# Patient Record
Sex: Female | Born: 1955 | Race: White | Hispanic: No | Marital: Married | Smoking: Former smoker
Health system: Southern US, Community
[De-identification: ages and names within clinical notes are randomized; demographics above are authoritative.]

## PROBLEM LIST (undated history)

## (undated) DIAGNOSIS — K219 Gastro-esophageal reflux disease without esophagitis: Secondary | ICD-10-CM

## (undated) DIAGNOSIS — G589 Mononeuropathy, unspecified: Secondary | ICD-10-CM

## (undated) DIAGNOSIS — H409 Unspecified glaucoma: Secondary | ICD-10-CM

## (undated) DIAGNOSIS — C539 Malignant neoplasm of cervix uteri, unspecified: Secondary | ICD-10-CM

## (undated) DIAGNOSIS — F419 Anxiety disorder, unspecified: Secondary | ICD-10-CM

## (undated) DIAGNOSIS — K5792 Diverticulitis of intestine, part unspecified, without perforation or abscess without bleeding: Secondary | ICD-10-CM

## (undated) DIAGNOSIS — M1990 Unspecified osteoarthritis, unspecified site: Secondary | ICD-10-CM

## (undated) HISTORY — DX: Unspecified osteoarthritis, unspecified site: M19.90

## (undated) HISTORY — PX: HAND SURGERY: SHX662

## (undated) HISTORY — DX: Gastro-esophageal reflux disease without esophagitis: K21.9

## (undated) HISTORY — DX: Unspecified glaucoma: H40.9

## (undated) HISTORY — PX: EYE SURGERY: SHX253

## (undated) HISTORY — PX: ABDOMINAL HYSTERECTOMY: SHX81

## (undated) HISTORY — PX: OTHER SURGICAL HISTORY: SHX169

---

## 2001-02-12 ENCOUNTER — Emergency Department (HOSPITAL_COMMUNITY): Admission: EM | Admit: 2001-02-12 | Discharge: 2001-02-12 | Payer: Self-pay | Admitting: Emergency Medicine

## 2001-02-12 ENCOUNTER — Encounter: Payer: Self-pay | Admitting: Emergency Medicine

## 2002-11-07 ENCOUNTER — Emergency Department (HOSPITAL_COMMUNITY): Admission: EM | Admit: 2002-11-07 | Discharge: 2002-11-07 | Payer: Self-pay | Admitting: Emergency Medicine

## 2002-11-07 ENCOUNTER — Encounter: Payer: Self-pay | Admitting: Emergency Medicine

## 2004-09-12 ENCOUNTER — Ambulatory Visit (HOSPITAL_COMMUNITY): Admission: RE | Admit: 2004-09-12 | Discharge: 2004-09-12 | Payer: Self-pay | Admitting: Obstetrics and Gynecology

## 2005-03-20 ENCOUNTER — Emergency Department (HOSPITAL_COMMUNITY): Admission: EM | Admit: 2005-03-20 | Discharge: 2005-03-20 | Payer: Self-pay | Admitting: Emergency Medicine

## 2005-06-03 ENCOUNTER — Emergency Department (HOSPITAL_COMMUNITY): Admission: EM | Admit: 2005-06-03 | Discharge: 2005-06-03 | Payer: Self-pay | Admitting: Emergency Medicine

## 2006-02-21 ENCOUNTER — Ambulatory Visit (HOSPITAL_COMMUNITY): Admission: RE | Admit: 2006-02-21 | Discharge: 2006-02-21 | Payer: Self-pay | Admitting: General Surgery

## 2007-03-11 ENCOUNTER — Encounter: Payer: Self-pay | Admitting: Obstetrics & Gynecology

## 2007-03-11 ENCOUNTER — Inpatient Hospital Stay (HOSPITAL_COMMUNITY): Admission: RE | Admit: 2007-03-11 | Discharge: 2007-03-13 | Payer: Self-pay | Admitting: Obstetrics & Gynecology

## 2007-11-08 ENCOUNTER — Emergency Department (HOSPITAL_COMMUNITY): Admission: EM | Admit: 2007-11-08 | Discharge: 2007-11-08 | Payer: Self-pay | Admitting: Emergency Medicine

## 2007-12-02 ENCOUNTER — Ambulatory Visit: Payer: Self-pay | Admitting: Internal Medicine

## 2008-01-01 ENCOUNTER — Ambulatory Visit (HOSPITAL_COMMUNITY): Admission: RE | Admit: 2008-01-01 | Discharge: 2008-01-01 | Payer: Self-pay | Admitting: Internal Medicine

## 2008-01-01 ENCOUNTER — Ambulatory Visit: Payer: Self-pay | Admitting: Internal Medicine

## 2010-10-30 NOTE — Op Note (Signed)
Allison Dickerson, Allison Dickerson              ACCOUNT NO.:  [id number]   MEDICAL RECORD NO.:  [id number]          PATIENT TYPE:  INP   LOCATION:  A310                          FACILITY:  [facility]   PHYSICIAN:  Lazaro Arms, M.D.   DATE OF BIRTH:  1955-07-08   DATE OF PROCEDURE:  03/11/2007  DATE OF DISCHARGE:                               OPERATIVE REPORT   PREOPERATIVE DIAGNOSES:  1. Pelvic pain.  2. Fibroid uterus.   POSTOPERATIVE DIAGNOSES:  1. Pelvic pain.  2. Fibroid uterus.   PROCEDURE:  TAH-BSO.   SURGEON:  Lazaro Arms, MD.   ANESTHESIA:  General endotracheal.   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  The uterus otherwise appeared to be normal.  The ovaries were  normal postmenopausal in appearance.   DESCRIPTION OF OPERATION:  The patient was taken to the operating room  and placed in the supine position where she underwent general  endotracheal anesthesia.  The vagina was prepped, a Foley catheter was  placed, the abdomen was prepped and draped in the usual sterile fashion.  A mini-lap Pfannenstiel incision was made, carried down sharply to the  rectus fascia, scored in the midline, and extended laterally.  The  fascia was taken off of the muscle superiorly and inferiorly, the  muscles were divided.  Peritoneal cavity was entered.  A medium-sized  Alexis self-retaining wound retractor was placed, the upper abdomen was  packed away, the uterine cornua were grasped, the left round ligament  was suture-ligated and cut.  The infundibulopelvic ligament on the left  was isolated, clamped, cut, and suture ligated.  The right round  ligament was suture-ligated and cut.  The infundibulopelvic ligament on  the right was isolated, clamped, cut, and double suture-ligated.  The  vesicouterine serosal flap was created and pushed down off the lower  uterine segment without difficulty.  The uterine vessels were clamped,  cut, and suture-ligated,  and the right broad ligament myoma was removed  so as not to get too lateral, and there was good hemostasis there.  Serial pedicles taken down the cervix through the cardinal ligament,  each pedicle being clamped, cut, and suture-ligated, the vagina was  cross-clamped, the specimen was removed, vaginal angle sutures were  placed, the vagina was closed with interrupted figure-of-eight sutures.  The pelvis was irrigated vigorously, all pedicles found to be  hemostatic, Interceed placed over the vaginal cuff.  The wound retractor  and the clamps were removed, the muscles and peritoneum reapproximated  loosely, the fascia closed using #0 Vicryl running, the subcutaneous  tissue was made hemostatic and irrigated.  The skin was closed using #3-  0 Vicryl in a subcuticular  fashion on a Keith needle.  A Dermabond was placed over for bandage and  additional skin reapproximation.  The patient was awakened from  anesthesia and taken to the recovery room in good, stable condition.  All counts were correct.  She received Ancef prophylactically.      Lazaro Arms, M.D.  Electronically Signed     LHE/MEDQ  D:  03/11/2007  T:  03/11/2007  Job:  [id number]

## 2010-10-30 NOTE — Op Note (Signed)
Allison Dickerson, Allison Dickerson              ACCOUNT NO.:  [id number]   MEDICAL RECORD NO.:  [id number]          PATIENT TYPE:  AMB   LOCATION:  DAY                           FACILITY:  [facility]   PHYSICIAN:  R. Roetta Sessions, M.D. DATE OF BIRTH:  [birth year]   DATE OF PROCEDURE:  01/01/2008  DATE OF DISCHARGE:                               OPERATIVE REPORT   INDICATIONS FOR PROCEDURE:  A 55 year old lady with history of  refractory reflux symptoms.  She has failed multiple acid suppressant  agents; however, she was switched to Aciphex 20 mg orally daily when  seen her in our office back on December 02, 2007, this has been associated  with marked dramatic improvement in her symptoms.  She has never had her  lower GI tract evaluated.  There was no family history of colorectal  neoplasia.  She has never had a colonoscopy.  EGD colonoscopy is now  being done.  This approach was discussed with the patient at length.  Risks, benefits, alternatives, and limitations have been reviewed,  questions answered.  She is agreeable.  Please see documentation in  medical record.   PROCEDURE NOTE:  O2 saturation, blood pressure, pulse, and respirations  were monitored throughout the entirety of the procedures.   CONSCIOUS SEDATION:  Versed 8 mg IV and Demerol 150 mg IV in divided  doses.  Cetacaine spray for topical oropharyngeal anesthesia.   INSTRUMENT:  Pentax video chip system.   FINDINGS:  EGD examination of tubular esophagus revealed no mucosal  abnormalities.  EG junction easily traversed.  The stomach:  Gastric  cavity was emptied and insufflated well with air.  Thorough examination  of the gastric mucosa including retroflex view of the proximal stomach  and esophagogastric junction demonstrated newly small hiatal hernia.  Pylorus patent and easily traversed.  Examination of the bulb and second  portion revealed no abnormalities.   THERAPEUTIC/DIAGNOSTIC MANEUVERS PERFORMED:  None.   The patient  tolerated the procedure well and was prepared for  colonoscopy.  Digital rectal exam revealed no abnormalities.  Endoscopic  findings:  The prep was adequate.  Colon:  Colonic mucosa was surveyed  from the rectosigmoid junction through the left transverse, right colon  to the appendiceal orifice, ileocecal valve, and cecum.  These  structures well seen and photographed for the record.  From this level,  scope was slowly cautiously withdrawn.  All previously mentioned mucosal  surfaces were again seen.  The colonic mucosa appeared entirely normal.  The scope was pulled down to the rectum with examination of rectal  mucosa, including retroflexed view of the anal verge, demonstrated no  abnormalities.  The patient tolerated the above procedures well and was  reacted in Endoscopy.   IMPRESSION:  Normal esophagus, small hiatal hernia, otherwise normal  stomach D1 and D2.  The colonoscopy findings, normal rectum.   RECOMMENDATIONS:  1. Literature on reflux and a hiatal hernia provided to Ms. Raisch.      Continue Aciphex 20 mg orally daily.  2. Repeat screening colonoscopy 10 years.  3. Followup appointment with me in 6 months.  Jonathon Bellows, M.D.  Electronically Signed     RMR/MEDQ  D:  01/01/2008  T:  01/01/2008  Job:  [id number]   cc:   Jeoffrey Massed, MD  Fax: [phone]

## 2010-10-30 NOTE — H&P (Signed)
Dickerson, Allison              ACCOUNT NO.:  [id number]   MEDICAL RECORD NO.:  [id number]          PATIENT TYPE:  AMB   LOCATION:  DAY                           FACILITY:  [facility]   PHYSICIAN:  Lazaro Arms, M.D.   DATE OF BIRTH:  [birth year]   DATE OF ADMISSION:  03/11/2007  DATE OF DISCHARGE:  09/26/2008LH                              HISTORY & PHYSICAL   Allison Dickerson is a 55 year old white female, gravida 1 para 1 aborta 0, who  had been seen in the office as recently as September of 2007, at which  time she was on examination known to have fibroids which were about 10-  weeks size.  She also has had in the past peri-menstrual bleeding but is  now post menopausal.  She was having a large amount of abdominal pain  and saw Dr. Lovell Sheehan because she thought it was GI in the left lower  quadrant and had a CT scan which showed the fibroids.  The patient and I  discussed options and really because of her pain and the fibroids to be  sure there were not any other options, is set to go for definitive  therapy with hysterectomy.  She understands and agrees with that  assessment and we will proceed.   PAST MEDICAL HISTORY:  Significant for a cold knife cone back in 1990.  She had carcinoma in situ.  In 1997 she had a broken ankle, and of course she has also had the cold  knife cone.   REVIEW OF SYSTEMS:  Negative.  She has had a vaginal delivery.   ALLERGIES:  None.   MEDICATIONS:  Lorazepam p.r.n.   PHYSICAL EXAMINATION:  HEENT:  Unremarkable.  CHEST:  Clear.  HEART:  Regular rate and rhythm, without murmurs, rubs or gallops.  BREAST:  Without mass, discharge, or skin changes.  ABDOMEN:  Benign, without hepatosplenomegaly or masses.  GU:  She has normal external genitalia.  Vagina pink and moist without  discharge.  Cervix is parous without lesions.  Her last Pap smear was  normal.  Uterus is 10-week size.  Fibroids palpated, adnexa is negative.  EXTREMITIES:  No edema.  NEUROLOGIC:   Grossly intact.   IMPRESSION:  1. Pelvic pain.  2. Fibroids.   PLAN:  The patient is admitted for TAHBSO.  She understands the risks  and benefits, indications and alternatives.  We will proceed.      Lazaro Arms, M.D.  Electronically Signed     LHE/MEDQ  D:  03/10/2007  T:  03/10/2007  Job:  [id number]

## 2010-10-30 NOTE — Consult Note (Signed)
NAMEYESICA, KEMLER              ACCOUNT NO.:  [id number]   MEDICAL RECORD NO.:  [id number]          PATIENT TYPE:  AMB   LOCATION:  DAY                           FACILITY:  [facility]   PHYSICIAN:  R. Roetta Sessions, M.D. DATE OF BIRTH:  03/17/1956   DATE OF CONSULTATION:  DATE OF DISCHARGE:  11/08/2007                                 CONSULTATION   REASON FOR CONSULTATION:  Refractory heartburn/atypical chest pain and  throat pain.   HISTORY OF PRESENT ILLNESS:  Ms. Bonfiglio is a 55 year old Caucasian  female.  She complains of a constant fire points to her upper chest  and neck.  She tells me this pain never goes away.  It feels as though  it is a pressure as well.  She has tried omeprazole 20 mg b.i.d., as  well as ranitidine 150 mg b.i.d. concomitantly, which has not helped.  She admits to taking some of her husband's Aciphex 20 mg daily and says  that she gets the most relief from this.  She has been on the Zantac and  omeprazole for about 3 months now.  Her symptoms are usually worse first  thing in the morning.  She occasionally awakens while sleeping with  symptoms nocturnally.  She does have severe water brash.  She tells me  she has severe halitosis and her water brash tastes like this.  She  complains of nausea.  She denies any vomiting.  Her symptoms have been  worse since she has been on estrogen replacement, status post  hysterectomy.  She does occasionally have some epigastric discomfort as  well.  Her throat pain never goes away.  Occasionally, she does have  symptoms, which radiate through to her back.  She has had constipation  the last couple of days.  Denies any history of diarrhea, rectal  bleeding, or melena.  She does have chills, but denies any fever.  She  denies any history of hot flash.  Her weight has steadily increased.  She was on antibiotics about 4 months ago.   PAST MEDICAL HISTORY AND PAST SURGICAL HISTORY:  Glaucoma, anxiety,  and  gastroesophageal  reflux disease.  Complete hysterectomy in September  2008 for uterine fibroids.   CURRENT MEDICATIONS:  1. Ranitidine 150 mg b.i.d.  2. Omeprazole 20 mg daily to b.i.d.  3. Estradiol 0.5 mg daily.  4. Lorazepam 2 mg t.i.d. p.r.n.   ALLERGIES:  No known drug allergies.   FAMILY HISTORY:  No known family history of water brash or liver  problems.  Her father was diagnosed and later passed away of pancreatic  carcinoma at age 50.  Mother is 109 and healthy.  She has 2 healthy  siblings.   SOCIAL HISTORY:  Ms. Payson is married.  She has one 29 year old son.  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  She  has a remote history of tobacco use, quit 1 month ago, generally  consumes anywhere between one or couple of cigarettes per day  previously.  Denies any alcohol or drug use.   REVIEW OF SYSTEMS:  See  HPI.  HEENT:  She does complain of frequent sore throat, frequent throat  clearing.  No history of laryngitis.  PULMONARY:  She does have cough with at times productive green sputum.  She does have shortness of breath while resting, usually improves with  exertion.   PHYSICAL EXAMINATION:  VITAL SIGNS:  Weight 125 pounds, height 66  inches, temperature 98.7, blood pressure 100/64, and pulse 84.  GENERAL:  Ms. Osuna is a well-developed, well-nourished Caucasian  female in no acute distress.  HEENT:  Sclerae clear and anicteric.  Conjunctivae pink.  OROPHARYNX:  Pink and moist without lesions.  NECK:  Supple without any masses or thyromegaly.  HEART:  Regular rhythm.  Normal S1 S2.  No murmurs, rubs, clicks, or  gallops.  LUNGS:  Clear to auscultation bilaterally.  ABDOMEN:  Positive bowel sounds x4.  No bruits auscultated.  Soft,  nontender, non-distended without palpable mass or hepatosplenomegaly.  No tenderness or guarding.  EXTREMITIES:  Without clubbing or edema bilaterally.  SKIN:  Pink, warm, and dry without any rash or jaundice.   IMPRESSION:  Ms.  Sefcik is a 55 year old Caucasian female with an  atypical chest pain, refractory heartburn, water brash, pharyngeal pain,  and cough.  I suspect some of her symptoms could be due to poorly  controlled gastroesophageal reflux disease or erosive esophagitis.  Recent antibiotic use make symptoms suspicious for Candida esophagitis  all of which needs to be ruled out via EGD.  If etiology of her symptoms  are unclear, would pursue further workup via ENT as well as further  workup of her pulmonary symptoms.   She has never had a screening colonoscopy, and this can be done at the  same time of EGD.   PLAN:  1. EGD and screening colonoscopy with Dr. Jena Gauss in the near future.      Discussed the procedure including risks and benefits, which include      but not limited to bleeding, infection, perforation, and drug      reaction.  She agrees and informed consent was obtained.  2. Discontinue ranitidine and omeprazole.  3. Begin Aciphex 20 mg daily.  I have given her a box of samples and a      prescription for #30 with 2 refills.      Lorenza Burton, N.P.      Jonathon Bellows, M.D.  Electronically Signed    KJ/MEDQ  D:  12/02/2007  T:  12/03/2007  Job:  [id number]

## 2010-10-30 NOTE — Discharge Summary (Signed)
NAMESHACOYA, BURKHAMMER              ACCOUNT NO.:  [id number]   MEDICAL RECORD NO.:  [id number]          PATIENT TYPE:  INP   LOCATION:  A310                          FACILITY:  [facility]   PHYSICIAN:  Lazaro Arms, M.D.   DATE OF BIRTH:  October 02, 1955   DATE OF ADMISSION:  03/11/2007  DATE OF DISCHARGE:  09/26/2008LH                               DISCHARGE SUMMARY   DISCHARGE DIAGNOSES:  1. Status post total abdominal hysterectomy/bilateral salpingo-      oophorectomy.  2. Unremarkable postoperative course.   PROCEDURE:  TAH/BSO.   HISTORY AND HOSPITAL COURSE:  Please refer to the history and physical,  the charts from the hospital as well as the operative report.   The patient was admitted after surgery.  She tolerated clear liquids and  a regular diet.  She voided without symptoms.  She is kind of focused on  her GI function.  She was passing gas, having bowel movement but she is  evidently very focused on it at home at well.  In any event, she  responded to suppositories and enemas.  She is ambulatory.  Her incision  is clean, dry and intact.  Her abdominal exam is completely benign.  She  is discharged to home the morning of postoperative day #2, doing well on  Percocet, Motrin and Cipro.  She did have a temperature of 100.2.  She  does smoke, is having some cough.  No atelectasis on exam.  We will see  her back in the office on Wednesday.      Lazaro Arms, M.D.  Electronically Signed     LHE/MEDQ  D:  03/13/2007  T:  03/13/2007  Job:  [id number]

## 2011-03-28 LAB — CROSSMATCH

## 2011-03-28 LAB — CBC
HCT: 28.6 — ABNORMAL LOW
HCT: 32.1 — ABNORMAL LOW
Hemoglobin: 9.9 — ABNORMAL LOW
MCHC: 34.2
MCV: 91.6
Platelets: 200
Platelets: 246
RBC: 3.12 — ABNORMAL LOW
RDW: 12.9
WBC: 15.4 — ABNORMAL HIGH
WBC: 8.4

## 2011-03-28 LAB — DIFFERENTIAL
Eosinophils Absolute: 0
Eosinophils Relative: 1
Lymphocytes Relative: 18
Lymphocytes Relative: 4 — ABNORMAL LOW
Lymphs Abs: 0.6 — ABNORMAL LOW
Lymphs Abs: 1.5
Monocytes Relative: 8
Neutro Abs: 13.8 — ABNORMAL HIGH
Neutrophils Relative %: 90 — ABNORMAL HIGH

## 2011-03-28 LAB — URINALYSIS, ROUTINE W REFLEX MICROSCOPIC
Glucose, UA: NEGATIVE
Nitrite: NEGATIVE
Protein, ur: NEGATIVE
Urobilinogen, UA: 0.2

## 2011-03-28 LAB — COMPREHENSIVE METABOLIC PANEL
ALT: 26
AST: 26
Calcium: 9.7
GFR calc Af Amer: >60
Sodium: 139
Total Protein: 6.6

## 2011-03-28 LAB — HCG, QUANTITATIVE, PREGNANCY: hCG, Beta Chain, Quant, S: 5 — ABNORMAL HIGH

## 2011-10-10 ENCOUNTER — Emergency Department (HOSPITAL_COMMUNITY)
Admission: EM | Admit: 2011-10-10 | Discharge: 2011-10-10 | Disposition: A | Discharge status: Home / Self Care | Payer: Self-pay | Attending: Emergency Medicine | Admitting: Emergency Medicine

## 2011-10-10 ENCOUNTER — Emergency Department (HOSPITAL_COMMUNITY): Payer: Self-pay

## 2011-10-10 ENCOUNTER — Encounter (HOSPITAL_COMMUNITY): Payer: Self-pay | Admitting: Emergency Medicine

## 2011-10-10 DIAGNOSIS — R51 Headache: Secondary | ICD-10-CM | POA: Insufficient documentation

## 2011-10-10 DIAGNOSIS — R42 Dizziness and giddiness: Secondary | ICD-10-CM | POA: Insufficient documentation

## 2011-10-10 DIAGNOSIS — F17200 Nicotine dependence, unspecified, uncomplicated: Secondary | ICD-10-CM | POA: Insufficient documentation

## 2011-10-10 HISTORY — DX: Malignant neoplasm of cervix uteri, unspecified: C53.9

## 2011-10-10 LAB — COMPREHENSIVE METABOLIC PANEL
AST: 21 U/L (ref 0–37)
Albumin: 3.8 g/dL (ref 3.5–5.2)
BUN: 16 mg/dL (ref 6–23)
CO2: 27 mEq/L (ref 19–32)
Calcium: 10.2 mg/dL (ref 8.4–10.5)
Creatinine, Ser: 0.94 mg/dL (ref 0.50–1.10)
GFR calc non Af Amer: 66 mL/min — ABNORMAL LOW (ref >90)
Total Bilirubin: 0.4 mg/dL (ref 0.3–1.2)

## 2011-10-10 LAB — CBC
HCT: 36.6 % (ref 36.0–46.0)
MCH: 31.5 pg (ref 26.0–34.0)
MCV: 93.6 fL (ref 78.0–100.0)
Platelets: 211 10*3/uL (ref 150–400)
RDW: 12.2 % (ref 11.5–15.5)

## 2011-10-10 LAB — URINALYSIS, ROUTINE W REFLEX MICROSCOPIC
Hgb urine dipstick: NEGATIVE
Leukocytes, UA: NEGATIVE
Nitrite: NEGATIVE
Protein, ur: NEGATIVE mg/dL
Specific Gravity, Urine: 1.015 (ref 1.005–1.030)
Urobilinogen, UA: 0.2 mg/dL (ref 0.0–1.0)

## 2011-10-10 LAB — TROPONIN I: Troponin I: <0.3 ng/mL (ref <0.30)

## 2011-10-10 MED ORDER — DOXYCYCLINE HYCLATE 100 MG PO CAPS: 100.0000 mg | ORAL_CAPSULE | Freq: Two times a day (BID) | ORAL | Status: AC

## 2011-10-10 MED ORDER — SODIUM CHLORIDE 0.9 % IV SOLN
INTRAVENOUS | Status: DC
  Administered 2011-10-10: 100 mL via INTRAVENOUS

## 2011-10-10 MED ORDER — SODIUM CHLORIDE 0.9 % IV BOLUS (SEPSIS)
250.0000 mL | Freq: Once | INTRAVENOUS | Status: AC
  Administered 2011-10-10: 250 mL via INTRAVENOUS

## 2011-10-10 NOTE — Discharge Instructions (Signed)
Workup negative in the emergency department. Will treat for possibly early lines disease with antibiotic take as tract in until finished. Followup with your record Dr. If not improved in 2 days return for any new or worse symptoms.

## 2011-10-10 NOTE — ED Notes (Signed)
Patient with c/o dizziness and headache that started about 2 days ago with neck pain. Patient reports she feels "off". Patient alert/oriented x 4. States she feels like she is going to pass out. Reports insect bite to left neck at base of skull after doing yard work.

## 2011-10-10 NOTE — ED Provider Notes (Signed)
History     CSN: [id number]  Arrival date & time 10/10/11  1336   First MD Initiated Contact with Patient 10/10/11 1355      Chief Complaint  Patient presents with  . Dizziness  . Headache    (Consider location/radiation/quality/duration/timing/severity/associated sxs/prior treatment) The history is provided by the patient and the spouse.  patient is a 56 year old female with 2 day history of headache dizziness and feeling like she may pass out. Patient did have a insect bite or tick bite to the left neck the base of the skull after doing yard work a few days ago. She has some hardness there and some tenderness there. Patient denies fever denies sore throat cough congestion chest pain shortness of breath or any neurological findings nausea vomiting diarrhea or abdominal pain.  Past Medical History  Diagnosis Date  . Cervical cancer     When younger.    Past Surgical History  Procedure Date  . Abdominal hysterectomy   . Cervical cancer removal     No family history on file.  History  Substance Use Topics  . Smoking status: Current Everyday Smoker -- 0.5 packs/day  . Smokeless tobacco: Not on file  . Alcohol Use: Yes     occasionally    OB History    Grav Para Term Preterm Abortions TAB SAB Ect Mult Living                  Review of Systems  Constitutional: Negative for fever, chills and diaphoresis.  HENT: Positive for neck pain and neck stiffness. Negative for congestion, sore throat and sinus pressure.   Eyes: Negative for redness and visual disturbance.  Respiratory: Negative for cough and shortness of breath.   Cardiovascular: Negative for chest pain, palpitations and leg swelling.  Gastrointestinal: Negative for nausea, vomiting, abdominal pain and diarrhea.  Genitourinary: Negative for dysuria.  Musculoskeletal: Negative for myalgias and back pain.  Skin: Negative for rash.  Neurological: Positive for dizziness, light-headedness and headaches. Negative  for tremors, seizures, syncope, speech difficulty, weakness and numbness.  Hematological: Does not bruise/bleed easily.    Allergies  Review of patient's allergies indicates no known allergies.  Home Medications   Current Outpatient Rx  Name Route Sig Dispense Refill  . LORAZEPAM 0.5 MG PO TABS Oral Take 0.5 mg by mouth every 8 (eight) hours.    Marland Kitchen DOXYCYCLINE HYCLATE 100 MG PO CAPS Oral Take 1 capsule (100 mg total) by mouth 2 (two) times daily. 20 capsule 0    BP 111/69  Pulse 67  Temp(Src) 98 F (36.7 C) (Oral)  Resp 18  Ht 5\' 7"  (1.702 m)  Wt 120 lb (54.432 kg)  BMI 18.79 kg/m2  SpO2 100%  Physical Exam  Nursing note and vitals reviewed. Constitutional: She is oriented to person, place, and time. She appears well-developed and well-nourished.  HENT:  Head: Normocephalic and atraumatic.  Mouth/Throat: Oropharynx is clear and moist.  Eyes: Conjunctivae and EOM are normal. Pupils are equal, round, and reactive to light.  Neck: Normal range of motion. Neck supple.       Left base of neck with area of insect bite with about 1 cm of induration no erythema no purulent discharge no cervical adenopathy. Could be consistent with an insect bite. No evidence of remnant of the body.  Cardiovascular: Normal rate, regular rhythm, normal heart sounds and intact distal pulses.   No murmur heard. Pulmonary/Chest: Effort normal and breath sounds normal. No respiratory distress.  Abdominal:  Soft. Bowel sounds are normal. There is no tenderness.  Musculoskeletal: Normal range of motion. She exhibits no edema and no tenderness.  Lymphadenopathy:    She has no cervical adenopathy.  Neurological: She is oriented to person, place, and time. No cranial nerve deficit. She exhibits normal muscle tone. Coordination normal.  Skin: Skin is warm. No rash noted. No erythema.    ED Course  Procedures (including critical care time)  Labs Reviewed  COMPREHENSIVE METABOLIC PANEL - Abnormal; Notable  for the following:    Glucose, Bld 112 (*)    GFR calc non Af Amer 66 (*)    GFR calc Af Amer 77 (*)    All other components within normal limits  CBC  URINALYSIS, ROUTINE W REFLEX MICROSCOPIC  TROPONIN I   Results for orders placed during the hospital encounter of 10/10/11  CBC      Component Value Range   WBC 7.1  4.0 - 10.5 (K/uL)   RBC 3.91  3.87 - 5.11 (MIL/uL)   Hemoglobin 12.3  12.0 - 15.0 (g/dL)   HCT 69.6  29.5 - 28.4 (%)   MCV 93.6  78.0 - 100.0 (fL)   MCH 31.5  26.0 - 34.0 (pg)   MCHC 33.6  30.0 - 36.0 (g/dL)   RDW 13.2  44.0 - 10.2 (%)   Platelets 211  150 - 400 (K/uL)  COMPREHENSIVE METABOLIC PANEL      Component Value Range   Sodium 138  135 - 145 (mEq/L)   Potassium 3.9  3.5 - 5.1 (mEq/L)   Chloride 103  96 - 112 (mEq/L)   CO2 27  19 - 32 (mEq/L)   Glucose, Bld 112 (*) 70 - 99 (mg/dL)   BUN 16  6 - 23 (mg/dL)   Creatinine, Ser 7.25  0.50 - 1.10 (mg/dL)   Calcium 36.6  8.4 - 10.5 (mg/dL)   Total Protein 6.8  6.0 - 8.3 (g/dL)   Albumin 3.8  3.5 - 5.2 (g/dL)   AST 21  0 - 37 (U/L)   ALT 24  0 - 35 (U/L)   Alkaline Phosphatase 46  39 - 117 (U/L)   Total Bilirubin 0.4  0.3 - 1.2 (mg/dL)   GFR calc non Af Amer 66 (*) >90 (mL/min)   GFR calc Af Amer 77 (*) >90 (mL/min)  URINALYSIS, ROUTINE W REFLEX MICROSCOPIC      Component Value Range   Color, Urine YELLOW  YELLOW    APPearance CLEAR  CLEAR    Specific Gravity, Urine 1.015  1.005 - 1.030    pH 5.5  5.0 - 8.0    Glucose, UA NEGATIVE  NEGATIVE (mg/dL)   Hgb urine dipstick NEGATIVE  NEGATIVE    Bilirubin Urine NEGATIVE  NEGATIVE    Ketones, ur NEGATIVE  NEGATIVE (mg/dL)   Protein, ur NEGATIVE  NEGATIVE (mg/dL)   Urobilinogen, UA 0.2  0.0 - 1.0 (mg/dL)   Nitrite NEGATIVE  NEGATIVE    Leukocytes, UA NEGATIVE  NEGATIVE   TROPONIN I      Component Value Range   Troponin I <0.30  <0.30 (ng/mL)    Date: 10/10/2011  Rate: 71  Rhythm: normal sinus rhythm  QRS Axis: normal  Intervals: normal  ST/T Wave  abnormalities: normal  Conduction Disutrbances:none  Narrative Interpretation:   Old EKG Reviewed: none available   Dg Chest 2 View  10/10/2011  *RADIOLOGY REPORT*  Clinical Data: Near-syncope.  Dizziness  CHEST - 2 VIEW  Comparison: 03/20/2005  Findings: The heart size and mediastinal contours are within normal limits.  Both lungs are clear.  The visualized skeletal structures are unremarkable.  IMPRESSION: Negative exam.  Original Report Authenticated By: Rosealee Albee, M.D.   Ct Head Wo Contrast  10/10/2011  *RADIOLOGY REPORT*  Clinical Data: Dizziness and headache for 2 days.  CT HEAD WITHOUT CONTRAST  Technique:  Contiguous axial images were obtained from the base of the skull through the vertex without contrast.  Comparison: None.  Findings: The brain appears normal without evidence of infarction, hemorrhage, mass lesion, mass effect, midline shift or abnormal extra-axial fluid collection.  No hydrocephalus or pneumocephalus. Mild mucosal thickening right sphenoid sinus is noted.  There is also a small amount of fluid in the left mastoid air cells.  IMPRESSION: No acute intracranial abnormality.  Original Report Authenticated By: Bernadene Bell. D'ALESSIO, M.D.     1. Dizziness       MDM      Workup for symptoms without specific findings. Patient did have some sort of bug bite questionable tick bite to the left side of her neck a few days ago. Does have a slight bit of induration and tenderness there. Workup for near-syncope without evidence of head CT abnormality troponin was normal EKG without acute changes chest x-ray negative lab workup completely normal to include liver function tests. Symptoms could be early Lyme's will treat with doxycycline. Patient knows to return for any new or worse symptoms and to followup with her primary care Dr. If not better in 2 days.dizziness not consistent with true vertigo.    Shelda Jakes, MD 10/10/11 1640

## 2012-08-15 ENCOUNTER — Encounter (HOSPITAL_COMMUNITY): Payer: Self-pay | Admitting: Emergency Medicine

## 2012-08-15 ENCOUNTER — Emergency Department (HOSPITAL_COMMUNITY)
Admission: EM | Admit: 2012-08-15 | Discharge: 2012-08-15 | Disposition: A | Discharge status: Home / Self Care | Payer: Self-pay | Attending: Emergency Medicine | Admitting: Emergency Medicine

## 2012-08-15 DIAGNOSIS — Z8541 Personal history of malignant neoplasm of cervix uteri: Secondary | ICD-10-CM | POA: Insufficient documentation

## 2012-08-15 DIAGNOSIS — M25531 Pain in right wrist: Secondary | ICD-10-CM

## 2012-08-15 DIAGNOSIS — M79641 Pain in right hand: Secondary | ICD-10-CM

## 2012-08-15 DIAGNOSIS — M25539 Pain in unspecified wrist: Secondary | ICD-10-CM | POA: Insufficient documentation

## 2012-08-15 DIAGNOSIS — M7989 Other specified soft tissue disorders: Secondary | ICD-10-CM | POA: Insufficient documentation

## 2012-08-15 DIAGNOSIS — M79609 Pain in unspecified limb: Secondary | ICD-10-CM | POA: Insufficient documentation

## 2012-08-15 DIAGNOSIS — F17200 Nicotine dependence, unspecified, uncomplicated: Secondary | ICD-10-CM | POA: Insufficient documentation

## 2012-08-15 DIAGNOSIS — R209 Unspecified disturbances of skin sensation: Secondary | ICD-10-CM | POA: Insufficient documentation

## 2012-08-15 MED ORDER — HYDROCODONE-ACETAMINOPHEN 5-325 MG PO TABS: 1.0000 | ORAL_TABLET | ORAL | Status: DC | PRN

## 2012-08-15 MED ORDER — HYDROCODONE-ACETAMINOPHEN 5-325 MG PO TABS
1.0000 | ORAL_TABLET | Freq: Once | ORAL | Status: AC
  Administered 2012-08-15: 1 via ORAL
  Filled 2012-08-15: qty 1

## 2012-08-15 MED ORDER — PREDNISONE 10 MG PO TABS: 20.0000 mg | ORAL_TABLET | Freq: Every day | ORAL | Status: DC

## 2012-08-15 MED ORDER — PREDNISONE 50 MG PO TABS
60.0000 mg | ORAL_TABLET | Freq: Once | ORAL | Status: AC
  Administered 2012-08-15: 60 mg via ORAL
  Filled 2012-08-15: qty 1

## 2012-08-15 NOTE — ED Notes (Signed)
Pt complains of pain, numbness and swelling to her right hand, states the pain has been going on for 2 days and has been getting worse. Moderate swelling noted to the affected hand, pt denies injury. Pt aax4 nad noted.

## 2012-08-15 NOTE — ED Provider Notes (Signed)
History     CSN: [id number]  Arrival date & time 08/15/12  1478   First MD Initiated Contact with Patient 08/15/12 [phone]      Chief Complaint  Patient presents with  . Hand Pain  . Arm Pain    (Consider location/radiation/quality/duration/timing/severity/associated sxs/prior treatment) HPI Allison Dickerson is a 57 y.o. female who presents to the Emergency Department complaining of pain , some numbness and swelling to her right hand with pain to the anterior wrist area x 2 days after vacuuming using an older vacuum. Patient has arthritis and has not had swelling or pain this bad before.  PCP Dr. Milford Cage  Past Medical History  Diagnosis Date  . Cervical cancer     When younger.    Past Surgical History  Procedure Laterality Date  . Abdominal hysterectomy    . Cervical cancer removal      History reviewed. No pertinent family history.  History  Substance Use Topics  . Smoking status: Current Every Day Smoker -- 0.50 packs/day  . Smokeless tobacco: Not on file  . Alcohol Use: Yes     Comment: occasionally    OB History   Grav Para Term Preterm Abortions TAB SAB Ect Mult Living                  Review of Systems  Constitutional: Negative for fever.       10 Systems reviewed and are negative for acute change except as noted in the HPI.  HENT: Negative for congestion.   Eyes: Negative for discharge and redness.  Respiratory: Negative for cough and shortness of breath.   Cardiovascular: Negative for chest pain.  Gastrointestinal: Negative for vomiting and abdominal pain.  Musculoskeletal: Negative for back pain.       Wrist and hand swelling and pain  Skin: Negative for rash.  Neurological: Negative for syncope, numbness and headaches.  Psychiatric/Behavioral:       No behavior change.    Allergies  Review of patient's allergies indicates no known allergies.  Home Medications   Current Outpatient Rx  Name  Route  Sig  Dispense  Refill  . LORazepam (ATIVAN)  0.5 MG tablet   Oral   Take 0.5 mg by mouth every 8 (eight) hours.           BP 121/76  Pulse 71  Temp(Src) 97.9 F (36.6 C) (Oral)  Resp 18  Ht 5\' 7"  (1.702 m)  Wt 125 lb (56.7 kg)  BMI 19.57 kg/m2  SpO2 100%  Physical Exam  Nursing note and vitals reviewed. Constitutional:  Awake, alert, nontoxic appearance.  HENT:  Head: Normocephalic and atraumatic.  Right Ear: External ear normal.  Left Ear: External ear normal.  Eyes: EOM are normal. Pupils are equal, round, and reactive to light.  Neck: Neck supple.  Cardiovascular: Normal rate.   Pulmonary/Chest: Effort normal and breath sounds normal. She exhibits no tenderness.  Abdominal: Soft. Bowel sounds are normal. There is no tenderness. There is no rebound.  Musculoskeletal: She exhibits no tenderness.  Right hand with numbness to the finger tips with light touch testing. Swelling and pain to the carpel tunnel area. Tenderness with palpation of the anterior wrist. Cap refill brisk.  Neurological:  Mental status and motor strength appears baseline for patient and situation.  Skin: No rash noted.  Psychiatric: She has a normal mood and affect.    ED Course  Procedures (including critical care time)     MDM  Patient  with swelling and numbness due to use of the right hand and wrist. Given prednisone, vicodin and placed in a wrist splint. Referral to ortho. Pt stable in ED with no significant deterioration in condition.The patient appears reasonably screened and/or stabilized for discharge and I doubt any other medical condition or other [facility] requiring further screening, evaluation, or treatment in the ED at this time prior to discharge.  MDM Reviewed: nursing note and vitals           Nicoletta Dress. Colon Stann, MD 08/15/12 [phone]

## 2013-05-27 ENCOUNTER — Emergency Department (HOSPITAL_COMMUNITY)
Admission: EM | Admit: 2013-05-27 | Discharge: 2013-05-27 | Disposition: A | Discharge status: Home / Self Care | Payer: Self-pay | Attending: Emergency Medicine | Admitting: Emergency Medicine

## 2013-05-27 ENCOUNTER — Emergency Department (HOSPITAL_COMMUNITY): Payer: Self-pay

## 2013-05-27 ENCOUNTER — Encounter (HOSPITAL_COMMUNITY): Payer: Self-pay | Admitting: Emergency Medicine

## 2013-05-27 DIAGNOSIS — Z8541 Personal history of malignant neoplasm of cervix uteri: Secondary | ICD-10-CM | POA: Insufficient documentation

## 2013-05-27 DIAGNOSIS — R5381 Other malaise: Secondary | ICD-10-CM | POA: Insufficient documentation

## 2013-05-27 DIAGNOSIS — R141 Gas pain: Secondary | ICD-10-CM | POA: Insufficient documentation

## 2013-05-27 DIAGNOSIS — R142 Eructation: Secondary | ICD-10-CM | POA: Insufficient documentation

## 2013-05-27 DIAGNOSIS — K5732 Diverticulitis of large intestine without perforation or abscess without bleeding: Secondary | ICD-10-CM | POA: Insufficient documentation

## 2013-05-27 DIAGNOSIS — K5900 Constipation, unspecified: Secondary | ICD-10-CM | POA: Insufficient documentation

## 2013-05-27 DIAGNOSIS — F17200 Nicotine dependence, unspecified, uncomplicated: Secondary | ICD-10-CM | POA: Insufficient documentation

## 2013-05-27 DIAGNOSIS — IMO0002 Reserved for concepts with insufficient information to code with codable children: Secondary | ICD-10-CM | POA: Insufficient documentation

## 2013-05-27 LAB — HEPATIC FUNCTION PANEL
Albumin: 3.7 g/dL (ref 3.5–5.2)
Alkaline Phosphatase: 59 U/L (ref 39–117)
Total Protein: 7 g/dL (ref 6.0–8.3)

## 2013-05-27 LAB — URINALYSIS, ROUTINE W REFLEX MICROSCOPIC
Ketones, ur: NEGATIVE mg/dL
Leukocytes, UA: NEGATIVE
Protein, ur: NEGATIVE mg/dL
Urobilinogen, UA: 0.2 mg/dL (ref 0.0–1.0)

## 2013-05-27 LAB — CBC WITH DIFFERENTIAL/PLATELET
Basophils Absolute: 0 10*3/uL (ref 0.0–0.1)
Basophils Relative: 0 % (ref 0–1)
Eosinophils Relative: 1 % (ref 0–5)
Lymphocytes Relative: 10 % — ABNORMAL LOW (ref 12–46)
MCHC: 34.2 g/dL (ref 30.0–36.0)
MCV: 94.8 fL (ref 78.0–100.0)
Platelets: 255 10*3/uL (ref 150–400)
RDW: 12.4 % (ref 11.5–15.5)
WBC: 11.1 10*3/uL — ABNORMAL HIGH (ref 4.0–10.5)

## 2013-05-27 LAB — BASIC METABOLIC PANEL
CO2: 25 mEq/L (ref 19–32)
Calcium: 9.6 mg/dL (ref 8.4–10.5)
Creatinine, Ser: 0.96 mg/dL (ref 0.50–1.10)
GFR calc Af Amer: 74 mL/min — ABNORMAL LOW (ref >90)
GFR calc non Af Amer: 63 mL/min — ABNORMAL LOW (ref >90)
Sodium: 137 mEq/L (ref 135–145)

## 2013-05-27 LAB — URINE MICROSCOPIC-ADD ON

## 2013-05-27 LAB — LIPASE, BLOOD: Lipase: 28 U/L (ref 11–59)

## 2013-05-27 MED ORDER — HYDROCODONE-ACETAMINOPHEN 5-325 MG PO TABS
2.0000 | ORAL_TABLET | Freq: Once | ORAL | Status: AC
  Administered 2013-05-27: 2 via ORAL
  Filled 2013-05-27: qty 2

## 2013-05-27 MED ORDER — METRONIDAZOLE 500 MG PO TABS
500.0000 mg | ORAL_TABLET | Freq: Once | ORAL | Status: AC
  Administered 2013-05-27: 500 mg via ORAL
  Filled 2013-05-27: qty 1

## 2013-05-27 MED ORDER — CIPROFLOXACIN HCL 500 MG PO TABS: 500.0000 mg | ORAL_TABLET | Freq: Two times a day (BID) | ORAL | Status: DC

## 2013-05-27 MED ORDER — PROMETHAZINE HCL 12.5 MG PO TABS
12.5000 mg | ORAL_TABLET | Freq: Once | ORAL | Status: AC
  Administered 2013-05-27: 12.5 mg via ORAL
  Filled 2013-05-27: qty 1

## 2013-05-27 MED ORDER — METRONIDAZOLE 500 MG PO TABS: 500.0000 mg | ORAL_TABLET | Freq: Two times a day (BID) | ORAL | Status: DC

## 2013-05-27 MED ORDER — CIPROFLOXACIN HCL 250 MG PO TABS
500.0000 mg | ORAL_TABLET | Freq: Once | ORAL | Status: AC
  Administered 2013-05-27: 500 mg via ORAL
  Filled 2013-05-27: qty 2

## 2013-05-27 MED ORDER — HYDROCODONE-ACETAMINOPHEN 7.5-325 MG PO TABS: 1.0000 | ORAL_TABLET | ORAL | Status: DC | PRN

## 2013-05-27 NOTE — ED Notes (Signed)
Pt alert & oriented x4, stable gait. Patient given discharge instructions, paperwork & prescription(s). Patient  instructed to stop at the registration desk to finish any additional paperwork. Patient verbalized understanding. Pt left department w/ no further questions. 

## 2013-05-27 NOTE — ED Notes (Signed)
Pt states lower abdominal pain (suprapubic area). States urinary frequency also. Symptoms x 3 days. States she also feels constipated. Nausea at times.

## 2013-05-27 NOTE — ED Notes (Signed)
Patient transported to X-ray 

## 2013-05-27 NOTE — ED Notes (Signed)
Pt is back from xray at this time. Allison Dickerson 

## 2013-05-27 NOTE — ED Provider Notes (Signed)
CSN: [id number]     Arrival date & time 05/27/13  9629 History   First MD Initiated Contact with Patient 05/27/13 [phone]     Chief Complaint  Patient presents with  . Abdominal Pain   (Consider location/radiation/quality/duration/timing/severity/associated sxs/prior Treatment) Patient is a 57 y.o. female presenting with abdominal pain. The history is provided by the patient.  Abdominal Pain Pain location:  Periumbilical, LLQ and RLQ Pain quality: fullness, sharp and shooting   Pain severity:  Severe Onset quality:  Gradual Duration:  3 days Timing:  Intermittent Progression:  Worsening Chronicity:  New Context: not recent travel and not suspicious food intake   Relieved by:  Nothing Ineffective treatments:  None tried Associated symptoms: fatigue, flatus and nausea   Associated symptoms: no chills, no dysuria, no fever, no hematemesis, no hematochezia, no hematuria, no shortness of breath and no vomiting   Risk factors: not obese, not pregnant and no recent hospitalization     Past Medical History  Diagnosis Date  . Cervical cancer     When younger.   Past Surgical History  Procedure Laterality Date  . Abdominal hysterectomy    . Cervical cancer removal     No family history on file. History  Substance Use Topics  . Smoking status: Current Every Day Smoker -- 0.50 packs/day    Types: Cigarettes  . Smokeless tobacco: Not on file  . Alcohol Use: Yes     Comment: occasionally   OB History   Grav Para Term Preterm Abortions TAB SAB Ect Mult Living                 Review of Systems  Constitutional: Positive for fatigue. Negative for fever and chills.  Respiratory: Negative for shortness of breath.   Gastrointestinal: Positive for nausea, abdominal pain and flatus. Negative for vomiting, hematochezia and hematemesis.  Genitourinary: Negative for dysuria and hematuria.    Allergies  Review of patient's allergies indicates no known allergies.  Home Medications    Current Outpatient Rx  Name  Route  Sig  Dispense  Refill  . HYDROcodone-acetaminophen (NORCO/VICODIN) 5-325 MG per tablet   Oral   Take 1 tablet by mouth every 4 (four) hours as needed for pain.   15 tablet   0   . LORazepam (ATIVAN) 0.5 MG tablet   Oral   Take 0.5 mg by mouth every 8 (eight) hours.         . predniSONE (DELTASONE) 10 MG tablet   Oral   Take 2 tablets (20 mg total) by mouth daily.   10 tablet   0    BP 139/61  Pulse 84  Temp(Src) 98.9 F (37.2 C) (Oral)  Resp 17  Ht 5\' 7"  (1.702 m)  Wt 130 lb (58.968 kg)  BMI 20.36 kg/m2  SpO2 99% Physical Exam  Nursing note and vitals reviewed. Constitutional: She is oriented to person, place, and time. She appears well-developed and well-nourished.  Non-toxic appearance.  HENT:  Head: Normocephalic.  Right Ear: Tympanic membrane and external ear normal.  Left Ear: Tympanic membrane and external ear normal.  Eyes: EOM and lids are normal. Pupils are equal, round, and reactive to light.  Neck: Normal range of motion. Neck supple. Carotid bruit is not present.  Cardiovascular: Normal rate, regular rhythm, normal heart sounds, intact distal pulses and normal pulses.   Pulmonary/Chest: Breath sounds normal. No respiratory distress.  Abdominal: Soft. Bowel sounds are normal. There is tenderness in the right lower quadrant, periumbilical area  and left lower quadrant. There is no guarding and no CVA tenderness.    Musculoskeletal: Normal range of motion.  Lymphadenopathy:       Head (right side): No submandibular adenopathy present.       Head (left side): No submandibular adenopathy present.    She has no cervical adenopathy.  Neurological: She is alert and oriented to person, place, and time. She has normal strength. No cranial nerve deficit or sensory deficit.  Skin: Skin is warm and dry.  Psychiatric: She has a normal mood and affect. Her speech is normal.    ED Course  Procedures (including critical care  time) Labs Review Labs Reviewed  URINALYSIS, ROUTINE W REFLEX MICROSCOPIC  CBC WITH DIFFERENTIAL  BASIC METABOLIC PANEL  HEPATIC FUNCTION PANEL  LIPASE, BLOOD   Imaging Review Dg Abd Acute W/chest  05/27/2013   CLINICAL DATA:  Low abdomen pain.  Constipated.  EXAM: ACUTE ABDOMEN SERIES (ABDOMEN 2 VIEW & CHEST 1 VIEW)  COMPARISON:  Chest x-ray October 10, 2011  FINDINGS: There is no evidence of dilated bowel loops or free intraperitoneal air. Bowel content is noted throughout colon. No radiopaque calculi or other significant radiographic abnormality is seen. Left-sided pelvic phlebolith is noted. Heart size and mediastinal contours are within normal limits. Both lungs are clear. Bilateral symmetrical nipple shadows are noted.  IMPRESSION: Negative abdominal radiographs. Constipation. No acute cardiopulmonary disease.   Electronically Signed   By: Sherian Rein M.D.   On: 05/27/2013 10:26    EKG Interpretation   None       MDM  No diagnosis found. *I have reviewed nursing notes, vital signs, and all appropriate lab and imaging results for this patient.**   Vital signs well within normal limits. Pulse oximetry is 99% on room air. Within normal limits by my interpretation. Acute abdomen x-rays are negative for dilated loops of bowel or free intraperitoneal air. There is evidence however of constipation. No acute cardiopulmonary disease noted. CBC reveals 11.1 wbc. Bmet wnl except 109 glucose. Hepatic function wnl. Lipase wnl. UA reveals 7-10 RBC and moderate Hgb on dipstick. CT abd/pelvis - neg for renal calculi, but suggest sigmoid diverticulitis. No abscess or free air. Pt placed on cipro and flagyl and norco 7.5 for pain. Pt to follow up with Dr Jena Gauss for additional evaluation of the diverticular diseasse.  Kathie Dike, PA-C 05/28/13 [phone]

## 2013-06-05 NOTE — ED Provider Notes (Signed)
Medical screening examination/treatment/procedure(s) were performed by non-physician practitioner and as supervising physician I was immediately available for consultation/collaboration.  EKG Interpretation   None         Acadia Thammavong J Jayani Rozman, MD 06/05/13 0657 

## 2013-08-21 ENCOUNTER — Encounter (HOSPITAL_COMMUNITY): Payer: Self-pay | Admitting: Emergency Medicine

## 2013-08-21 ENCOUNTER — Emergency Department (HOSPITAL_COMMUNITY): Payer: Self-pay

## 2013-08-21 ENCOUNTER — Emergency Department (HOSPITAL_COMMUNITY)
Admission: EM | Admit: 2013-08-21 | Discharge: 2013-08-21 | Disposition: A | Discharge status: Home / Self Care | Payer: Self-pay | Attending: Emergency Medicine | Admitting: Emergency Medicine

## 2013-08-21 DIAGNOSIS — F17200 Nicotine dependence, unspecified, uncomplicated: Secondary | ICD-10-CM | POA: Insufficient documentation

## 2013-08-21 DIAGNOSIS — F411 Generalized anxiety disorder: Secondary | ICD-10-CM | POA: Insufficient documentation

## 2013-08-21 DIAGNOSIS — M25511 Pain in right shoulder: Secondary | ICD-10-CM

## 2013-08-21 DIAGNOSIS — Z8541 Personal history of malignant neoplasm of cervix uteri: Secondary | ICD-10-CM | POA: Insufficient documentation

## 2013-08-21 DIAGNOSIS — Z8719 Personal history of other diseases of the digestive system: Secondary | ICD-10-CM | POA: Insufficient documentation

## 2013-08-21 DIAGNOSIS — M542 Cervicalgia: Secondary | ICD-10-CM | POA: Insufficient documentation

## 2013-08-21 DIAGNOSIS — Z7982 Long term (current) use of aspirin: Secondary | ICD-10-CM | POA: Insufficient documentation

## 2013-08-21 DIAGNOSIS — M25519 Pain in unspecified shoulder: Secondary | ICD-10-CM | POA: Insufficient documentation

## 2013-08-21 DIAGNOSIS — Z79899 Other long term (current) drug therapy: Secondary | ICD-10-CM | POA: Insufficient documentation

## 2013-08-21 HISTORY — DX: Anxiety disorder, unspecified: F41.9

## 2013-08-21 HISTORY — DX: Diverticulitis of intestine, part unspecified, without perforation or abscess without bleeding: K57.92

## 2013-08-21 MED ORDER — MENTHOL (TOPICAL ANALGESIC) 2.5 % EX GEL: 1.0000 "application " | Freq: Three times a day (TID) | CUTANEOUS | Status: DC | PRN

## 2013-08-21 MED ORDER — NAPROXEN 500 MG PO TABS: 500.0000 mg | ORAL_TABLET | Freq: Two times a day (BID) | ORAL | Status: DC

## 2013-08-21 MED ORDER — IBUPROFEN 800 MG PO TABS
800.0000 mg | ORAL_TABLET | Freq: Once | ORAL | Status: AC
  Administered 2013-08-21: 800 mg via ORAL
  Filled 2013-08-21: qty 1

## 2013-08-21 NOTE — ED Notes (Signed)
Patient c/o right shoulder pain that radiates into right arm. Per patient started with right knee pain and swelling Wednesday in which she used a knee brace. Per patient pain in knee better but now arm hurts. Patient states "I don't know if it's from pulling myself up while my knee hurt or what." Per patient some swelling noted in hand.

## 2013-08-21 NOTE — ED Provider Notes (Signed)
Medical screening examination/treatment/procedure(s) were performed by non-physician practitioner and as supervising physician I was immediately available for consultation/collaboration.   EKG Interpretation None       Merryl Hacker, MD 08/21/13 1556

## 2013-08-21 NOTE — Discharge Instructions (Signed)
Please follow up with your primary care physician in 1-2 days. If you do not have one please call the [facility] number listed above. Please take Naproxen as prescribed. Please use Menthol gel as prescribed to help with pain as well. Please follow RICE method below. Please read all discharge instructions and return precautions.   Shoulder Pain The shoulder is the joint that connects your arms to your body. The bones that form the shoulder joint include the upper arm bone (humerus), the shoulder blade (scapula), and the collarbone (clavicle). The top of the humerus is shaped like a ball and fits into a rather flat socket on the scapula (glenoid cavity). A combination of muscles and strong, fibrous tissues that connect muscles to bones (tendons) support your shoulder joint and hold the ball in the socket. Small, fluid-filled sacs (bursae) are located in different areas of the joint. They act as cushions between the bones and the overlying soft tissues and help reduce friction between the gliding tendons and the bone as you move your arm. Your shoulder joint allows a wide range of motion in your arm. This range of motion allows you to do things like scratch your back or throw a ball. However, this range of motion also makes your shoulder more prone to pain from overuse and injury. Causes of shoulder pain can originate from both injury and overuse and usually can be grouped in the following four categories:  Redness, swelling, and pain (inflammation) of the tendon (tendinitis) or the bursae (bursitis).  Instability, such as a dislocation of the joint.  Inflammation of the joint (arthritis).  Broken bone (fracture). HOME CARE INSTRUCTIONS   Apply ice to the sore area.  Put ice in a plastic bag.  Place a towel between your skin and the bag.  Leave the ice on for 15-20 minutes, 03-04 times per day for the first 2 days.  Stop using cold packs if they do not help with the  pain.  If you have a shoulder sling or immobilizer, wear it as long as your caregiver instructs. Only remove it to shower or bathe. Move your arm as little as possible, but keep your hand moving to prevent swelling.  Squeeze a soft ball or foam pad as much as possible to help prevent swelling.  Only take over-the-counter or prescription medicines for pain, discomfort, or fever as directed by your caregiver. SEEK MEDICAL CARE IF:   Your shoulder pain increases, or new pain develops in your arm, hand, or fingers.  Your hand or fingers become cold and numb.  Your pain is not relieved with medicines. SEEK IMMEDIATE MEDICAL CARE IF:   Your arm, hand, or fingers are numb or tingling.  Your arm, hand, or fingers are significantly swollen or turn white or blue. MAKE SURE YOU:   Understand these instructions.  Will watch your condition.  Will get help right away if you are not doing well or get worse. Document Released: 03/13/2005 Document Revised: 02/26/2012 Document Reviewed: 05/18/2011 [facility] Patient Information 2014 [place].  Bicipital Tendonitis Bicipital tendonitis refers to redness, soreness, and swelling (inflammation) or irritation of the bicep tendon. The biceps muscle is located between the elbow and shoulder of the inner arm. The tendon heads, similar to pieces of rope, connect the bicep muscle to the shoulder socket. They are called short head and long head tendons. When tendonitis occurs, the long head tendon is inflamed and swollen, and may be thickened or partially torn.  Bicipital  tendonitis can occur with other problems as well, such as arthritis in the shoulder or acromioclavicular joints, tears in the tendons, or other rotator cuff problems.  CAUSES  Overuse of of the arms for overhead activities is the major cause of tendonitis. Many athletes, such as swimmers, baseball players, and tennis players are prone to bicipital tendonitis. Jobs that require manual labor  or routine chores, especially chores involving overhead activities can result in overuse and tendonitis. SYMPTOMS Symptoms may include:  Pain in and around the front of the shoulder. Pain may be worse with overhead motion.  Pain or aching that radiates down the arm.  Clicking or shifting sensations in the shoulder. DIAGNOSIS Your caregiver may perform the following:  Physical exam and tests of the biceps and shoulder to observe range of motion, strength, and stability.  X-rays or magnetic resonance imaging (MRI) to confirm the diagnosis. In most common cases, these tests are not necessary. Since other problems may exist in the shoulder or rotator cuff, additional tests may be recommended. TREATMENT Treatment may include the following:  Medications  Your caregiver may prescribe over-the-counter pain relievers.  Steroid injections, such as cortisone, may be recommended. These may help to reduce inflammation and pain.  Physical Therapy - Your caregiver may recommend gentle exercises with the arm. These can help restore strength and range of motion. They may be done at home or with a physical therapist's supervision and input.  Surgery - Arthroscopic or open surgery sometimes is necessary. Surgery may include:  Reattachment or repair of the tendon at the shoulder socket.  Removal of the damaged section of the tendon.  Anchoring the tendon to a different area of the shoulder (tenodesis). HOME CARE INSTRUCTIONS   Avoid overhead motion of the affected arm or any other motion that causes pain.  Take medication for pain as directed. Do not take these for more than 3 weeks, unless directed to do so by your caregiver.  Ice the affected area for 20 minutes at a time, 3-4 times per day. Place a towel on the skin over the painful area and the ice or cold pack over the towel. Do not place ice directly on the skin.  Perform gentle exercises at home as directed. These will increase strength  and flexibility. PREVENTION  Modify your activities as much as possible to protect your arm. A physical therapist or sports medicine physician can help you understand options for safe motion.  Avoid repetitive overhead pulling, lifting, reaching, and throwing until your caregiver tells you it is ok to resume these activities. SEEK MEDICAL CARE IF:  Your pain worsens.  You have difficulty moving the affected arm.  You have trouble performing any of the self-care instructions. MAKE SURE YOU:   Understand these instructions.  Will watch your condition.  Will get help right away if you are not doing well or get worse. Document Released: 07/06/2010 Document Revised: 08/26/2011 Document Reviewed: 07/06/2010 [facility] Patient Information 2014 [place], [place].

## 2013-08-21 NOTE — ED Provider Notes (Signed)
CSN: [id number]     Arrival date & time 08/21/13  1059 History   First MD Initiated Contact with Patient 08/21/13 1120     Chief Complaint  Patient presents with  . Shoulder Pain  . Neck Pain     (Consider location/radiation/quality/duration/timing/severity/associated sxs/prior Treatment) HPI Comments: Patient is a 58 year old female past medical history significant for cervical cancer, diverticulitis, anxiety presented to the emergency department for right shoulder pain that radiates into the right arm and into right-sided neck that began 3 days ago. She describes her pain as constant severe throbbing pain. Patient states her pain began after she had some right knee swelling and she was using her right arm were constantly to herself up and brace herself to avoid placing weight on her knee. She denies any falls or injury to the shoulder. Patient states "it feels like I broke something in my arm." She is also concerned with some right hand swelling. Patient states her pain is only with Tylenol and ice. She states moving her arm worsens her pain. She is right-handed. She denies any history of injury to her right shoulder. Patient states she is normally supposed to be on Celebrex for her arthritis but has been off of it d/t intolerance.    Past Medical History  Diagnosis Date  . Cervical cancer     When younger.  . Diverticulitis   . Anxiety    Past Surgical History  Procedure Laterality Date  . Abdominal hysterectomy    . Cervical cancer removal     Family History  Problem Relation Age of Onset  . Cancer Father   . Cancer Other    History  Substance Use Topics  . Smoking status: Current Every Day Smoker -- 0.05 packs/day for 20 years    Types: Cigarettes  . Smokeless tobacco: Never Used  . Alcohol Use: Yes     Comment: occasionally   OB History   Grav Para Term Preterm Abortions TAB SAB Ect Mult Living   3 1 1  2     1      Review of Systems  Constitutional: Negative for  fever and chills.  Respiratory: Negative for shortness of breath.   Cardiovascular: Negative for chest pain.  Musculoskeletal: Positive for arthralgias, joint swelling, myalgias and neck pain. Negative for neck stiffness.  Neurological: Negative for weakness and numbness.  All other systems reviewed and are negative.      Allergies  Review of patient's allergies indicates no known allergies.  Home Medications   Current Outpatient Rx  Name  Route  Sig  Dispense  Refill  . acetaminophen (TYLENOL) 325 MG tablet   Oral   Take 650 mg by mouth every 6 (six) hours as needed for mild pain.         Marland Kitchen aspirin 325 MG tablet   Oral   Take 650 mg by mouth daily.         . Calcium Carb-Cholecalciferol (CALCIUM 600 + D PO)   Oral   Take 1 tablet by mouth daily.         . Glucosamine-Chondroitin (GLUCOSAMINE CHONDR COMPLEX PO)   Oral   Take 1 tablet by mouth daily.         Marland Kitchen LORazepam (ATIVAN) 0.5 MG tablet   Oral   Take 0.5 mg by mouth every 8 (eight) hours.         . Menthol, Topical Analgesic, (ICY HOT PAIN RELIEVING) 2.5 % GEL   Apply externally  Apply 1 application topically every 8 (eight) hours as needed.   57 g   0   . naproxen (NAPROSYN) 500 MG tablet   Oral   Take 1 tablet (500 mg total) by mouth 2 (two) times daily with a meal.   30 tablet   0    BP 115/69  Pulse 88  Temp(Src) 98.9 F (37.2 C) (Oral)  Resp 18  Ht 5\' 7"  (1.702 m)  Wt 135 lb (61.236 kg)  BMI 21.14 kg/m2  SpO2 100% Physical Exam  Constitutional: She is oriented to person, place, and time. She appears well-developed and well-nourished. No distress.  HENT:  Head: Normocephalic and atraumatic.  Right Ear: External ear normal.  Left Ear: External ear normal.  Nose: Nose normal.  Mouth/Throat: Oropharynx is clear and moist.  Eyes: Conjunctivae are normal.  Neck: Normal range of motion. Neck supple.  Cardiovascular: Normal rate, regular rhythm, normal heart sounds and intact distal  pulses.   Cap refill < 3 secs  Pulmonary/Chest: Effort normal and breath sounds normal.  Abdominal: Soft.  Musculoskeletal: She exhibits no edema.       Right shoulder: She exhibits tenderness. She exhibits no swelling, no effusion, no crepitus, no deformity, no laceration, no spasm and normal pulse.       Left shoulder: Normal.       Right elbow: Normal.      Left elbow: Normal.       Right wrist: Normal.       Left wrist: Normal.       Right knee: She exhibits normal range of motion and no swelling. No tenderness found.       Left knee: She exhibits normal range of motion and no swelling. No tenderness found.       Right ankle: She exhibits normal range of motion and no swelling. No tenderness.       Left ankle: She exhibits normal range of motion and no swelling. No tenderness.       Cervical back: Normal.       Thoracic back: Normal.       Lumbar back: Normal.       Right upper arm: She exhibits tenderness. She exhibits no bony tenderness, no swelling, no edema, no deformity and no laceration.       Left upper arm: Normal.       Right forearm: Normal.       Left forearm: Normal.       Right hand: Normal.       Left hand: Normal.  Tenderness in bicepital grove at bicepital head noted. No biceps bulging. No concern for rupture. Patient refuses to move right shoulder d/t pain.   LUE strength 5/5  Neurological: She is alert and oriented to person, place, and time.  Skin: Skin is warm and dry. She is not diaphoretic.  Psychiatric: She has a normal mood and affect.    ED Course  Procedures (including critical care time) Medications  ibuprofen (ADVIL,MOTRIN) tablet 800 mg (800 mg Oral Given 08/21/13 1233)    Labs Review Labs Reviewed - No data to display Imaging Review Dg Shoulder Right  08/21/2013   CLINICAL DATA:  Shoulder pain.  EXAM: RIGHT SHOULDER - 2+ VIEW  COMPARISON:  None.  FINDINGS: Mild degenerative changes in the glenohumeral joint. Subchondral cyst formation in the  glenoid. No acute bony abnormality. Specifically, no fracture, subluxation, or dislocation. Soft tissues are intact.  IMPRESSION: No acute bony abnormality.   Electronically Signed  By: Rolm Baptise M.D.   On: 08/21/2013 13:11     EKG Interpretation None      MDM   Final diagnoses:  Right shoulder pain   Filed Vitals:   08/21/13 1120  BP: 115/69  Pulse: 88  Temp: 98.9 F (37.2 C)  Resp: 18    Afebrile, NAD, non-toxic appearing, AAOx4.  PE shows no instability, tenderness, or deformity of acromioclavicular and sternoclavicular joints, the cervical spine, glenohumeral joint, coracoid process, acromion, or scapula. Tenderness at bicepital grove at biceps head, possible tendonitis. Pain improved with Motrin and Ice. Initially on evaluation patient refusing to use R arm. After Motrin and Ice better able to assess R shoulder. ROM is intact, but painful. RUE strength 4+/5 likely decreased d/t pain. X-ray was obtained d/t patient's non-compliance with examination, no acute abnormality noted. RICE method discussed with patient and advised PCP f/u and  orthopedics f/u if symptoms not improving with conservative measures. Return precautions discussed. Patient is agreeable to plan. Patient is stable at time of discharge    Harlow Mares, PA-C 08/21/13 1527

## 2013-11-01 ENCOUNTER — Emergency Department (HOSPITAL_COMMUNITY)
Admission: EM | Admit: 2013-11-01 | Discharge: 2013-11-01 | Disposition: A | Discharge status: Home / Self Care | Payer: Self-pay | Attending: Emergency Medicine | Admitting: Emergency Medicine

## 2013-11-01 ENCOUNTER — Encounter (HOSPITAL_COMMUNITY): Payer: Self-pay | Admitting: Emergency Medicine

## 2013-11-01 DIAGNOSIS — G5601 Carpal tunnel syndrome, right upper limb: Secondary | ICD-10-CM

## 2013-11-01 DIAGNOSIS — Z791 Long term (current) use of non-steroidal anti-inflammatories (NSAID): Secondary | ICD-10-CM | POA: Insufficient documentation

## 2013-11-01 DIAGNOSIS — Z8719 Personal history of other diseases of the digestive system: Secondary | ICD-10-CM | POA: Insufficient documentation

## 2013-11-01 DIAGNOSIS — Z7982 Long term (current) use of aspirin: Secondary | ICD-10-CM | POA: Insufficient documentation

## 2013-11-01 DIAGNOSIS — F17200 Nicotine dependence, unspecified, uncomplicated: Secondary | ICD-10-CM | POA: Insufficient documentation

## 2013-11-01 DIAGNOSIS — Z79899 Other long term (current) drug therapy: Secondary | ICD-10-CM | POA: Insufficient documentation

## 2013-11-01 DIAGNOSIS — Z8541 Personal history of malignant neoplasm of cervix uteri: Secondary | ICD-10-CM | POA: Insufficient documentation

## 2013-11-01 DIAGNOSIS — F411 Generalized anxiety disorder: Secondary | ICD-10-CM | POA: Insufficient documentation

## 2013-11-01 DIAGNOSIS — G5600 Carpal tunnel syndrome, unspecified upper limb: Secondary | ICD-10-CM | POA: Insufficient documentation

## 2013-11-01 MED ORDER — PREDNISONE 50 MG PO TABS
60.0000 mg | ORAL_TABLET | Freq: Once | ORAL | Status: AC
  Administered 2013-11-01: 60 mg via ORAL
  Filled 2013-11-01 (×2): qty 1

## 2013-11-01 MED ORDER — OXYCODONE-ACETAMINOPHEN 5-325 MG PO TABS: 1.0000 | ORAL_TABLET | ORAL | Status: DC | PRN

## 2013-11-01 MED ORDER — PREDNISONE 20 MG PO TABS: 60.0000 mg | ORAL_TABLET | Freq: Every day | ORAL | Status: DC

## 2013-11-01 NOTE — Discharge Instructions (Signed)
Wear your wrist brace at all times until your hand is feeling better, then wear it while sleeping and when doing activities that stress your wrist. Continue taking your Naproxen twice a day.  Carpal Tunnel Syndrome The carpal tunnel is a narrow area located on the palm side of your wrist. The tunnel is formed by the wrist bones and ligaments. Nerves, blood vessels, and tendons pass through the carpal tunnel. Repeated wrist motion or certain diseases may cause swelling within the tunnel. This swelling pinches the main nerve in the wrist (median nerve) and causes the painful hand and arm condition called carpal tunnel syndrome. CAUSES   Repeated wrist motions.  Wrist injuries.  Certain diseases like arthritis, diabetes, alcoholism, hyperthyroidism, and kidney failure.  Obesity.  Pregnancy. SYMPTOMS   A "pins and needles" feeling in your fingers or hand.  Tingling or numbness in your fingers or hand.  An aching feeling in your entire arm.  Wrist pain that goes up your arm to your shoulder.  Pain that goes down into your palm or fingers.  A weak feeling in your hands. DIAGNOSIS  Your caregiver will take your history and perform a physical exam. An electromyography test may be needed. This test measures electrical signals sent out by the muscles. The electrical signals are usually slowed by carpal tunnel syndrome. You may also need X-rays. TREATMENT  Carpal tunnel syndrome may clear up by itself. Your caregiver may recommend a wrist splint or medicine such as a nonsteroidal anti-inflammatory medicine. Cortisone injections may help. Sometimes, surgery may be needed to free the pinched nerve.  HOME CARE INSTRUCTIONS   Take all medicine as directed by your caregiver. Only take over-the-counter or prescription medicines for pain, discomfort, or fever as directed by your caregiver.  If you were given a splint to keep your wrist from bending, wear it as directed. It is important to wear the  splint at night. Wear the splint for as long as you have pain or numbness in your hand, arm, or wrist. This may take 1 to 2 months.  Rest your wrist from any activity that may be causing your pain. If your symptoms are work-related, you may need to talk to your employer about changing to a job that does not require using your wrist.  Put ice on your wrist after long periods of wrist activity.  Put ice in a plastic bag.  Place a towel between your skin and the bag.  Leave the ice on for 15-20 minutes, 03-04 times a day.  Keep all follow-up visits as directed by your caregiver. This includes any orthopedic referrals, physical therapy, and rehabilitation. Any delay in getting necessary care could result in a delay or failure of your condition to heal. SEEK IMMEDIATE MEDICAL CARE IF:   You have new, unexplained symptoms.  Your symptoms get worse and are not helped or controlled with medicines. MAKE SURE YOU:   Understand these instructions.  Will watch your condition.  Will get help right away if you are not doing well or get worse. Document Released: 05/31/2000 Document Revised: 08/26/2011 Document Reviewed: 04/19/2011 [facility] Patient Information 2014 [place], [place].  Prednisone tablets What is this medicine? PREDNISONE (PRED ni sone) is a corticosteroid. It is commonly used to treat inflammation of the skin, joints, lungs, and other organs. Common conditions treated include asthma, allergies, and arthritis. It is also used for other conditions, such as blood disorders and diseases of the adrenal glands. This medicine may be used for other purposes;  ask your health care provider or pharmacist if you have questions. COMMON BRAND NAME(S): Deltasone, Predone, Sterapred DS, Sterapred What should I tell my health care provider before I take this medicine? They need to know if you have any of these conditions: -Cushing's syndrome -diabetes -glaucoma -heart disease -high blood  pressure -infection (especially a virus infection such as chickenpox, cold sores, or herpes) -kidney disease -liver disease -mental illness -myasthenia gravis -osteoporosis -seizures -stomach or intestine problems -thyroid disease -an unusual or allergic reaction to lactose, prednisone, other medicines, foods, dyes, or preservatives -pregnant or trying to get pregnant -breast-feeding How should I use this medicine? Take this medicine by mouth with a glass of water. Follow the directions on the prescription label. Take this medicine with food. If you are taking this medicine once a day, take it in the morning. Do not take more medicine than you are told to take. Do not suddenly stop taking your medicine because you may develop a severe reaction. Your doctor will tell you how much medicine to take. If your doctor wants you to stop the medicine, the dose may be slowly lowered over time to avoid any side effects. Talk to your pediatrician regarding the use of this medicine in children. Special care may be needed. Overdosage: If you think you have taken too much of this medicine contact a poison control center or emergency room at once. NOTE: This medicine is only for you. Do not share this medicine with others. What if I miss a dose? If you miss a dose, take it as soon as you can. If it is almost time for your next dose, talk to your doctor or health care professional. You may need to miss a dose or take an extra dose. Do not take double or extra doses without advice. What may interact with this medicine? Do not take this medicine with any of the following medications: -metyrapone -mifepristone This medicine may also interact with the following medications: -aminoglutethimide -amphotericin B -aspirin and aspirin-like medicines -barbiturates -certain medicines for diabetes, like glipizide or glyburide -cholestyramine -cholinesterase  inhibitors -cyclosporine -digoxin -diuretics -ephedrine -female hormones, like estrogens and birth control pills -isoniazid -ketoconazole -NSAIDS, medicines for pain and inflammation, like ibuprofen or naproxen -phenytoin -rifampin -toxoids -vaccines -warfarin This list may not describe all possible interactions. Give your health care provider a list of all the medicines, herbs, non-prescription drugs, or dietary supplements you use. Also tell them if you smoke, drink alcohol, or use illegal drugs. Some items may interact with your medicine. What should I watch for while using this medicine? Visit your doctor or health care professional for regular checks on your progress. If you are taking this medicine over a prolonged period, carry an identification card with your name and address, the type and dose of your medicine, and your doctor's name and address. This medicine may increase your risk of getting an infection. Tell your doctor or health care professional if you are around anyone with measles or chickenpox, or if you develop sores or blisters that do not heal properly. If you are going to have surgery, tell your doctor or health care professional that you have taken this medicine within the last twelve months. Ask your doctor or health care professional about your diet. You may need to lower the amount of salt you eat. This medicine may affect blood sugar levels. If you have diabetes, check with your doctor or health care professional before you change your diet or the dose of your  diabetic medicine. What side effects may I notice from receiving this medicine? Side effects that you should report to your doctor or health care professional as soon as possible: -allergic reactions like skin rash, itching or hives, swelling of the face, lips, or tongue -changes in emotions or moods -changes in vision -depressed mood -eye pain -fever or chills, cough, sore throat, pain or difficulty  passing urine -increased thirst -swelling of ankles, feet Side effects that usually do not require medical attention (report to your doctor or health care professional if they continue or are bothersome): -confusion, excitement, restlessness -headache -nausea, vomiting -skin problems, acne, thin and shiny skin -trouble sleeping -weight gain This list may not describe all possible side effects. Call your doctor for medical advice about side effects. You may report side effects to FDA at 1-800-FDA-1088. Where should I keep my medicine? Keep out of the reach of children. Store at room temperature between 15 and 30 degrees C (59 and 86 degrees F). Protect from light. Keep container tightly closed. Throw away any unused medicine after the expiration date. NOTE: This sheet is a summary. It may not cover all possible information. If you have questions about this medicine, talk to your doctor, pharmacist, or health care provider.  2014, [employer] Standard. (2011-01-17 10:57:14)  Acetaminophen; Oxycodone tablets What is this medicine? ACETAMINOPHEN; OXYCODONE (a set a MEE noe fen; ox i KOE done) is a pain reliever. It is used to treat mild to moderate pain. This medicine may be used for other purposes; ask your health care provider or pharmacist if you have questions. COMMON BRAND NAME(S): Endocet, Magnacet, Narvox, Percocet, Perloxx, Primalev, Primlev, Roxicet, Xolox What should I tell my health care provider before I take this medicine? They need to know if you have any of these conditions: -brain tumor -Crohn's disease, inflammatory bowel disease, or ulcerative colitis -drug abuse or addiction -head injury -heart or circulation problems -if you often drink alcohol -kidney disease or problems going to the bathroom -liver disease -lung disease, asthma, or breathing problems -an unusual or allergic reaction to acetaminophen, oxycodone, other opioid analgesics, other medicines, foods,  dyes, or preservatives -pregnant or trying to get pregnant -breast-feeding How should I use this medicine? Take this medicine by mouth with a full glass of water. Follow the directions on the prescription label. Take your medicine at regular intervals. Do not take your medicine more often than directed. Talk to your pediatrician regarding the use of this medicine in children. Special care may be needed. Patients over 21 years old may have a stronger reaction and need a smaller dose. Overdosage: If you think you have taken too much of this medicine contact a poison control center or emergency room at once. NOTE: This medicine is only for you. Do not share this medicine with others. What if I miss a dose? If you miss a dose, take it as soon as you can. If it is almost time for your next dose, take only that dose. Do not take double or extra doses. What may interact with this medicine? -alcohol -antihistamines -barbiturates like amobarbital, butalbital, butabarbital, methohexital, pentobarbital, phenobarbital, thiopental, and secobarbital -benztropine -drugs for bladder problems like solifenacin, trospium, oxybutynin, tolterodine, hyoscyamine, and methscopolamine -drugs for breathing problems like ipratropium and tiotropium -drugs for certain stomach or intestine problems like propantheline, homatropine methylbromide, glycopyrrolate, atropine, belladonna, and dicyclomine -general anesthetics like etomidate, ketamine, nitrous oxide, propofol, desflurane, enflurane, halothane, isoflurane, and sevoflurane -medicines for depression, anxiety, or psychotic disturbances -medicines for sleep -muscle  relaxants -naltrexone -narcotic medicines (opiates) for pain -phenothiazines like perphenazine, thioridazine, chlorpromazine, mesoridazine, fluphenazine, prochlorperazine, promazine, and trifluoperazine -scopolamine -tramadol -trihexyphenidyl This list may not describe all possible interactions. Give  your health care provider a list of all the medicines, herbs, non-prescription drugs, or dietary supplements you use. Also tell them if you smoke, drink alcohol, or use illegal drugs. Some items may interact with your medicine. What should I watch for while using this medicine? Tell your doctor or health care professional if your pain does not go away, if it gets worse, or if you have new or a different type of pain. You may develop tolerance to the medicine. Tolerance means that you will need a higher dose of the medication for pain relief. Tolerance is normal and is expected if you take this medicine for a long time. Do not suddenly stop taking your medicine because you may develop a severe reaction. Your body becomes used to the medicine. This does NOT mean you are addicted. Addiction is a behavior related to getting and using a drug for a non-medical reason. If you have pain, you have a medical reason to take pain medicine. Your doctor will tell you how much medicine to take. If your doctor wants you to stop the medicine, the dose will be slowly lowered over time to avoid any side effects. You may get drowsy or dizzy. Do not drive, use machinery, or do anything that needs mental alertness until you know how this medicine affects you. Do not stand or sit up quickly, especially if you are an older patient. This reduces the risk of dizzy or fainting spells. Alcohol may interfere with the effect of this medicine. Avoid alcoholic drinks. There are different types of narcotic medicines (opiates) for pain. If you take more than one type at the same time, you may have more side effects. Give your health care provider a list of all medicines you use. Your doctor will tell you how much medicine to take. Do not take more medicine than directed. Call emergency for help if you have problems breathing. The medicine will cause constipation. Try to have a bowel movement at least every 2 to 3 days. If you do not have a  bowel movement for 3 days, call your doctor or health care professional. Do not take Tylenol (acetaminophen) or medicines that have acetaminophen with this medicine. Too much acetaminophen can be very dangerous. Many nonprescription medicines contain acetaminophen. Always read the labels carefully to avoid taking more acetaminophen. What side effects may I notice from receiving this medicine? Side effects that you should report to your doctor or health care professional as soon as possible: -allergic reactions like skin rash, itching or hives, swelling of the face, lips, or tongue -breathing difficulties, wheezing -confusion -light headedness or fainting spells -severe stomach pain -unusually weak or tired -yellowing of the skin or the whites of the eyes  Side effects that usually do not require medical attention (report to your doctor or health care professional if they continue or are bothersome): -dizziness -drowsiness -nausea -vomiting This list may not describe all possible side effects. Call your doctor for medical advice about side effects. You may report side effects to FDA at 1-800-FDA-1088. Where should I keep my medicine? Keep out of the reach of children. This medicine can be abused. Keep your medicine in a safe place to protect it from theft. Do not share this medicine with anyone. Selling or giving away this medicine is dangerous and against the  law. Store at room temperature between 20 and 25 degrees C (68 and 77 degrees F). Keep container tightly closed. Protect from light. This medicine may cause accidental overdose and death if it is taken by other adults, children, or pets. Flush any unused medicine down the toilet to reduce the chance of harm. Do not use the medicine after the expiration date. NOTE: This sheet is a summary. It may not cover all possible information. If you have questions about this medicine, talk to your doctor, pharmacist, or health care provider.  2014,  [employer] Standard. (2013-01-25 13:17:35)

## 2013-11-01 NOTE — ED Provider Notes (Signed)
CSN: [id number]     Arrival date & time 11/01/13  0029 History   First MD Initiated Contact with Patient 11/01/13 [phone]     Chief complaint: Right hand pain  (Consider location/radiation/quality/duration/timing/severity/associated sxs/prior Treatment) The history is provided by the patient.   58 year old female comes in because of pain and swelling in right hand for the last 2 days. She denies any unusual trauma or overuse. Pain is moderate to severe and she rates it an 8/10. It is worse with movement of the hand and better if she holds still. She has been taking naproxen which has not been giving her any relief. She has also noticed numbness in the hand. She has noted there are times when she wakes up at night and her arm feels like it is going to sleep. She had a similar episode in the past which responded well to prednisone and she is requesting a prescription for prednisone.  Past Medical History  Diagnosis Date  . Cervical cancer     When younger.  . Diverticulitis   . Anxiety    Past Surgical History  Procedure Laterality Date  . Abdominal hysterectomy    . Cervical cancer removal     Family History  Problem Relation Age of Onset  . Cancer Father   . Cancer Other    History  Substance Use Topics  . Smoking status: Current Every Day Smoker -- 0.05 packs/day for 20 years    Types: Cigarettes  . Smokeless tobacco: Never Used  . Alcohol Use: Yes     Comment: occasionally   OB History   Grav Para Term Preterm Abortions TAB SAB Ect Mult Living   3 1 1  2     1      Review of Systems  All other systems reviewed and are negative.     Allergies  Review of patient's allergies indicates no known allergies.  Home Medications   Prior to Admission medications   Medication Sig Start Date End Date Taking? Authorizing Provider  Calcium Carb-Cholecalciferol (CALCIUM 600 + D PO) Take 1 tablet by mouth daily.   Yes Historical Provider, MD  Glucosamine-Chondroitin (GLUCOSAMINE  CHONDR COMPLEX PO) Take 1 tablet by mouth daily.   Yes Historical Provider, MD  LORazepam (ATIVAN) 0.5 MG tablet Take 0.5 mg by mouth every 8 (eight) hours.   Yes Historical Provider, MD  naproxen (NAPROSYN) 500 MG tablet Take 1 tablet (500 mg total) by mouth 2 (two) times daily with a meal. 08/21/13  Yes Jennifer L Piepenbrink, PA-C  acetaminophen (TYLENOL) 325 MG tablet Take 650 mg by mouth every 6 (six) hours as needed for mild pain.    Historical Provider, MD  aspirin 325 MG tablet Take 650 mg by mouth daily.    Historical Provider, MD  Menthol, Topical Analgesic, (ICY HOT PAIN RELIEVING) 2.5 % GEL Apply 1 application topically every 8 (eight) hours as needed. 08/21/13   Jennifer L Piepenbrink, PA-C   BP 97/50  Pulse 69  Temp(Src) 98.4 F (36.9 C) (Oral)  Resp 16  Ht 5\' 7"  (1.702 m)  Wt 135 lb (61.236 kg)  BMI 21.14 kg/m2  SpO2 100% Physical Exam  Nursing note and vitals reviewed.  58 year old female, resting comfortably and in no acute distress. Vital signs are normal. Oxygen saturation is 100%, which is normal. Head is normocephalic and atraumatic. PERRLA, EOMI. Oropharynx is clear. Neck is nontender and supple without adenopathy or JVD. Back is nontender and there is no CVA  tenderness. Lungs are clear without rales, wheezes, or rhonchi. Chest is nontender. Heart has regular rate and rhythm without murmur. Abdomen is soft, flat, nontender without masses or hepatosplenomegaly and peristalsis is normoactive. Extremities: Heberden's and Bouchard's nodes are present. There is mild swelling of the fingers of the right hand. There is no swelling of the Palm R. surface of the hand or dorsal surface of the hand and no wrist swelling. There is decreased pincer grasp on the right and there is decreased sensation on the palmar surface of the thumb through the fourth finger consistent with median nerve lesion. Skin is warm and dry without rash. Neurologic: Mental status is normal, cranial nerves  are intact, there are no motor or sensory deficits.  ED Course  Procedures (including critical care time)   MDM   Final diagnoses:  Carpal tunnel syndrome, right    Carpal tunnel syndrome on the right. After further questioning, patient states that she has been doing significant amount of yard work. Review of old records shows that she was seen about one year ago for tendinitis in the hand which was felt to be from using a vacuum cleaner. Patient is advised of diagnosis and she is discharged with prescription for prednisone. She has a wrist brace at home from her prior visit and she is advised to use it at all times until her hand has improved and then consider he using it at bedtime and also when she is doing activities which would stress her wrists. She is also given a prescription for oxycodone and acetaminophen to take for pain. She is to followup with her PCP and is also referred to orthopedics.    Delora Fuel, MD 75/10/25 [id number]

## 2013-11-01 NOTE — ED Notes (Signed)
Pt c/o pain, swelling, and numbness to right hand for several days.  Pt states she has had this happen before and cannot remember how she was treated for it.  Pt denies injury or trauma

## 2013-11-03 MED FILL — Oxycodone w/ Acetaminophen Tab 5-325 MG: ORAL | Qty: 6 | Status: AC

## 2014-04-18 ENCOUNTER — Encounter (HOSPITAL_COMMUNITY): Payer: Self-pay | Admitting: Emergency Medicine

## 2014-12-21 ENCOUNTER — Emergency Department (HOSPITAL_COMMUNITY)
Admission: EM | Admit: 2014-12-21 | Discharge: 2014-12-21 | Disposition: A | Discharge status: Home / Self Care | Payer: Self-pay | Attending: Emergency Medicine | Admitting: Emergency Medicine

## 2014-12-21 ENCOUNTER — Encounter (HOSPITAL_COMMUNITY): Payer: Self-pay | Admitting: Emergency Medicine

## 2014-12-21 DIAGNOSIS — Z7982 Long term (current) use of aspirin: Secondary | ICD-10-CM | POA: Insufficient documentation

## 2014-12-21 DIAGNOSIS — Z79899 Other long term (current) drug therapy: Secondary | ICD-10-CM | POA: Insufficient documentation

## 2014-12-21 DIAGNOSIS — F419 Anxiety disorder, unspecified: Secondary | ICD-10-CM | POA: Insufficient documentation

## 2014-12-21 DIAGNOSIS — Z720 Tobacco use: Secondary | ICD-10-CM | POA: Insufficient documentation

## 2014-12-21 DIAGNOSIS — Z8719 Personal history of other diseases of the digestive system: Secondary | ICD-10-CM | POA: Insufficient documentation

## 2014-12-21 DIAGNOSIS — M5412 Radiculopathy, cervical region: Secondary | ICD-10-CM | POA: Insufficient documentation

## 2014-12-21 DIAGNOSIS — Z8541 Personal history of malignant neoplasm of cervix uteri: Secondary | ICD-10-CM | POA: Insufficient documentation

## 2014-12-21 MED ORDER — METHYLPREDNISOLONE ACETATE 80 MG/ML IJ SUSP
80.0000 mg | Freq: Once | INTRAMUSCULAR | Status: AC
  Administered 2014-12-21: 80 mg via INTRAMUSCULAR
  Filled 2014-12-21: qty 1

## 2014-12-21 MED ORDER — OXYCODONE-ACETAMINOPHEN 5-325 MG PO TABS: 1.0000 | ORAL_TABLET | ORAL | Status: DC | PRN

## 2014-12-21 NOTE — Discharge Instructions (Signed)

## 2014-12-21 NOTE — ED Provider Notes (Signed)
CSN: [id number]     Arrival date & time 12/21/14  1053 History  This chart was scribed for Allison Greek, MD by Starleen Arms, ED Scribe. This patient was seen in room [facility]12/[facility]12 and the patient's care was started at 11:29 AM.     Chief Complaint  Patient presents with  . Shoulder Pain   The history is provided by the patient. No language interpreter was used.   HPI Comments: Allison Dickerson is a 59 y.o. female who presents to the Emergency Department complaining of right shoulder pain onset 2 days ago with worsening today.  She reports radiation down into the right hand and slight radiation into the neck.  She also reports numbness in several fingers of her right hand.  She has a prior episode of similar quality, however, it only manifested in the hand.  She denies injury.     Past Medical History  Diagnosis Date  . Cervical cancer     When younger.  . Diverticulitis   . Anxiety    Past Surgical History  Procedure Laterality Date  . Abdominal hysterectomy    . Cervical cancer removal     Family History  Problem Relation Age of Onset  . Cancer Father   . Cancer Other    History  Substance Use Topics  . Smoking status: Current Every Day Smoker -- 0.05 packs/day for 20 years    Types: Cigarettes  . Smokeless tobacco: Never Used  . Alcohol Use: Yes     Comment: occasionally   OB History    Gravida Para Term Preterm AB TAB SAB Ectopic Multiple Living   3 1 1  2     1      Review of Systems  Musculoskeletal: Positive for arthralgias.  All other systems reviewed and are negative.     Allergies  Review of patient's allergies indicates no known allergies.  Home Medications   Prior to Admission medications   Medication Sig Start Date End Date Taking? Authorizing Provider  acetaminophen (TYLENOL) 325 MG tablet Take 650 mg by mouth every 6 (six) hours as needed for mild pain.    Historical Provider, MD  aspirin 325 MG tablet Take 650 mg by mouth daily.     Historical Provider, MD  Calcium Carb-Cholecalciferol (CALCIUM 600 + D PO) Take 1 tablet by mouth daily.    Historical Provider, MD  Glucosamine-Chondroitin (GLUCOSAMINE CHONDR COMPLEX PO) Take 1 tablet by mouth daily.    Historical Provider, MD  LORazepam (ATIVAN) 0.5 MG tablet Take 0.5 mg by mouth every 8 (eight) hours.    Historical Provider, MD  oxyCODONE-acetaminophen (PERCOCET) 5-325 MG per tablet Take 1 tablet by mouth every 4 (four) hours as needed. 12/21/14   Allison Greek, MD   BP 122/80 mmHg  Pulse 73  Temp(Src) 98.6 F (37 C) (Oral)  Resp 18  Ht 5\' 7"  (1.702 m)  Wt 130 lb (58.968 kg)  BMI 20.36 kg/m2  SpO2 99% Physical Exam  Constitutional: She is oriented to person, place, and time. She appears well-developed and well-nourished. No distress.  HENT:  Head: Normocephalic and atraumatic.  Right Ear: Hearing normal.  Left Ear: Hearing normal.  Nose: Nose normal.  Mouth/Throat: Oropharynx is clear and moist and mucous membranes are normal.  Eyes: Conjunctivae and EOM are normal. Pupils are equal, round, and reactive to light.  Neck: Normal range of motion. Neck supple.  Cardiovascular: Regular rhythm, S1 normal and S2 normal.  Exam reveals no gallop  and no friction rub.   No murmur heard. Pulmonary/Chest: Effort normal and breath sounds normal. No respiratory distress. She exhibits no tenderness.  Abdominal: Soft. Normal appearance and bowel sounds are normal. There is no hepatosplenomegaly. There is no tenderness. There is no rebound, no guarding, no tenderness at McBurney's point and negative Murphy's sign. No hernia.  Musculoskeletal: Normal range of motion.  Right shoulder tenderness.  Decreased ROM due to pain.  Normal strength and sensation.    Neurological: She is alert and oriented to person, place, and time. She has normal strength. No cranial nerve deficit or sensory deficit. Coordination normal. GCS eye subscore is 4. GCS verbal subscore is 5. GCS motor  subscore is 6.  Skin: Skin is warm, dry and intact. No rash noted. No cyanosis.  Psychiatric: She has a normal mood and affect. Her speech is normal and behavior is normal. Thought content normal.  Nursing note and vitals reviewed.   ED Course  Procedures (including critical care time)  DIAGNOSTIC STUDIES: Oxygen Saturation is 98% on RA, normal by my interpretation.    COORDINATION OF CARE:  11:36 AM  Discussed treatment plan with patient at bedside.  Patient acknowledges and agrees with plan.    Labs Review Labs Reviewed - No data to display  Imaging Review No results found.   EKG Interpretation None      MDM   Final diagnoses:  Cervical radiculopathy   Patient presents to the ER for evaluation of pain in the right shoulder. Patient reports that pain is primarily in the shoulder radiates down the arm. She is experiencing some numbness and tingling in her small fingers of the hand. Patient reports worsening symptoms with movement of the arm. She does have some neck pain, but it is mild paraspinal. Symptoms consistent with cervical radiculopathy or possibly primary shoulder problem such as bursitis.   I personally performed the services described in this documentation, which was scribed in my presence. The recorded information has been reviewed and is accurate.     Allison Greek, MD 12/23/14 1539

## 2014-12-21 NOTE — ED Notes (Signed)
Right shoulder pain radiating into back with three fingers numb

## 2017-02-18 ENCOUNTER — Encounter (HOSPITAL_COMMUNITY): Payer: Self-pay

## 2017-02-18 ENCOUNTER — Emergency Department (HOSPITAL_COMMUNITY): Payer: Self-pay

## 2017-02-18 ENCOUNTER — Observation Stay (HOSPITAL_COMMUNITY)
Admission: EM | Admit: 2017-02-18 | Discharge: 2017-02-19 | Disposition: A | Discharge status: Home / Self Care | Payer: Self-pay | Attending: Family Medicine | Admitting: Family Medicine

## 2017-02-18 DIAGNOSIS — R0602 Shortness of breath: Secondary | ICD-10-CM | POA: Insufficient documentation

## 2017-02-18 DIAGNOSIS — F17210 Nicotine dependence, cigarettes, uncomplicated: Secondary | ICD-10-CM | POA: Insufficient documentation

## 2017-02-18 DIAGNOSIS — R079 Chest pain, unspecified: Secondary | ICD-10-CM

## 2017-02-18 DIAGNOSIS — Z8541 Personal history of malignant neoplasm of cervix uteri: Secondary | ICD-10-CM | POA: Insufficient documentation

## 2017-02-18 DIAGNOSIS — R5383 Other fatigue: Secondary | ICD-10-CM | POA: Insufficient documentation

## 2017-02-18 DIAGNOSIS — F411 Generalized anxiety disorder: Secondary | ICD-10-CM | POA: Diagnosis present

## 2017-02-18 DIAGNOSIS — Z79899 Other long term (current) drug therapy: Secondary | ICD-10-CM | POA: Insufficient documentation

## 2017-02-18 DIAGNOSIS — R0789 Other chest pain: Principal | ICD-10-CM | POA: Insufficient documentation

## 2017-02-18 DIAGNOSIS — F419 Anxiety disorder, unspecified: Secondary | ICD-10-CM | POA: Insufficient documentation

## 2017-02-18 DIAGNOSIS — Z23 Encounter for immunization: Secondary | ICD-10-CM | POA: Insufficient documentation

## 2017-02-18 DIAGNOSIS — R42 Dizziness and giddiness: Secondary | ICD-10-CM | POA: Insufficient documentation

## 2017-02-18 HISTORY — DX: Mononeuropathy, unspecified: G58.9

## 2017-02-18 LAB — TROPONIN I
Troponin I: <0.03 ng/mL (ref <0.03)
Troponin I: <0.03 ng/mL (ref <0.03)
Troponin I: <0.03 ng/mL (ref <0.03)

## 2017-02-18 LAB — BASIC METABOLIC PANEL
ANION GAP: 9 (ref 5–15)
BUN: 19 mg/dL (ref 6–20)
CO2: 24 mmol/L (ref 22–32)
Calcium: 9.9 mg/dL (ref 8.9–10.3)
Chloride: 107 mmol/L (ref 101–111)
Creatinine, Ser: 0.89 mg/dL (ref 0.44–1.00)
GFR calc Af Amer: >60 mL/min (ref >60)
GFR calc non Af Amer: >60 mL/min (ref >60)
GLUCOSE: 103 mg/dL — AB (ref 65–99)
POTASSIUM: 3.9 mmol/L (ref 3.5–5.1)
Sodium: 140 mmol/L (ref 135–145)

## 2017-02-18 LAB — CBC
HEMATOCRIT: 38.6 % (ref 36.0–46.0)
HEMOGLOBIN: 13.3 g/dL (ref 12.0–15.0)
MCH: 31.4 pg (ref 26.0–34.0)
MCHC: 34.5 g/dL (ref 30.0–36.0)
MCV: 91.3 fL (ref 78.0–100.0)
Platelets: 233 10*3/uL (ref 150–400)
RBC: 4.23 MIL/uL (ref 3.87–5.11)
RDW: 12.7 % (ref 11.5–15.5)
WBC: 5.5 10*3/uL (ref 4.0–10.5)

## 2017-02-18 LAB — TSH: TSH: 1.334 u[IU]/mL (ref 0.350–4.500)

## 2017-02-18 MED ORDER — PNEUMOCOCCAL VAC POLYVALENT 25 MCG/0.5ML IJ INJ
0.5000 mL | INJECTION | INTRAMUSCULAR | Status: AC
Start: 2017-02-19 — End: 2017-02-19
  Administered 2017-02-19: 0.5 mL via INTRAMUSCULAR
  Filled 2017-02-18: qty 0.5

## 2017-02-18 MED ORDER — ASPIRIN EC 81 MG PO TBEC
81.0000 mg | DELAYED_RELEASE_TABLET | Freq: Every day | ORAL | Status: DC
  Administered 2017-02-18 – 2017-02-19 (×2): 81 mg via ORAL
  Filled 2017-02-18 (×2): qty 1

## 2017-02-18 MED ORDER — ASPIRIN 81 MG PO CHEW
324.0000 mg | CHEWABLE_TABLET | Freq: Once | ORAL | Status: AC
  Administered 2017-02-18: 324 mg via ORAL
  Filled 2017-02-18: qty 4

## 2017-02-18 MED ORDER — SODIUM CHLORIDE 0.9% FLUSH
3.0000 mL | Freq: Two times a day (BID) | INTRAVENOUS | Status: DC
  Administered 2017-02-18: 3 mL via INTRAVENOUS

## 2017-02-18 MED ORDER — NITROGLYCERIN 0.4 MG SL SUBL
0.4000 mg | SUBLINGUAL_TABLET | Freq: Once | SUBLINGUAL | Status: AC
  Administered 2017-02-18: 0.4 mg via SUBLINGUAL

## 2017-02-18 MED ORDER — SODIUM CHLORIDE 0.9 % IV BOLUS (SEPSIS)
1000.0000 mL | Freq: Once | INTRAVENOUS | Status: AC
  Administered 2017-02-18: 1000 mL via INTRAVENOUS

## 2017-02-18 MED ORDER — HEPARIN SODIUM (PORCINE) 5000 UNIT/ML IJ SOLN
5000.0000 [IU] | Freq: Three times a day (TID) | INTRAMUSCULAR | Status: DC
  Administered 2017-02-18 – 2017-02-19 (×2): 5000 [IU] via SUBCUTANEOUS
  Filled 2017-02-18 (×2): qty 1

## 2017-02-18 MED ORDER — LORAZEPAM 1 MG PO TABS: 1.0000 mg | ORAL_TABLET | Freq: Three times a day (TID) | ORAL | Status: DC | PRN

## 2017-02-18 MED ORDER — OXYCODONE-ACETAMINOPHEN 5-325 MG PO TABS: 1.0000 | ORAL_TABLET | ORAL | Status: DC | PRN

## 2017-02-18 MED ORDER — SODIUM CHLORIDE 0.9 % IV SOLN
Freq: Once | INTRAVENOUS | Status: AC
  Administered 2017-02-18: 16:00:00 via INTRAVENOUS

## 2017-02-18 MED ORDER — NITROGLYCERIN 0.4 MG SL SUBL
SUBLINGUAL_TABLET | SUBLINGUAL | Status: AC
  Administered 2017-02-18: 0.4 mg via SUBLINGUAL
  Filled 2017-02-18: qty 1

## 2017-02-18 NOTE — ED Triage Notes (Signed)
Pt reports that she has been nauseated for several days. Pt noted that after she mopped last night the center of her chest started hurting and had difficulty catching breath. Reports right arm felt like it was cramping and continues to hurt. Also reports frequent uriantion

## 2017-02-18 NOTE — ED Notes (Signed)
Pt reports relief of chest pressure after taking nitroglycerin 0.4 mg tab. Pt also reports feeling better "all over".

## 2017-02-18 NOTE — ED Provider Notes (Signed)
[facility] DEPT Provider Note   CSN: [id number] Arrival date & time: 02/18/17  1011     History   Chief Complaint Chief Complaint  Patient presents with  . Shortness of Breath  . Chest Pain    HPI Allison Dickerson is a 61 y.o. female. hief complaint is fatigue, shortness of breath, and chest heaviness  HPI:  27-year-old female. Describes fatigue for the last month. Also states that she has some chronic discomfort in her right arm that she is told is from a pinched nerve from her primary care, Dr. Cindie Laroche.    She states that she has also had some pressure in her chest and worsening fatigue for the last 24 hours. Cannot specify if she has exertional symptoms. No history of retention, high cholesterol, or diabetes. Does see her primary care "relief for her medications. However she does not recall the last time she has had blood testing. She does smoke, but only a cigarette or 2 per day per her report and has never been a heavier smoker.  Hives weakness and lightheadedness. Pressure here today is only 671 systolic. She does not know what her normal blood pressure runs.  Patient also states that she has been awakening to urinate for the last 2 nights which is unusual for her.  Patient further expresses that she had a "target lesion" on her left wrist presents at her small dot with a surrounding red circle. States this is gone and now it is simply just a red dot on her left wrist. She does anxiously expressed concern about possible Lyme's disease. States that "we get ticks on [place] all the time".  Past Medical History:  Diagnosis Date  . Anxiety   . Cervical cancer ([place])    When younger.  . Diverticulitis   . Pinched nerve    right hand    There are no active problems to display for this patient.   Past Surgical History:  Procedure Laterality Date  . ABDOMINAL HYSTERECTOMY    . cervical cancer removal      OB History    Gravida Para Term Preterm AB Living   3 1 1   2 1    SAB TAB Ectopic Multiple Live Births                   Home Medications    Prior to Admission medications   Medication Sig Start Date End Date Taking? Authorizing Provider  Calcium Carb-Cholecalciferol (CALCIUM 600 + D PO) Take 1 tablet by mouth daily.   Yes [provider]  Glucosamine-Chondroitin (GLUCOSAMINE CHONDR COMPLEX PO) Take 1 tablet by mouth daily.   Yes [provider]  LORazepam (ATIVAN) 0.5 MG tablet Take 0.5 mg by mouth every 8 (eight) hours.   Yes [provider]  naproxen sodium (ANAPROX) 220 MG tablet Take 220 mg by mouth 2 (two) times daily.   Yes [provider]  oxyCODONE-acetaminophen (PERCOCET) 5-325 MG per tablet Take 1 tablet by mouth every 4 (four) hours as needed. 12/21/14  Yes Pollina, Gwenyth Allegra, MD    Family History Family History  Problem Relation Age of Onset  . Cancer Father   . Cancer Other     Social History Social History  Substance Use Topics  . Smoking status: Current Every Day Smoker    Packs/day: 0.50    Years: 20.00    Types: Cigarettes  . Smokeless tobacco: Never Used  . Alcohol use No     Allergies  Patient has no known allergies.   Review of Systems Review of Systems  Constitutional: Positive for appetite change and fatigue. Negative for chills, diaphoresis and fever.  HENT: Negative for mouth sores, sore throat and trouble swallowing.   Eyes: Negative for visual disturbance.  Respiratory: Negative for cough, chest tightness, shortness of breath and wheezing.   Cardiovascular: Positive for chest pain.       Chest "heaviness".  Gastrointestinal: Negative for abdominal distention, abdominal pain, diarrhea, nausea and vomiting.  Endocrine: Negative for polydipsia, polyphagia and polyuria.  Genitourinary: Negative for dysuria, frequency and hematuria.  Musculoskeletal: Negative for gait problem.  Skin: Negative for color change, pallor and rash.  Neurological: Positive for  light-headedness. Negative for dizziness, syncope and headaches.  Hematological: Does not bruise/bleed easily.  Psychiatric/Behavioral: Negative for behavioral problems and confusion.     Physical Exam Updated Vital Signs BP 101/70   Pulse 64   Temp 98.2 F (36.8 C) (Oral)   Resp 15   Ht 5\' 7"  (1.702 m)   Wt 63.5 kg (140 lb)   SpO2 98%   BMI 21.93 kg/m   Physical Exam  Constitutional: She is oriented to person, place, and time. She appears well-developed and well-nourished. No distress.  HENT:  Head: Normocephalic.  Eyes: Pupils are equal, round, and reactive to light. Conjunctivae are normal. No scleral icterus.  Neck: Normal range of motion. Neck supple. No thyromegaly present.  No thyromegaly  Cardiovascular: Normal rate and regular rhythm.  Exam reveals no gallop and no friction rub.   No murmur heard. No murmurs. No gallop. No crackles or rales. No dependent edema.  Pulmonary/Chest: Effort normal and breath sounds normal. No respiratory distress. She has no wheezes. She has no rales.  Dyspnea with conversation. Clear lungs. Well oxygenated at 98% sat.  Abdominal: Soft. Bowel sounds are normal. She exhibits no distension. There is no tenderness. There is no rebound.  Musculoskeletal: Normal range of motion.  Neurological: She is alert and oriented to person, place, and time.  Skin: Skin is warm and dry. No rash noted.  Psychiatric: She has a normal mood and affect. Her behavior is normal.     ED Treatments / Results  Labs (all labs ordered are listed, but only abnormal results are displayed) Labs Reviewed  BASIC METABOLIC PANEL - Abnormal; Notable for the following:       Result Value   Glucose, Bld 103 (*)    All other components within normal limits  CBC  TROPONIN I  TSH  B. BURGDORFI ANTIBODIES    EKG  EKG Interpretation  Date/Time:  Tuesday February 18 2017 10:18:46 EDT Ventricular Rate:  80 PR Interval:    QRS Duration: 85 QT Interval:  369 QTC  Calculation: 426 R Axis:   78 Text Interpretation:  Sinus rhythm No injury, or ectopy. No significant change since last tracing Confirmed by Tanna Furry [phone]) on 02/18/2017 10:21:28 AM       Radiology Dg Chest 2 View  Result Date: 02/18/2017 CLINICAL DATA:  Chest pressure, bilateral arm pain, chest pain EXAM: CHEST  2 VIEW COMPARISON:  05/27/2013 FINDINGS: The heart size and mediastinal contours are within normal limits. Both lungs are clear. The visualized skeletal structures are unremarkable. IMPRESSION: No active cardiopulmonary disease. Electronically Signed   By: Rolm Baptise M.D.   On: 02/18/2017 11:33    Procedures Procedures (including critical care time)  Medications Ordered in ED Medications  0.9 %  sodium chloride infusion (not administered)  aspirin  chewable tablet 324 mg (324 mg Oral Given 02/18/17 1055)  sodium chloride 0.9 % bolus 1,000 mL (0 mLs Intravenous Stopped 02/18/17 1235)  nitroGLYCERIN (NITROSTAT) SL tablet 0.4 mg (0.4 mg Sublingual Given 02/18/17 1158)     Initial Impression / Assessment and Plan / ED Course  I have reviewed the triage vital signs and the nursing notes.  Pertinent labs & imaging results that were available during my care of the patient were reviewed by me and considered in my medical decision making (see chart for details).    Blood pressure only 425 systolic. We'll plan IV fluids. We'll hold on nitroglycerin until pressures improved. Aspirin. Await enzymes. EKG shows no acute or ischemic changes. No injury or ectopy.  And reevaluation after the above studies. Will add on Lymes antibodies.  13:08:  Patient's blood pressure improved. Given seen on nitroglycerin. She states that they "everything feel better". The discomfort in her arms or shoulders is better. She still has the pain in her right hand small finger which seems radicular. Perkins are normal. Sensation is someone standing on her chest has resolved.  Score of 4. Suspicious story, normal  EKG, age greater than 61, 1-2 risk factors. Pain is resolved with nitroglycerin. All discussed with hospitalist regarding admission for rule out and consideration for further testing  Final Clinical Impressions(s) / ED Diagnoses   Final diagnoses:  Chest pain, unspecified type    New Prescriptions New Prescriptions   No medications on file     Tanna Furry, MD 02/18/17 1309

## 2017-02-18 NOTE — H&P (Signed)
History and Physical    Allison Dickerson WNU:[id number] DOB: Feb 21, 1956 DOA: 02/18/2017  PCP: Lucia Gaskins, MD  Patient coming from: Home.    Chief Complaint: Retrosternal chest pressure.   HPI: Allison Dickerson is an 61 y.o. 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  She has no pleuritic chest pain.  In the ER, evaluation included a normal EKG, negative troponin, and clear chest xray.  She was given NTG, which improved her symptoms (she said maybe a little). Hospitalist was asked to admit her for cardiac r/out.  Her chest pressure was 5/10 at home, and minimal to none now.   ED Course:  See above.  Rewiew of Systems:  Constitutional: Negative for malaise, fever and chills. No significant weight loss or weight gain Eyes: Negative for eye pain, redness and discharge, diplopia, visual changes, or flashes of light. ENMT: Negative for ear pain, hoarseness, nasal congestion, sinus pressure and sore throat. No headaches; tinnitus, drooling, or problem swallowing. Cardiovascular: Negative for palpitations, diaphoresis, dyspnea and peripheral edema. ; No orthopnea, PND Respiratory: Negative for cough, hemoptysis, wheezing and stridor. No pleuritic chestpain. Gastrointestinal: Negative for diarrhea, constipation,  melena, blood in stool, hematemesis, jaundice and rectal bleeding.    Genitourinary: Negative for frequency, dysuria, incontinence,flank pain and hematuria; Musculoskeletal: Negative for back pain and neck pain. Negative for swelling and trauma.;  Skin: . Negative for pruritus, rash, abrasions, bruising and skin lesion.; ulcerations Neuro: Negative for headache, lightheadedness and neck stiffness. Negative for weakness, altered level of consciousness , altered mental status, extremity  weakness, burning feet, involuntary movement, seizure and syncope.  Psych: negative for anxiety, depression, insomnia, tearfulness, panic attacks, hallucinations, paranoia, suicidal or homicidal ideation    Past Medical History:  Diagnosis Date  . Anxiety   . Cervical cancer ([place])    When younger.  . Diverticulitis   . Pinched nerve    right hand    Past Surgical History:  Procedure Laterality Date  . ABDOMINAL HYSTERECTOMY    . cervical cancer removal       reports that she has been smoking Cigarettes.  She has a 10.00 pack-year smoking history. She has never used smokeless tobacco. She reports that she uses drugs, including Marijuana. She reports that she does not drink alcohol.  No Known Allergies  Family History  Problem Relation Age of Onset  . Cancer Father   . Cancer Other      Prior to Admission medications   Medication Sig Start Date End Date Taking? Authorizing Provider  Calcium Carb-Cholecalciferol (CALCIUM 600 + D PO) Take 1 tablet by mouth daily.   Yes [provider]  Glucosamine-Chondroitin (GLUCOSAMINE CHONDR COMPLEX PO) Take 1 tablet by mouth daily.   Yes [provider]  LORazepam (ATIVAN) 0.5 MG tablet Take 0.5 mg by mouth every 8 (eight) hours.   Yes [provider]  naproxen sodium (ANAPROX) 220 MG tablet Take 220 mg by mouth 2 (two) times daily.   Yes [provider]  oxyCODONE-acetaminophen (PERCOCET) 5-325 MG per tablet Take 1 tablet by mouth every 4 (four) hours as needed. 12/21/14  Yes [name], MD    Physical Exam: Vitals:   02/18/17 1215 02/18/17 1230 02/18/17 1300 02/18/17 1315  BP:  101/70    Pulse: 62 64 64 (!)  57  Resp: 18 15    Temp:      TempSrc:      SpO2: 97% 98% 98% 99%  Weight:      Height:          Constitutional: NAD, calm, comfortable Vitals:   02/18/17 1215 02/18/17 1230 02/18/17 1300 02/18/17 1315  BP:  101/70    Pulse: 62 64 64 (!) 57  Resp: 18 15    Temp:        TempSrc:      SpO2: 97% 98% 98% 99%  Weight:      Height:       Eyes: PERRL, lids and conjunctivae normal ENMT: Mucous membranes are moist. Posterior pharynx clear of any exudate or lesions.Normal dentition.  Neck: normal, supple, no masses, no thyromegaly Respiratory: clear to auscultation bilaterally, no wheezing, no crackles. Normal respiratory effort. No accessory muscle use.  Cardiovascular: Regular rate and rhythm, no murmurs / rubs / gallops. No extremity edema. 2+ pedal pulses. No carotid bruits.  Abdomen: no tenderness, no masses palpated. No hepatosplenomegaly. Bowel sounds positive.  Musculoskeletal: no clubbing / cyanosis. No joint deformity upper and lower extremities. Good ROM, no contractures. Normal muscle tone.  Skin: no rashes, lesions, ulcers. No induration Neurologic: CN 2-12 grossly intact. Sensation intact, DTR normal. Strength 5/5 in all 4.  Psychiatric: Normal judgment and insight. Alert and oriented x 3. Normal mood.   Labs on Admission: I have personally reviewed following labs and imaging studies  CBC:  Recent Labs Lab 02/18/17 1022  WBC 5.5  HGB 13.3  HCT 38.6  MCV 91.3  PLT 101   Basic Metabolic Panel:  Recent Labs Lab 02/18/17 1022  NA 140  K 3.9  CL 107  CO2 24  GLUCOSE 103*  BUN 19  CREATININE 0.89  CALCIUM 9.9   Cardiac Enzymes:  Recent Labs Lab 02/18/17 1022  TROPONINI <0.03   Thyroid Function Tests:  Recent Labs  02/18/17 1036  TSH 1.334   Urine analysis:    Component Value Date/Time   COLORURINE YELLOW 05/27/2013 1024   APPEARANCEUR CLEAR 05/27/2013 1024   LABSPEC 1.025 05/27/2013 1024   PHURINE 6.0 05/27/2013 1024   GLUCOSEU NEGATIVE 05/27/2013 1024   HGBUR MODERATE (A) 05/27/2013 1024   BILIRUBINUR NEGATIVE 05/27/2013 1024   KETONESUR NEGATIVE 05/27/2013 1024   PROTEINUR NEGATIVE 05/27/2013 1024   UROBILINOGEN 0.2 05/27/2013 1024   NITRITE NEGATIVE 05/27/2013 1024   LEUKOCYTESUR NEGATIVE 05/27/2013 1024    Radiological Exams on Admission: Dg Chest 2 View  Result Date: 02/18/2017 CLINICAL DATA:  Chest pressure, bilateral arm pain, chest pain EXAM: CHEST  2 VIEW COMPARISON:  05/27/2013 FINDINGS: The heart size and mediastinal contours are within normal limits. Both lungs are clear. The visualized skeletal structures are unremarkable. IMPRESSION: No active cardiopulmonary disease. Electronically Signed   By: Rolm Baptise M.D.   On: 02/18/2017 11:33    EKG: Independently reviewed.   Assessment/Plan Active Problems:   Atypical chest pain   Anxiety   PLAN:   Atypical chest pain:  I don't think she has angina, or ACS.   Given her elevated heart score, I will admit her OBS for cardiac r/out.  Will obtain cardiac ECHO.  I think her symptoms are exacerbated by her anxiety.  Her husband of 21 years agreed. Will continue with her Ativan.  Give ASA and obtain lipid profile.  Thank you for allowing me to participate in her care.  Good Day.    DVT  prophylaxis: SubQ Heparin.  Code Status: FULL CODE>  Family Communication: husband at bedside.  Disposition Plan: Home.  Consults called: None.  Admission status: OBS>    Erling Arrazola MD FACP. [facility]  If 7PM-7AM, please contact night-coverage [email or web address] Password [email or web address]  02/18/2017, 2:27 PM

## 2017-02-19 ENCOUNTER — Observation Stay (HOSPITAL_BASED_OUTPATIENT_CLINIC_OR_DEPARTMENT_OTHER): Payer: Self-pay

## 2017-02-19 DIAGNOSIS — I361 Nonrheumatic tricuspid (valve) insufficiency: Secondary | ICD-10-CM

## 2017-02-19 LAB — ECHOCARDIOGRAM COMPLETE
E decel time: 254 msec
E/e' ratio: 6.2
FS: 34 % (ref 28–44)
HEIGHTINCHES: 67 in
IVS/LV PW RATIO, ED: 1.25
LA vol A4C: 31.4 ml
LA vol: 35.9 mL
LADIAMINDEX: 1.61 cm/m2
LASIZE: 27 mm
LAVOLIN: 21.3 mL/m2
LEFT ATRIUM END SYS DIAM: 27 mm
LV PW d: 7.06 mm — AB (ref 0.6–1.1)
LV dias vol index: 28 mL/m2
LVDIAVOL: 46 mL (ref 46–106)
LVEEAVG: 6.2
LVEEMED: 6.2
LVELAT: 11 cm/s
LVOT VTI: 18 cm
LVOT area: 3.46 cm2
LVOT diameter: 21 mm
LVOT peak grad rest: 3 mmHg
LVOT peak vel: 79.3 cm/s
LVOTSV: 62 mL
LVSYSVOL: 17 mL
LVSYSVOLIN: 10 mL/m2
MV Dec: 254
MVPKAVEL: 57.2 m/s
MVPKEVEL: 68.2 m/s
Simpson's disk: 63
Stroke v: 29 ml
TAPSE: 21 mm
TDI e' lateral: 11
TDI e' medial: 8.05
TVMG: 248 mmHg
WEIGHTICAEL: 2118.4 [oz_av]

## 2017-02-19 LAB — TROPONIN I: Troponin I: <0.03 ng/mL (ref <0.03)

## 2017-02-19 LAB — LIPID PANEL
CHOLESTEROL: 175 mg/dL (ref 0–200)
HDL: 57 mg/dL (ref >40)
LDL Cholesterol: 103 mg/dL — ABNORMAL HIGH (ref 0–99)
Total CHOL/HDL Ratio: 3.1 RATIO
Triglycerides: 76 mg/dL (ref <150)
VLDL: 15 mg/dL (ref 0–40)

## 2017-02-19 LAB — HIV ANTIBODY (ROUTINE TESTING W REFLEX): HIV SCREEN 4TH GENERATION: NONREACTIVE

## 2017-02-19 LAB — B. BURGDORFI ANTIBODIES: B burgdorferi Ab IgG+IgM: <0.91 {ISR} (ref 0.00–0.90)

## 2017-02-19 MED ORDER — CLONAZEPAM 1 MG PO TABS: 1.0000 mg | ORAL_TABLET | Freq: Three times a day (TID) | ORAL | 0 refills | Status: DC | PRN

## 2017-02-19 NOTE — Discharge Summary (Signed)
Physician Discharge Summary  Allison Dickerson [id number] DOB: July 07, 1955 DOA: 02/18/2017  PCP: Allison Gaskins, MD  Admit date: 02/18/2017 Discharge date: 02/19/2017   Recommendations for Outpatient Follow-up:  The patient is 61 year old white female in no respiratory coronary artery disease is under multiple stressors including her mother who is dying and her husband who is significantly ill she came to the hospital with some left-sided atypical chest pain nonexertional display some anxiety negative enzymes EKGs normal echo do not feel a cyst cardiac chest pain in any way she'll perform a lot of patient uncontrollable anxiety and taking antidepressant/antianxiety medicines combined she refuses I told her she may build up tolerance to her lorazepam she sought of gradually acknowledged this and I switched to clonazepam 1 mg by mouth 3 times a day from lorazepam 1 mg by mouth 3 times a day for control of anxiety she'll come into the office within 2 weeks time to discuss the above issues she is likewise to take all prehospital admission medicines other than ones mentioned above Discharge Diagnoses:  Active Problems:   Atypical chest pain   Anxiety   Discharge Condition: Good  Filed Weights   02/18/17 1023 02/18/17 1607  Weight: 63.5 kg (140 lb) 60.1 kg (132 lb 6.4 oz)    History of present illness:  Patient 61 year old white female with a history of anxiety disorder but no known coronary risk factors who has multiple stressors at home including a mild mother who is dying and husband is seriously ill she comes in the hospital with atypical chest pain displacing some anxiety symptoms spent 24 with negative enzymes and normal echo normal EKGs I do not feel this is cardiac chest pain anyways to perform I tried to address her anxiety with a combination of antidepressant/antianxiety medicines in addition to her arrest p.m. she refuses adamantly wrist and benefits were explained she was  grudginglymay develop some resistance with her chronic lorazepam I will switch her to clonazepam 1 mg by mouth 3 times a day from lorazepam 1 mg by mouth 3 times a day to see if there is any better control of her anxiety symptoms as well as her insomnia  Hospital Course:  See history of present illness above  Procedures:   Consultations:    Discharge Instructions  Discharge Instructions    Discharge instructions    Complete by:  As directed    Discharge patient    Complete by:  As directed    Discharge disposition:  01-Home or Self Care   Discharge patient date:  02/19/2017     Allergies as of 02/19/2017   No Known Allergies     Medication List    STOP taking these medications   LORazepam 0.5 MG tablet Commonly known as:  ATIVAN     TAKE these medications   CALCIUM 600 + D PO Take 1 tablet by mouth daily.   clonazePAM 1 MG tablet Commonly known as:  KLONOPIN Take 1 tablet (1 mg total) by mouth 3 (three) times daily as needed for anxiety.   GLUCOSAMINE CHONDR COMPLEX PO Take 1 tablet by mouth daily.   naproxen sodium 220 MG tablet Commonly known as:  ANAPROX Take 220 mg by mouth 2 (two) times daily.   oxyCODONE-acetaminophen 5-325 MG tablet Commonly known as:  PERCOCET Take 1 tablet by mouth every 4 (four) hours as needed.            Discharge Care Instructions  Start     Ordered   02/19/17 0000  clonazePAM (KLONOPIN) 1 MG tablet  3 times daily PRN     02/19/17 1236   02/19/17 0000  Discharge instructions     02/19/17 1236   02/19/17 0000  Discharge patient    Question Answer Comment  Discharge disposition 01-Home or Self Care   Discharge patient date 02/19/2017      02/19/17 1236     No Known Allergies    The results of significant diagnostics from this hospitalization (including imaging, microbiology, ancillary and laboratory) are listed below for reference.    Significant Diagnostic Studies: Dg Chest 2 View  Result Date:  02/18/2017 CLINICAL DATA:  Chest pressure, bilateral arm pain, chest pain EXAM: CHEST  2 VIEW COMPARISON:  05/27/2013 FINDINGS: The heart size and mediastinal contours are within normal limits. Both lungs are clear. The visualized skeletal structures are unremarkable. IMPRESSION: No active cardiopulmonary disease. Electronically Signed   By: Rolm Baptise M.D.   On: 02/18/2017 11:33    Microbiology: No results found for this or any previous visit (from the past 240 hour(s)).   Labs: Basic Metabolic Panel:  Recent Labs Lab 02/18/17 1022  NA 140  K 3.9  CL 107  CO2 24  GLUCOSE 103*  BUN 19  CREATININE 0.89  CALCIUM 9.9   Liver Function Tests: No results for input(s): AST, ALT, ALKPHOS, BILITOT, PROT, ALBUMIN in the last 168 hours. No results for input(s): LIPASE, AMYLASE in the last 168 hours. No results for input(s): AMMONIA in the last 168 hours. CBC:  Recent Labs Lab 02/18/17 1022  WBC 5.5  HGB 13.3  HCT 38.6  MCV 91.3  PLT 233   Cardiac Enzymes:  Recent Labs Lab 02/18/17 1022 02/18/17 1702 02/18/17 2220 02/19/17 0502  TROPONINI <0.03 <0.03 <0.03 <0.03   BNP: BNP (last 3 results) No results for input(s): BNP in the last 8760 hours.  ProBNP (last 3 results) No results for input(s): PROBNP in the last 8760 hours.  CBG: No results for input(s): GLUCAP in the last 168 hours.     Signed:  Aishah Teffeteller [name]  [facility] Pager: [phone] 02/19/2017, 12:42 PM

## 2017-02-19 NOTE — Progress Notes (Signed)
*  PRELIMINARY RESULTS* Echocardiogram 2D Echocardiogram has been performed.  Allison Dickerson 02/19/2017, 9:25 AM

## 2017-02-19 NOTE — Progress Notes (Signed)
Pt discharged in stable condition into the care of her husband.  PIV removed intact w/o S&S of complications.  Discharge instructions reviewed with pt.  Pt verbalized understanding. Prescription given to pt.

## 2017-11-20 ENCOUNTER — Encounter: Payer: Self-pay | Admitting: Internal Medicine

## 2018-02-03 ENCOUNTER — Emergency Department (HOSPITAL_COMMUNITY): Payer: Self-pay

## 2018-02-03 ENCOUNTER — Encounter (HOSPITAL_COMMUNITY): Payer: Self-pay

## 2018-02-03 ENCOUNTER — Other Ambulatory Visit: Payer: Self-pay

## 2018-02-03 ENCOUNTER — Emergency Department (HOSPITAL_COMMUNITY)
Admission: EM | Admit: 2018-02-03 | Discharge: 2018-02-03 | Disposition: A | Discharge status: Home / Self Care | Payer: Self-pay | Attending: Emergency Medicine | Admitting: Emergency Medicine

## 2018-02-03 DIAGNOSIS — F17210 Nicotine dependence, cigarettes, uncomplicated: Secondary | ICD-10-CM | POA: Insufficient documentation

## 2018-02-03 DIAGNOSIS — R911 Solitary pulmonary nodule: Secondary | ICD-10-CM | POA: Insufficient documentation

## 2018-02-03 DIAGNOSIS — R197 Diarrhea, unspecified: Secondary | ICD-10-CM | POA: Insufficient documentation

## 2018-02-03 DIAGNOSIS — Z8541 Personal history of malignant neoplasm of cervix uteri: Secondary | ICD-10-CM | POA: Insufficient documentation

## 2018-02-03 DIAGNOSIS — R634 Abnormal weight loss: Secondary | ICD-10-CM | POA: Insufficient documentation

## 2018-02-03 LAB — URINALYSIS, ROUTINE W REFLEX MICROSCOPIC
Bilirubin Urine: NEGATIVE
Glucose, UA: NEGATIVE mg/dL
HGB URINE DIPSTICK: NEGATIVE
Ketones, ur: 5 mg/dL — AB
Leukocytes, UA: NEGATIVE
Nitrite: NEGATIVE
PH: 7 (ref 5.0–8.0)
Protein, ur: NEGATIVE mg/dL
SPECIFIC GRAVITY, URINE: 1.035 — AB (ref 1.005–1.030)

## 2018-02-03 LAB — COMPREHENSIVE METABOLIC PANEL
ALT: 28 U/L (ref 0–44)
AST: 25 U/L (ref 15–41)
Albumin: 4.1 g/dL (ref 3.5–5.0)
Alkaline Phosphatase: 62 U/L (ref 38–126)
Anion gap: 9 (ref 5–15)
BUN: 14 mg/dL (ref 8–23)
CHLORIDE: 106 mmol/L (ref 98–111)
CO2: 25 mmol/L (ref 22–32)
CREATININE: 0.96 mg/dL (ref 0.44–1.00)
Calcium: 9.8 mg/dL (ref 8.9–10.3)
GFR calc Af Amer: >60 mL/min (ref >60)
GFR calc non Af Amer: >60 mL/min (ref >60)
Glucose, Bld: 114 mg/dL — ABNORMAL HIGH (ref 70–99)
Potassium: 4.2 mmol/L (ref 3.5–5.1)
Sodium: 140 mmol/L (ref 135–145)
Total Bilirubin: 0.9 mg/dL (ref 0.3–1.2)
Total Protein: 7.5 g/dL (ref 6.5–8.1)

## 2018-02-03 LAB — CBC
HCT: 41.7 % (ref 36.0–46.0)
HEMOGLOBIN: 13.9 g/dL (ref 12.0–15.0)
MCH: 30.8 pg (ref 26.0–34.0)
MCHC: 33.3 g/dL (ref 30.0–36.0)
MCV: 92.5 fL (ref 78.0–100.0)
PLATELETS: 246 10*3/uL (ref 150–400)
RBC: 4.51 MIL/uL (ref 3.87–5.11)
RDW: 13.2 % (ref 11.5–15.5)
WBC: 6.1 10*3/uL (ref 4.0–10.5)

## 2018-02-03 LAB — LIPASE, BLOOD: Lipase: 33 U/L (ref 11–51)

## 2018-02-03 MED ORDER — DICYCLOMINE HCL 20 MG PO TABS: 20.0000 mg | ORAL_TABLET | Freq: Two times a day (BID) | ORAL | 0 refills | Status: DC

## 2018-02-03 MED ORDER — SODIUM CHLORIDE 0.9 % IV BOLUS
1000.0000 mL | Freq: Once | INTRAVENOUS | Status: AC
  Administered 2018-02-03: 1000 mL via INTRAVENOUS

## 2018-02-03 MED ORDER — ONDANSETRON HCL 4 MG/2ML IJ SOLN
4.0000 mg | Freq: Once | INTRAMUSCULAR | Status: AC
  Administered 2018-02-03: 4 mg via INTRAVENOUS
  Filled 2018-02-03: qty 2

## 2018-02-03 MED ORDER — IOPAMIDOL (ISOVUE-300) INJECTION 61%
100.0000 mL | Freq: Once | INTRAVENOUS | Status: AC | PRN
  Administered 2018-02-03: 100 mL via INTRAVENOUS

## 2018-02-03 NOTE — Discharge Instructions (Addendum)
You were evaluated in the Emergency Department and after careful evaluation, we did not find any emergent condition requiring admission or further testing in the hospital.  We did not find an obvious reason for your diarrhea and weight loss today.  It would be best to try the dietary changes that we discussed, and follow-up with a primary care provider for further testing.  You can try the prescription provided as needed for abdominal pain.  There was a small lung nodule found on today's CT, which should be followed up with further imaging per the recommendations included below.  A primary care provider would be the best person to schedule this.  CT ABDOMEN RESULTS 02/03/2018 1. No acute findings are noted in the abdomen or pelvis. 2. No significant colonic diverticular disease identified. 3. New 7 mm nodule in the periphery of the left lower lobe. Non-contrast chest CT at 6-12 months is recommended. If the nodule is stable at time of repeat CT, then future CT at 18-24 months (from today's scan) is considered optional for low-risk patients, but is recommended for high-risk patients. This recommendation follows the consensus statement: Guidelines for Management of Incidental Pulmonary Nodules Detected on CT Images: From the Fleischner Society 2017; Radiology 2017; 284:228-243.  Please return to the Emergency Department if you experience any worsening of your condition.  We encourage you to follow up with a primary care provider.  Thank you for allowing [place] to be a part of your care.

## 2018-02-03 NOTE — ED Provider Notes (Signed)
[facility] Emergency Department Provider Note MRN:  [id number]  Arrival date & time: 02/03/18     Chief Complaint   Diarrhea   History of Present Illness   Allison Dickerson is a 62 y.o. year-old female with a history of cervical cancer, diverticulitis presenting to the ED with chief complaint of diarrhea.  Reporting 2 to 3 weeks of persistent, watery, "explosive" diarrhea.  10 pound weight loss during this time, also endorsing 20 more pounds of unintentional weight loss over the past 6 months.  Endorsing mild to moderate lower abdominal cramping pain, constant for the past 2 weeks, worsened with bowel movements.  Denies any fevers, does endorse persistent nausea, no vomiting, no chest pain, no shortness of breath.  Occasional dysuria, no vaginal bleeding or discharge.  Review of Systems  A complete 10 system review of systems was obtained and all systems are negative except as noted in the HPI and PMH.   Patient's Health History    Past Medical History:  Diagnosis Date  . Anxiety   . Cervical cancer ([place])    When younger.  . Diverticulitis   . Pinched nerve    right hand    Past Surgical History:  Procedure Laterality Date  . ABDOMINAL HYSTERECTOMY    . cervical cancer removal      Family History  Problem Relation Age of Onset  . Cancer Father   . Cancer Other     Social History   Socioeconomic History  . Marital status: Married    Spouse name: Not on file  . Number of children: Not on file  . Years of education: Not on file  . Highest education level: Not on file  Occupational History  . Not on file  Social Needs  . Financial resource strain: Not on file  . Food insecurity:    Worry: Not on file    Inability: Not on file  . Transportation needs:    Medical: Not on file    Non-medical: Not on file  Tobacco Use  . Smoking status: Current Every Day Smoker    Packs/day: 0.50    Years: 20.00    Pack years: 10.00    Types: Cigarettes  .  Smokeless tobacco: Never Used  Substance and Sexual Activity  . Alcohol use: No  . Drug use: Yes    Types: Marijuana    Comment: occ  . Sexual activity: Not on file  Lifestyle  . Physical activity:    Days per week: Not on file    Minutes per session: Not on file  . Stress: Not on file  Relationships  . Social connections:    Talks on phone: Not on file    Gets together: Not on file    Attends religious service: Not on file    Active member of club or organization: Not on file    Attends meetings of clubs or organizations: Not on file    Relationship status: Not on file  . Intimate partner violence:    Fear of current or ex partner: Not on file    Emotionally abused: Not on file    Physically abused: Not on file    Forced sexual activity: Not on file  Other Topics Concern  . Not on file  Social History Narrative  . Not on file     Physical Exam  Vital Signs and Nursing Notes reviewed Vitals:   02/03/18 1200 02/03/18 1302  BP: 112/80 133/74  Pulse:  69 76  Resp:  16  Temp:    SpO2: 100% 100%    CONSTITUTIONAL: Pale-appearing, NAD NEURO:  Alert and oriented x 3, no focal deficits EYES:  eyes equal and reactive ENT/NECK:  no LAD, no JVD CARDIO: Regular rate, well-perfused, normal S1 and S2 PULM:  CTAB no wheezing or rhonchi GI/GU:  normal bowel sounds, non-distended, non-tender MSK/SPINE:  No gross deformities, no edema SKIN:  no rash, atraumatic PSYCH:  Appropriate speech and behavior  Diagnostic and Interventional Summary    EKG Interpretation  Date/Time:    Ventricular Rate:    PR Interval:    QRS Duration:   QT Interval:    QTC Calculation:   R Axis:     Text Interpretation:        Labs Reviewed  COMPREHENSIVE METABOLIC PANEL - Abnormal; Notable for the following components:      Result Value   Glucose, Bld 114 (*)    All other components within normal limits  URINALYSIS, ROUTINE W REFLEX MICROSCOPIC - Abnormal; Notable for the following  components:   Specific Gravity, Urine 1.035 (*)    Ketones, ur 5 (*)    All other components within normal limits  CBC  LIPASE, BLOOD    CT ABDOMEN PELVIS W CONTRAST  Final Result      Medications  sodium chloride 0.9 % bolus 1,000 mL (0 mLs Intravenous Stopped 02/03/18 1351)  ondansetron (ZOFRAN) injection 4 mg (4 mg Intravenous Given 02/03/18 1138)  iopamidol (ISOVUE-300) 61 % injection 100 mL (100 mLs Intravenous Contrast Given 02/03/18 1242)     Procedures Critical Care  ED Course and Medical Decision Making  I have reviewed the triage vital signs and the nursing notes.  Pertinent labs & imaging results that were available during my care of the patient were reviewed by me and considered in my medical decision making (see below for details). Clinical Course as of Feb 04 1356  Tue Feb 03, 2018  1122 Persistent watery diarrhea, lower abdominal pain 62 year old female, history of diverticulitis, history of cervical cancer.  Also with fairly significant unintentional weight loss.  Vital signs stable, well-appearing, abdomen is largely soft and nontender.  No recent antibiotics to suggest C. difficile, no blood in the stool, no recent travel.  Low concern for diverticulitis, but given the unintentional weight loss and possibility of malignancy, will CT today.   [MB]    Clinical Course User Index [MB] Maudie Flakes, MD    CT with no acute process, patient informed a small lung nodule that should be followed up.  Discussed the possibility of a malabsorptive diarrhea given the patient's unintentional weight loss.  Will attempt dietary change and follow-up with a primary care provider for further testing.  Given prescription for Bentyl.  After the discussed management above, the patient was determined to be safe for discharge.  The patient was in agreement with this plan and all questions regarding their care were answered.  ED return precautions were discussed and the patient will return  to the ED with any significant worsening of condition.  Barth Kirks. [place]onia Small, [place] [email or web address]  Final Clinical Impressions(s) / ED Diagnoses     ICD-10-CM   1. Diarrhea, unspecified type R19.7   2. Unintentional weight loss R63.4   3. Lung nodule seen on imaging study R91.1     ED Discharge Orders         Ordered    dicyclomine (BENTYL)  20 MG tablet  2 times daily     02/03/18 1353             Maudie Flakes, MD 02/03/18 1357

## 2018-02-03 NOTE — ED Triage Notes (Signed)
Pt has had diarrhea for 2 weeks straight non stop. States it will slow down and is loose. Pt has a history of diverticulitis and has lost 20 lbs in 3 months. Was put on a probiotic pill last Monday. Is weak and has no energy. Is started to get nauseated and shaky

## 2018-03-17 ENCOUNTER — Emergency Department (HOSPITAL_COMMUNITY): Payer: Self-pay

## 2018-03-17 ENCOUNTER — Encounter (HOSPITAL_COMMUNITY): Payer: Self-pay | Admitting: Emergency Medicine

## 2018-03-17 ENCOUNTER — Emergency Department (HOSPITAL_COMMUNITY)
Admission: EM | Admit: 2018-03-17 | Discharge: 2018-03-17 | Disposition: A | Discharge status: Home / Self Care | Payer: Self-pay | Attending: Emergency Medicine | Admitting: Emergency Medicine

## 2018-03-17 ENCOUNTER — Other Ambulatory Visit: Payer: Self-pay

## 2018-03-17 DIAGNOSIS — M542 Cervicalgia: Secondary | ICD-10-CM | POA: Insufficient documentation

## 2018-03-17 DIAGNOSIS — G9009 Other idiopathic peripheral autonomic neuropathy: Secondary | ICD-10-CM | POA: Insufficient documentation

## 2018-03-17 DIAGNOSIS — M79601 Pain in right arm: Secondary | ICD-10-CM | POA: Insufficient documentation

## 2018-03-17 DIAGNOSIS — M25531 Pain in right wrist: Secondary | ICD-10-CM | POA: Insufficient documentation

## 2018-03-17 DIAGNOSIS — R0602 Shortness of breath: Secondary | ICD-10-CM | POA: Insufficient documentation

## 2018-03-17 DIAGNOSIS — Z87891 Personal history of nicotine dependence: Secondary | ICD-10-CM | POA: Insufficient documentation

## 2018-03-17 LAB — COMPREHENSIVE METABOLIC PANEL
ALK PHOS: 94 U/L (ref 38–126)
ALT: 39 U/L (ref 0–44)
AST: 30 U/L (ref 15–41)
Albumin: 4.1 g/dL (ref 3.5–5.0)
Anion gap: 9 (ref 5–15)
BUN: 11 mg/dL (ref 8–23)
CO2: 23 mmol/L (ref 22–32)
CREATININE: 0.89 mg/dL (ref 0.44–1.00)
Calcium: 9.9 mg/dL (ref 8.9–10.3)
Chloride: 108 mmol/L (ref 98–111)
GFR calc Af Amer: >60 mL/min (ref >60)
Glucose, Bld: 120 mg/dL — ABNORMAL HIGH (ref 70–99)
POTASSIUM: 3.9 mmol/L (ref 3.5–5.1)
Sodium: 140 mmol/L (ref 135–145)
TOTAL PROTEIN: 7.9 g/dL (ref 6.5–8.1)
Total Bilirubin: 0.9 mg/dL (ref 0.3–1.2)

## 2018-03-17 LAB — CBC WITH DIFFERENTIAL/PLATELET
BASOS ABS: 0.1 10*3/uL (ref 0.0–0.1)
Basophils Relative: 1 %
EOS ABS: 0.3 10*3/uL (ref 0.0–0.7)
EOS PCT: 5 %
HCT: 41.5 % (ref 36.0–46.0)
Hemoglobin: 14.1 g/dL (ref 12.0–15.0)
Lymphocytes Relative: 24 %
Lymphs Abs: 1.5 10*3/uL (ref 0.7–4.0)
MCH: 31.5 pg (ref 26.0–34.0)
MCHC: 34 g/dL (ref 30.0–36.0)
MCV: 92.6 fL (ref 78.0–100.0)
Monocytes Absolute: 0.4 10*3/uL (ref 0.1–1.0)
Monocytes Relative: 6 %
Neutro Abs: 4 10*3/uL (ref 1.7–7.7)
Neutrophils Relative %: 64 %
PLATELETS: 287 10*3/uL (ref 150–400)
RBC: 4.48 MIL/uL (ref 3.87–5.11)
RDW: 12.7 % (ref 11.5–15.5)
WBC: 6.1 10*3/uL (ref 4.0–10.5)

## 2018-03-17 LAB — I-STAT TROPONIN, ED: TROPONIN I, POC: 0 ng/mL (ref 0.00–0.08)

## 2018-03-17 MED ORDER — ALBUTEROL SULFATE HFA 108 (90 BASE) MCG/ACT IN AERS
2.0000 | INHALATION_SPRAY | Freq: Once | RESPIRATORY_TRACT | Status: AC
  Administered 2018-03-17: 2 via RESPIRATORY_TRACT
  Filled 2018-03-17: qty 6.7

## 2018-03-17 MED ORDER — KETOROLAC TROMETHAMINE 30 MG/ML IJ SOLN
30.0000 mg | Freq: Once | INTRAMUSCULAR | Status: AC
  Administered 2018-03-17: 30 mg via INTRAVENOUS
  Filled 2018-03-17: qty 1

## 2018-03-17 MED ORDER — ALBUTEROL SULFATE HFA 108 (90 BASE) MCG/ACT IN AERS: 1.0000 | INHALATION_SPRAY | Freq: Four times a day (QID) | RESPIRATORY_TRACT | 0 refills | Status: DC | PRN

## 2018-03-17 NOTE — ED Provider Notes (Signed)
Emergency Department Provider Note   I have reviewed the triage vital signs and the nursing notes.   HISTORY  Chief Complaint Shortness of Breath   HPI Allison Dickerson is a 62 y.o. female with PMH of anxiety, diverticulitis, and peripheral neuropathy in the right hand urgency department with shortness of breath.  Patient states that she is had shortness of breath since yesterday but this is worsened.  She has had similar symptoms in the past and has noticed some increased nasal congestion.  Denies any chest pain, heart palpitations, lightheadedness.  She does not smoke cigarettes.  She has not taken any over-the-counter medications. No abdominal pain.   Patient also concerned about increasing pain in the right arm/wrist.  She feels pain with movement of the hand and numbness over the entire hand starting at the wrist.  She has had intermittent symptoms over the past 2 years and states she is spoken to her PCP regarding this.  She has not followed with a neurologist or orthopedist.  She is having some similar symptoms in the left thumb but symptoms are more severe on the right.  No other modifying factors.  She does feel some neck tightness radiating to the right shoulder as well.  She is tried Tylenol at home along with heat compress.    Past Medical History:  Diagnosis Date  . Anxiety   . Cervical cancer ([place])    When younger.  . Diverticulitis   . Pinched nerve    right hand    Patient Active Problem List   Diagnosis Date Noted  . Atypical chest pain 02/18/2017  . Anxiety 02/18/2017    Past Surgical History:  Procedure Laterality Date  . ABDOMINAL HYSTERECTOMY    . cervical cancer removal     Allergies Gluten meal  Family History  Problem Relation Age of Onset  . Cancer Father   . Cancer Other     Social History Social History   Tobacco Use  . Smoking status: Former Smoker    Packs/day: 0.50    Years: 20.00    Pack years: 10.00    Types: Cigarettes  .  Smokeless tobacco: Never Used  Substance Use Topics  . Alcohol use: No  . Drug use: Yes    Types: Marijuana    Comment: occ    Review of Systems  Constitutional: No fever/chills Eyes: No visual changes. ENT: No sore throat. Cardiovascular: Denies chest pain. Respiratory: Positive shortness of breath. Gastrointestinal: No abdominal pain.  No nausea, no vomiting.  No diarrhea.  No constipation. Genitourinary: Negative for dysuria. Musculoskeletal: Negative for back pain. Positive neck pain/stiffness.  Skin: Negative for rash. Neurological: Negative for headaches, focal weakness. Positive right arm pain and right hand numbness (acute on chronic).   10-point ROS otherwise negative.  ____________________________________________   PHYSICAL EXAM:  VITAL SIGNS: ED Triage Vitals  Enc Vitals Group     BP 03/17/18 1123 127/76     Pulse Rate 03/17/18 1123 81     Resp 03/17/18 1123 18     Temp 03/17/18 1123 98.8 F (37.1 C)     Temp Source 03/17/18 1123 Oral     SpO2 03/17/18 1123 99 %     Weight 03/17/18 1121 119 lb 0.8 oz (54 kg)     Height 03/17/18 1121 5\' 7"  (1.702 m)     Pain Score 03/17/18 1120 7   Constitutional: Alert and oriented. Well appearing and in no acute distress. Eyes: Conjunctivae are normal.  PERRL. Head: Atraumatic. Nose: No congestion/rhinnorhea. Mouth/Throat: Mucous membranes are moist.  Oropharynx non-erythematous. Neck: No stridor. No cervical spine tenderness to palpation. Some muscle tightness to palpation of the right lateral neck.  Cardiovascular: Normal rate, regular rhythm. Good peripheral circulation. Grossly normal heart sounds.   Respiratory: Normal respiratory effort.  No retractions. Lungs CTAB. Gastrointestinal: Soft and nontender. No distention.  Musculoskeletal: No lower extremity tenderness nor edema. No gross deformities of extremities.  Neurologic:  Normal speech and language. Subjective numbness in glove pattern over the right hand with  decreased grip strength on the right when compared to the left hand.  Skin:  Skin is warm, dry and intact. No rash noted.  ____________________________________________   LABS (all labs ordered are listed, but only abnormal results are displayed)  Labs Reviewed  COMPREHENSIVE METABOLIC PANEL - Abnormal; Notable for the following components:      Result Value   Glucose, Bld 120 (*)    All other components within normal limits  CBC WITH DIFFERENTIAL/PLATELET  I-STAT TROPONIN, ED   ____________________________________________  EKG   EKG Interpretation  Date/Time:  Tuesday March 17 2018 11:21:27 EDT Ventricular Rate:  86 PR Interval:    QRS Duration: 92 QT Interval:  353 QTC Calculation: 423 R Axis:   81 Text Interpretation:  Sinus rhythm Borderline right axis deviation Low voltage, precordial leads Borderline T wave abnormalities No STEMI.  Confirmed by Nanda Quinton [phone]) on 03/17/2018 11:56:26 AM       ____________________________________________  RADIOLOGY  Dg Chest 2 View  Result Date: 03/17/2018 CLINICAL DATA:  Shortness of breath. EXAM: CHEST - 2 VIEW COMPARISON:  02/18/2017. FINDINGS: Mediastinum and hilar structures normal. Questionable nodular density noted in the left lung base. Although this may just represent mild atelectasis follow-up chest x-ray suggested to demonstrate resolution to exclude a fixed focal lesion no pleural effusion or pneumothorax. No acute bony abnormality. IMPRESSION: 1. Questionable nodular density none the left lung base. On this may just represent mild atelectasis, follow-up chest x-ray suggested demonstrate resolution to exclude a fixed focal lesion. 2.  Exam otherwise unremarkable. Electronically Signed   By: Marcello Moores  Register   On: 03/17/2018 12:10   Dg Wrist Complete Right  Result Date: 03/17/2018 CLINICAL DATA:  Right wrist pain, no injury EXAM: RIGHT WRIST - COMPLETE 3+ VIEW COMPARISON:  None. FINDINGS: The right radiocarpal joint  space appears normal and the ulnar styloid is intact. There is faint chondrocalcinosis which could indicate CPPD arthropathy. There is significant degenerative change involving the radial aspect of the wrist primarily the articulation of the navicula with the trapezium. No acute fracture is seen. Alignment is normal. Intercarpal joint spaces appear normal. IMPRESSION: 1. Degenerative change along the radial aspect of the wrist. 2. 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 Electronically Signed   By: Ivar Drape M.D.   On: 03/17/2018 13:48   Ct Cervical Spine Wo Contrast  Result Date: 03/17/2018 CLINICAL DATA:  Neck pain radiating to the right fingers with numbness, beginning 2 days ago. EXAM: CT CERVICAL SPINE WITHOUT CONTRAST TECHNIQUE: Multidetector CT imaging of the cervical spine was performed without intravenous contrast. Multiplanar CT image reconstructions were also generated. COMPARISON:  None. FINDINGS: Alignment: Straightening of the normal cervical lordosis. Mild scoliotic curvature convex to the right. Skull base and vertebrae: No primary bone pathology. Os odontoideum at the apex of the dens with mild degenerative change. Soft tissues and spinal canal: Negative Disc levels: Foramen magnum is sufficiently patent. Degenerative changes at the C1-2 articulation.  C2-3: Normal C3-4: Facet osteoarthritis on the left. Bilateral uncovertebral osteophytes. Mild bilateral foraminal narrowing. 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 Shallow protrusion of the disc. Bilateral uncovertebral prominence. Bilateral foraminal narrowing left worse than right could affect the C5 nerves. C5-6: Spondylosis with endplate osteophytes and bulging of the disc. Bilateral foraminal stenosis could compress either C6 nerve. C6-7: Spondylosis with endplate osteophytes. Bilateral foraminal stenosis could affect either C7 nerve. C7-T1: Spondylosis with bilateral osteophytes. Bilateral foraminal stenosis could compress  either C8 nerve. T1-2: Mild non-compressive degenerative changes. T2-3 shows bilateral facet osteoarthritis. Upper chest: Negative Other: None IMPRESSION: C3-4: Bilateral foraminal narrowing because of uncovertebral osteophytes and left-sided facet degeneration. Some potential to affect either C4 nerve. C4-5: Bilateral uncovertebral osteophytes, shallow broad-based disc herniation and left-sided facet arthropathy. Foraminal stenosis could affect either C5 nerve. C5-6: Bilateral uncovertebral osteophytes cause foraminal narrowing that could affect either C6 nerve. C6-7: Bilateral uncovertebral osteophytes cause foraminal narrowing that could affect either C7 nerve. C7-T1: Bilateral uncovertebral osteophytes cause foraminal narrowing that could affect either C8 nerve. Electronically Signed   By: Nelson Chimes M.D.   On: 03/17/2018 12:54    ____________________________________________   PROCEDURES  Procedure(s) performed:   Procedures  None ____________________________________________   INITIAL IMPRESSION / ASSESSMENT AND PLAN / ED COURSE  Pertinent labs & imaging results that were available during my care of the patient were reviewed by me and considered in my medical decision making (see chart for details).  Patient presents to the emergency department with what appears to be 2 separate complaints.  She is complaining of shortness of breath with nasal congestion.  Normal oxygen saturation and exam.  Plan to try albuterol inhaler and obtain chest x-ray.  She has a known left lower lobe nodule which is followed by her PCP.  She has planned for outpatient surveillance.  The patient's neck and arm/hand symptoms seem most consistent with radicular pain.  She tells me that the hand symptoms have been intermittent over the past 2 years.  Plan for CT imaging of the cervical spine in addition to labs.   2:12 PM CT imaging reviewed which shows diffuse foraminal narrowing.  On reevaluation patient has  equal grip strength and is right-hand with normal dexterity.  Patient's symptoms may be combination of arthritis in the right wrist, peripheral nerve compression, and some radicular symptoms.  I do not feel she warrants emergent MRI of the cervical spine at this time.  I have discussed these findings with her and her husband and advised that she follow with her primary care physician.  I am also provided contact information for local neurology.  Patient's breathing symptoms are improved with albuterol.  Chest x-ray and wrist x-ray reviewed with no acute findings.  Patient's labs are unremarkable.  EKG normal.   At this time, I do not feel there is any life-threatening condition present. I have reviewed and discussed all results (EKG, imaging, lab, urine as appropriate), exam findings with patient. I have reviewed nursing notes and appropriate previous records.  I feel the patient is safe to be discharged home without further emergent workup. Discussed usual and customary return precautions. Patient and family (if present) verbalize understanding and are comfortable with this plan.  Patient will follow-up with their primary care provider. If they do not have a primary care provider, information for follow-up has been provided to them. All questions have been answered.  ____________________________________________  FINAL CLINICAL IMPRESSION(S) / ED DIAGNOSES  Final diagnoses:  Shortness of breath  Neck  pain  Right arm pain  Right wrist pain     MEDICATIONS GIVEN DURING THIS VISIT:  Medications  albuterol (PROVENTIL HFA;VENTOLIN HFA) 108 (90 Base) MCG/ACT inhaler 2 puff (2 puffs Inhalation Given 03/17/18 1205)  ketorolac (TORADOL) 30 MG/ML injection 30 mg (30 mg Intravenous Given 03/17/18 1210)     NEW OUTPATIENT MEDICATIONS STARTED DURING THIS VISIT:  New Prescriptions   ALBUTEROL (PROVENTIL HFA;VENTOLIN HFA) 108 (90 BASE) MCG/ACT INHALER    Inhale 1-2 puffs into the lungs every 6 (six) hours  as needed for wheezing or shortness of breath.    Note:  This document was prepared using Dragon voice recognition software and may include unintentional dictation errors.  Nanda Quinton, MD Emergency Medicine    Falisa Lamora, [facility], MD 03/17/18 1414

## 2018-03-17 NOTE — ED Triage Notes (Signed)
Sob since yesterday that has gotten worse.   Pt reports since  Sunday neck pain that radiates through to rt fingertips and causing numbness in fingertips .

## 2018-03-17 NOTE — ED Notes (Signed)
Pt returned from CT °

## 2018-03-17 NOTE — Discharge Instructions (Signed)
You were seen in the ED today with shortness of breath and neck/arm pain. Your CT neck shows some degenerative changes which could be causing your arm pain. Talk to your PCP further about this and decide on the utility of further treatment or additional imaging. Call the Neurologist listed to discuss further. Return to the ED with any new or worsening symptoms. Try the albuterol inh prescribed if shortness of breath symptoms worsen.

## 2018-03-17 NOTE — ED Notes (Signed)
Patient transported to X-ray 

## 2018-03-17 NOTE — ED Notes (Signed)
Pt returned from xray

## 2019-04-12 ENCOUNTER — Ambulatory Visit: Payer: Medicaid Other

## 2019-04-12 ENCOUNTER — Other Ambulatory Visit: Payer: Self-pay

## 2019-04-12 ENCOUNTER — Ambulatory Visit: Payer: Medicaid Other | Admitting: Orthopedic Surgery

## 2019-04-12 ENCOUNTER — Encounter: Payer: Self-pay | Admitting: Orthopedic Surgery

## 2019-04-12 VITALS — BP 120/87 | HR 92 | Ht 67.0 in | Wt 118.0 lb

## 2019-04-12 DIAGNOSIS — M79641 Pain in right hand: Secondary | ICD-10-CM

## 2019-04-12 DIAGNOSIS — M19041 Primary osteoarthritis, right hand: Secondary | ICD-10-CM

## 2019-04-12 DIAGNOSIS — M542 Cervicalgia: Secondary | ICD-10-CM

## 2019-04-12 DIAGNOSIS — M79642 Pain in left hand: Secondary | ICD-10-CM

## 2019-04-12 DIAGNOSIS — G5601 Carpal tunnel syndrome, right upper limb: Secondary | ICD-10-CM | POA: Diagnosis not present

## 2019-04-12 NOTE — Patient Instructions (Addendum)
You may have carpal tunnel as well as   (NERVE STUDY WILL BE ARRANGED)  Arthritis, you will need rheumatology appointment too   Carpal Tunnel Syndrome  Carpal tunnel syndrome is a condition that causes pain in your hand and arm. The carpal tunnel is a narrow area that is on the palm side of your wrist. Repeated wrist motion or certain diseases may cause swelling in the tunnel. This swelling can pinch the main nerve in the wrist (median nerve). What are the causes? This condition may be caused by:  Repeated wrist motions.  Wrist injuries.  Arthritis.  A sac of fluid (cyst) or abnormal growth (tumor) in the carpal tunnel.  Fluid buildup during pregnancy. Sometimes the cause is not known. What increases the risk? The following factors may make you more likely to develop this condition:  Having a job in which you move your wrist in the same way many times. This includes jobs like being a Software engineer or a Scientist, water quality.  Being a woman.  Having other health conditions, such as: ? Diabetes. ? Obesity. ? A thyroid gland that is not active enough (hypothyroidism). ? Kidney failure. What are the signs or symptoms? Symptoms of this condition include:  A tingling feeling in your fingers.  Tingling or a loss of feeling (numbness) in your hand.  Pain in your entire arm. This pain may get worse when you bend your wrist and elbow for a long time.  Pain in your wrist that goes up your arm to your shoulder.  Pain that goes down into your palm or fingers.  A weak feeling in your hands. You may find it hard to grab and hold items. You may feel worse at night. How is this diagnosed? This condition is diagnosed with a medical history and physical exam. You may also have tests, such as:  Electromyogram (EMG). This test checks the signals that the nerves send to the muscles.  Nerve conduction study. This test checks how well signals pass through your nerves.  Imaging tests, such as X-rays,  ultrasound, and MRI. These tests check for what might be the cause of your condition. How is this treated? This condition may be treated with:  Lifestyle changes. You will be asked to stop or change the activity that caused your problem.  Doing exercise and activities that make bones and muscles stronger (physical therapy).  Learning how to use your hand again (occupational therapy).  Medicines for pain and swelling (inflammation). You may have injections in your wrist.  A wrist splint.  Surgery. Follow these instructions at home: If you have a splint:  Wear the splint as told by your doctor. Remove it only as told by your doctor.  Loosen the splint if your fingers: ? Tingle. ? Lose feeling (become numb). ? Turn cold and blue.  Keep the splint clean.  If the splint is not waterproof: ? Do not let it get wet. ? Cover it with a watertight covering when you take a bath or a shower. Managing pain, stiffness, and swelling   If told, put ice on the painful area: ? If you have a removable splint, remove it as told by your doctor. ? Put ice in a plastic bag. ? Place a towel between your skin and the bag. ? Leave the ice on for 20 minutes, 2-3 times per day. General instructions  Take over-the-counter and prescription medicines only as told by your doctor.  Rest your wrist from any activity that may cause pain.  If needed, talk with your boss at work about changes that can help your wrist heal.  Do any exercises as told by your doctor, physical therapist, or occupational therapist.  Keep all follow-up visits as told by your doctor. This is important. Contact a doctor if:  You have new symptoms.  Medicine does not help your pain.  Your symptoms get worse. Get help right away if:  You have very bad numbness or tingling in your wrist or hand. Summary  Carpal tunnel syndrome is a condition that causes pain in your hand and arm.  It is often caused by repeated wrist  motions.  Lifestyle changes and medicines are used to treat this problem. Surgery may help in very bad cases.  Follow your doctor's instructions about wearing a splint, resting your wrist, keeping follow-up visits, and calling for help. This information is not intended to replace advice given to you by your health care provider. Make sure you discuss any questions you have with your health care provider. Document Released: 05/23/2011 Document Revised: 10/10/2017 Document Reviewed: 10/10/2017 Elsevier Patient Education  2020 [employer].

## 2019-04-12 NOTE — Progress Notes (Signed)
Allison Dickerson  04/12/2019  HISTORY SECTION :  Chief Complaint  Patient presents with  . Neck Pain    states neck pain into both hands/ arms    Allison Dickerson is 63 years old she comes in with a chief complaint of neck pain and pain and paresthesias in the small ring and long finger with stiffness of both hands decreased range of motion and arthritic type pain bilaterally with pain and swelling of the wrist joint over approximately 1 years time the pain actually radiates from the wrist proximally no pain from the neck distally  Prior treatment includes naproxen for arthritis  She has a CT scan of her neck which shows degenerative disc disease report to be filed below   Review of Systems  Musculoskeletal: Positive for joint pain.  Neurological: Positive for tingling and headaches.  Psychiatric/Behavioral: Positive for suicidal ideas.  All other systems reviewed and are negative.    has a past medical history of Anxiety, Cervical cancer ([place]), Diverticulitis, and Pinched nerve.   Past Surgical History:  Procedure Laterality Date  . ABDOMINAL HYSTERECTOMY    . cervical cancer removal      Body mass index is 18.48 kg/m.   Allergies  Allergen Reactions  . Gluten Meal Diarrhea     Current Outpatient Medications:  .  clonazePAM (KLONOPIN) 1 MG tablet, Take 1 tablet (1 mg total) by mouth 3 (three) times daily as needed for anxiety. (Patient taking differently: Take 1 mg by mouth 3 (three) times daily. ), Disp: 90 tablet, Rfl: 0 .  Multiple Vitamins-Calcium (ONE-A-DAY WOMENS PO), Take 1 tablet by mouth daily., Disp: , Rfl:  .  naproxen sodium (ANAPROX) 220 MG tablet, Take 220 mg by mouth daily as needed (FOR PAIN). , Disp: , Rfl:  .  albuterol (PROVENTIL HFA;VENTOLIN HFA) 108 (90 Base) MCG/ACT inhaler, Inhale 1-2 puffs into the lungs every 6 (six) hours as needed for wheezing or shortness of breath. (Patient not taking: Reported on 04/12/2019), Disp: 1 Inhaler, Rfl: 0 .   dicyclomine (BENTYL) 20 MG tablet, Take 1 tablet (20 mg total) by mouth 2 (two) times daily. (Patient not taking: Reported on 04/12/2019), Disp: 20 tablet, Rfl: 0   PHYSICAL EXAM SECTION: 1) BP 120/87   Pulse 92   Ht 5\' 7"  (1.702 m)   Wt 118 lb (53.5 kg)   BMI 18.48 kg/m   Body mass index is 18.48 kg/m. General appearance: Well-developed well-nourished no gross deformities  2) Cardiovascular normal pulse and perfusion in the upper  extremities normal color without edema  3) Neurologically deep tendon reflexes are equal and normal, no sensation loss or deficits no pathologic reflexes  4) Psychological: Awake alert and oriented x3 mood and affect normal  5) Skin no lacerations or ulcerations no nodularity no palpable masses, no erythema or nodularity  6) Musculoskeletal:   Both hands have severe arthritic nodules in the PIP and DIP joints with decreased range of motion decreased grip to the left but overall decreased grip bilaterally decreased range of motion bilaterally pain and swelling of the wrist joints at the radial carpal and radial ulnar joints right greater than left with painful range of motion and decreased range of motion of the joints  Reflexes are 2+ and equal at the elbow in each right and left side  The neck was nontender with a negative Spurling sign   MEDICAL DECISION SECTION:  Encounter Diagnoses  Name Primary?  . Neck pain Yes  . Carpal tunnel  syndrome of right wrist   . Arthritis of finger of right hand   . Bilateral hand pain     Imaging No images were taken  Plan:  (Rx., Inj., surg., Frx, MRI/CT, XR:2)  #1 recommend nerve conduction study for carpal tunnel  2.  Recommend rheumatology follow-up  3.  No surgery needed on her neck at this time  CT scan showed C2-3: Normal   C3-4: Facet osteoarthritis on the left. Bilateral uncovertebral osteophytes. Mild bilateral foraminal narrowing.   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 Shallow  protrusion of the disc. Bilateral uncovertebral prominence. Bilateral foraminal narrowing left worse than right could affect the C5 nerves.   C5-6: Spondylosis with endplate osteophytes and bulging of the disc. Bilateral foraminal stenosis could compress either C6 nerve.   C6-7: Spondylosis with endplate osteophytes. Bilateral foraminal stenosis could affect either C7 nerve.   C7-T1: Spondylosis with bilateral osteophytes. Bilateral foraminal stenosis could compress either C8 nerve.   T1-2: Mild non-compressive degenerative changes. T2-3 shows bilateral facet osteoarthritis.   Upper chest: Negative   Other: None   IMPRESSION: C3-4: Bilateral foraminal narrowing because of uncovertebral osteophytes and left-sided facet degeneration. Some potential to affect either C4 nerve.   C4-5: Bilateral uncovertebral osteophytes, shallow broad-based disc herniation and left-sided facet arthropathy. Foraminal stenosis could affect either C5 nerve.   C5-6: Bilateral uncovertebral osteophytes cause foraminal narrowing that could affect either C6 nerve.   C6-7: Bilateral uncovertebral osteophytes cause foraminal narrowing that could affect either C7 nerve.   C7-T1: Bilateral uncovertebral osteophytes cause foraminal narrowing that could affect either C8 nerve.     Electronically Signed   By: Nelson Chimes M.D.   On: 03/17/2018 12:54   Plain cervical spine film shows mid cervical uncovertebral joint arthrosis, film dated April 12, 2019  12:04 PM Allison Abbott, MD  04/12/2019

## 2019-05-21 ENCOUNTER — Telehealth: Payer: Self-pay | Admitting: Orthopedic Surgery

## 2019-05-21 NOTE — Telephone Encounter (Signed)
Patient's Nerve conduction study results received per Dr Merlene Laughter, [facility], and in Dr's box.  AVS indicates Dr Aline Brochure will call patient with results.

## 2019-05-25 ENCOUNTER — Telehealth: Payer: Self-pay | Admitting: Orthopedic Surgery

## 2019-05-25 NOTE — Telephone Encounter (Signed)
[facility] neurology dated December 3 printed electrodiagnostic evidence of moderately severe right median neuropathy at the wrist and mild left median neuropathy at the wrist;    HAD TO LEAVE MESSAGE

## 2019-06-08 IMAGING — CT CT CERVICAL SPINE W/O CM
3 of 4 series · 10 of 33 positions shown, 12 images · non-contrast
Comparison: None.

CLINICAL DATA: Neck pain radiating to the right fingers with
numbness, beginning 2 days ago.

EXAM:
CT CERVICAL SPINE WITHOUT CONTRAST
TECHNIQUE: Multidetector CT imaging of the cervical spine was performed without
intravenous contrast. Multiplanar CT image reconstructions were also
generated.

[Series 5: sagittal bone · sagittal · 0.22mm/px · 5 of 61 slices shown, 6 images]
[im 21/61  bone]
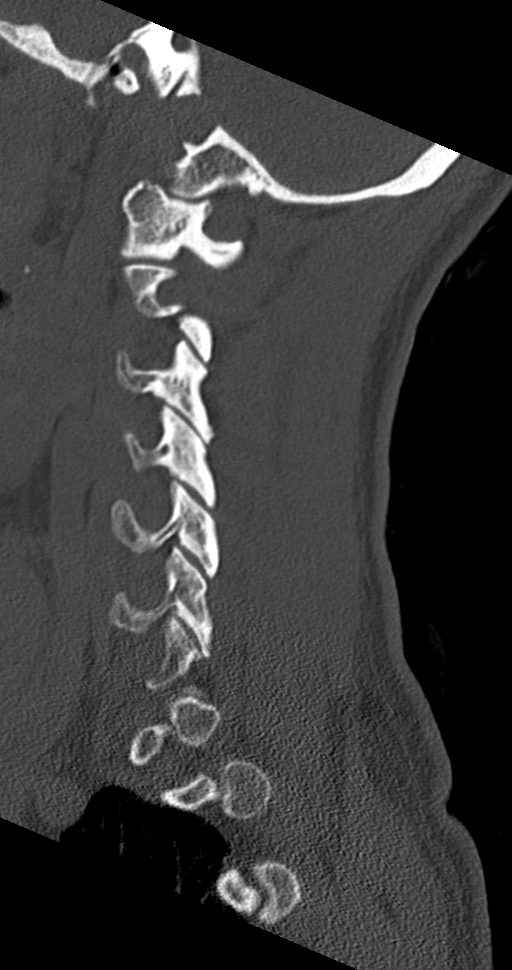
[im 26/61  bone]
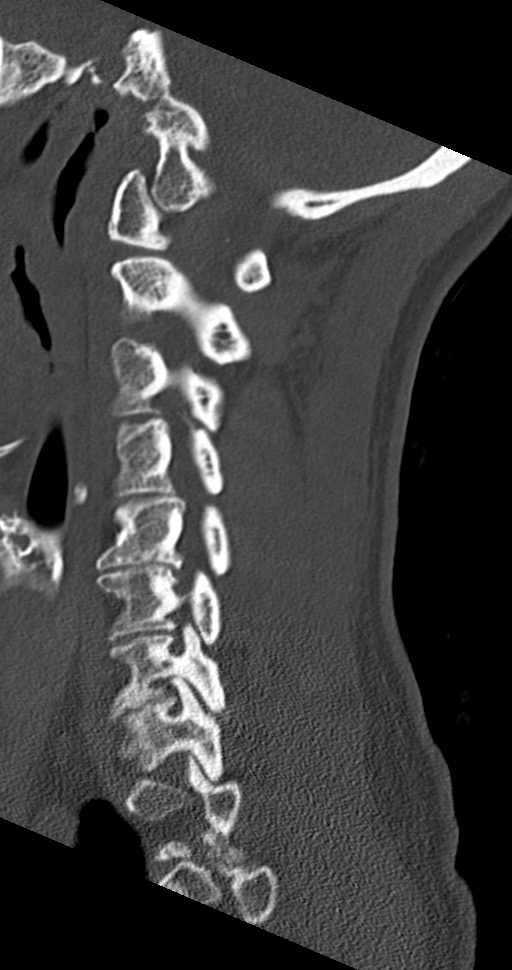
[im 31/61  soft-tissue]
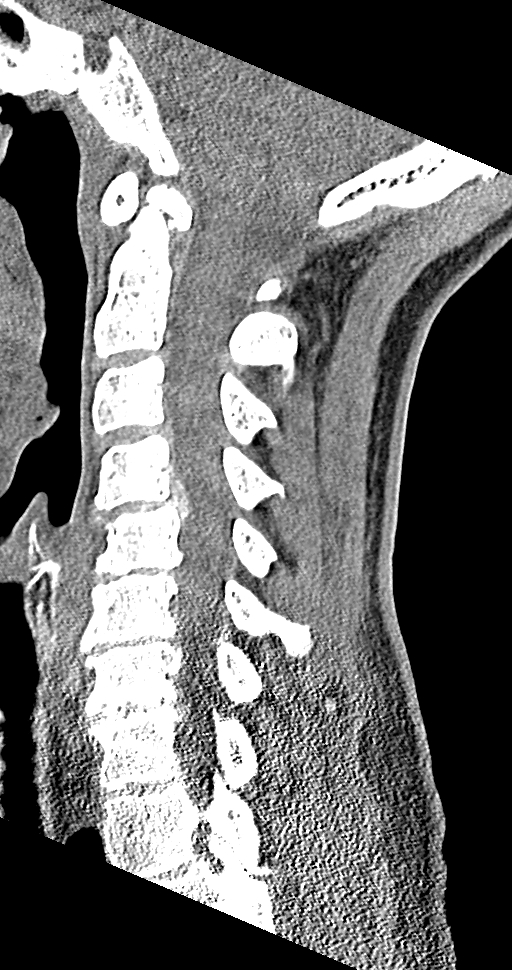
[im 31/61  bone]
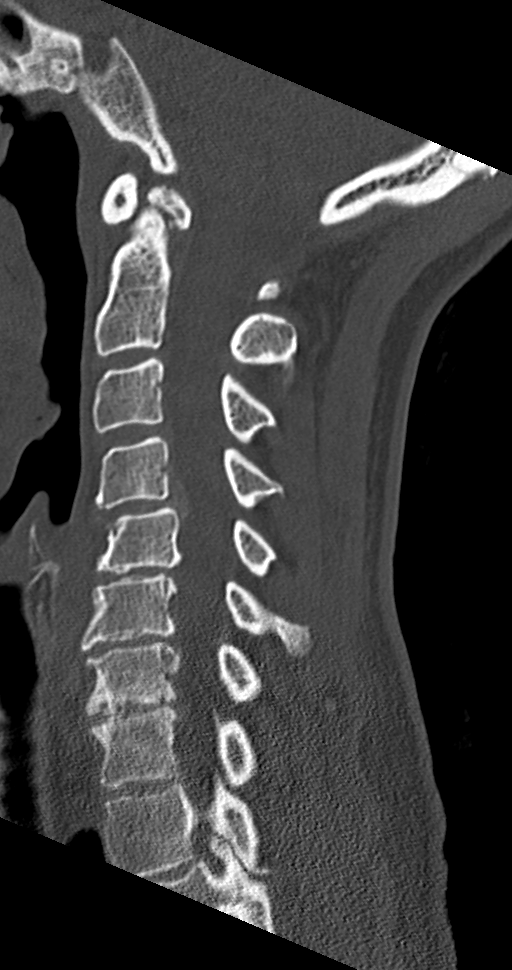
[im 36/61  bone]
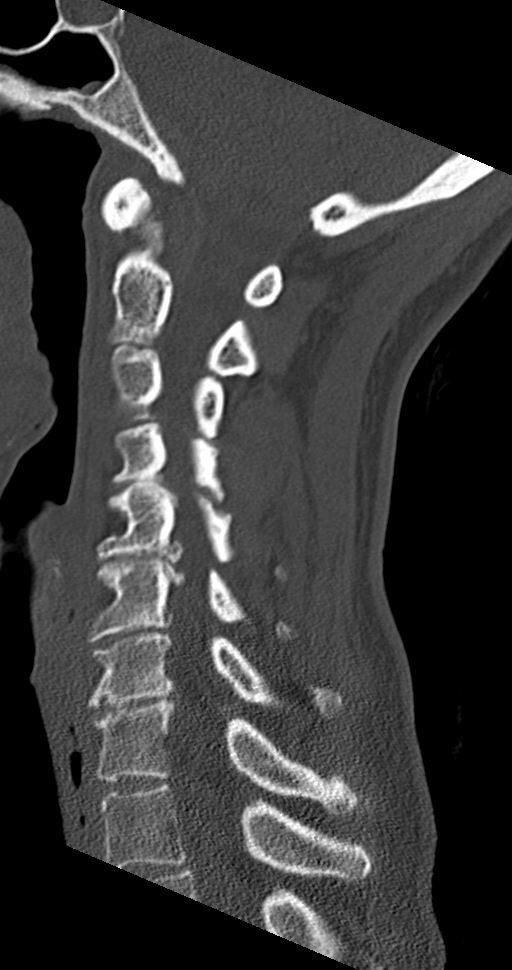
[im 41/61  bone]
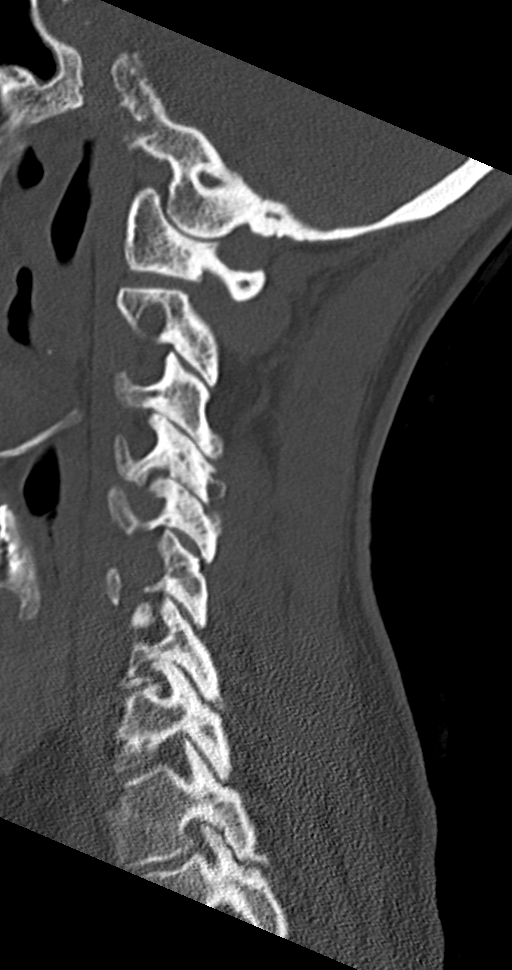

[Series 6: coronal bone · coronal · 0.25mm/px · 3 of 47 slices shown]
[im 10/47  bone]
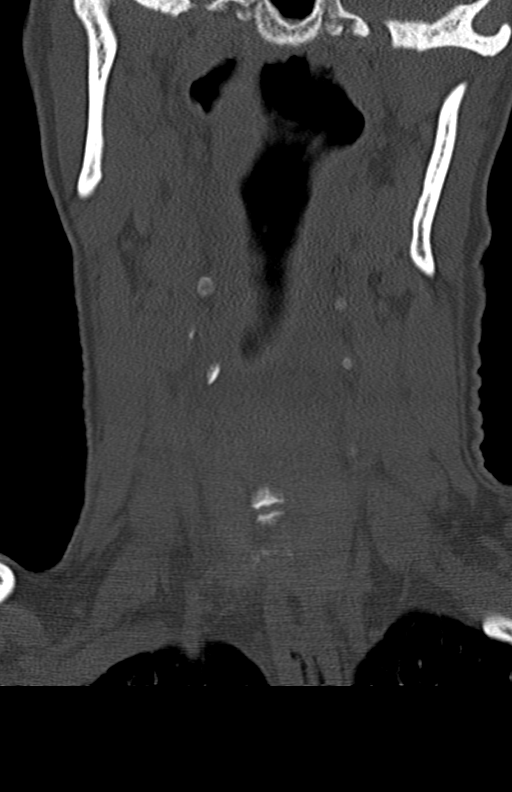
[im 19/47  bone]
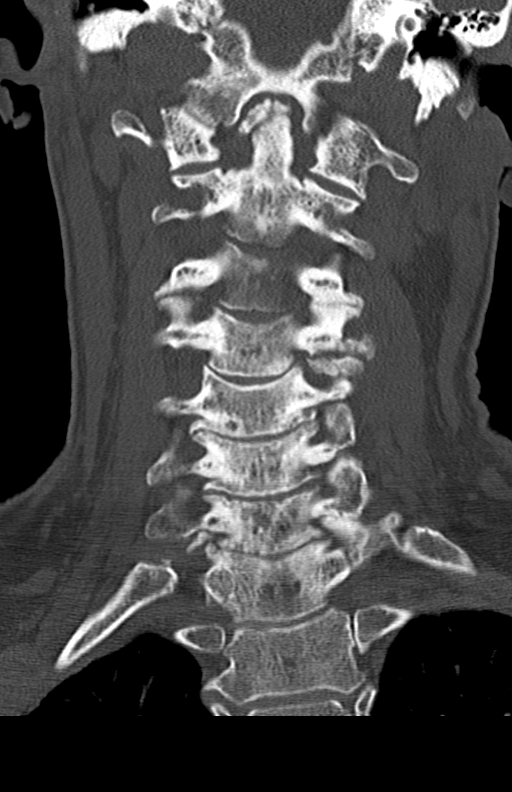
[im 28/47  bone]
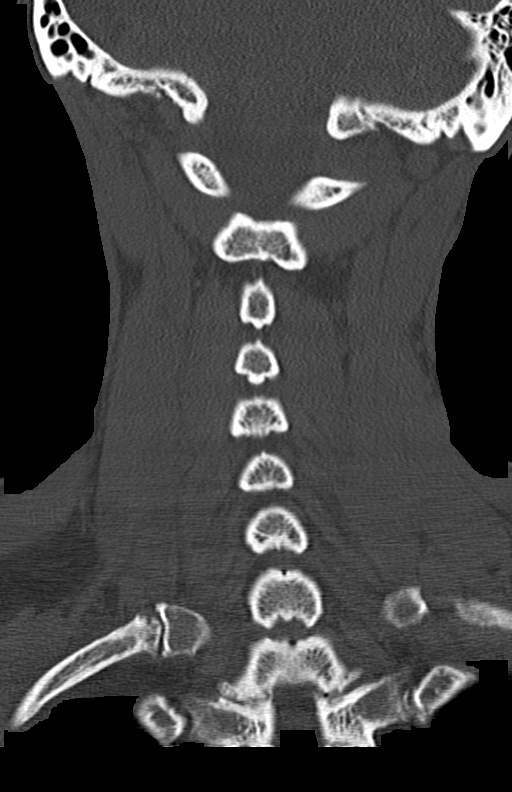

[Series 9: orthogonal bone · axial · 0.21mm/px · z∈[+1512,+1577]mm · 2 of 87 slices shown, 3 images]
[im 29/87  soft-tissue]
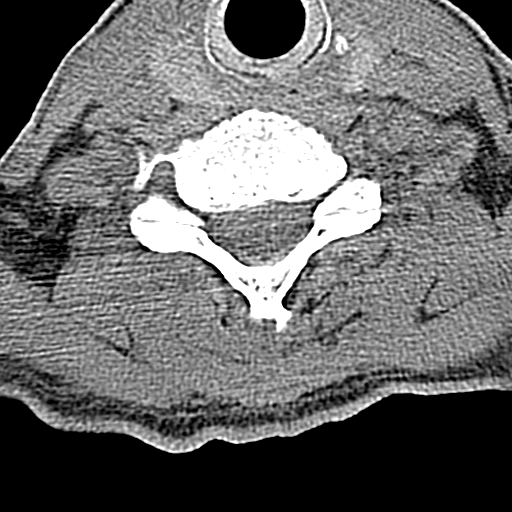
[im 29/87  bone]
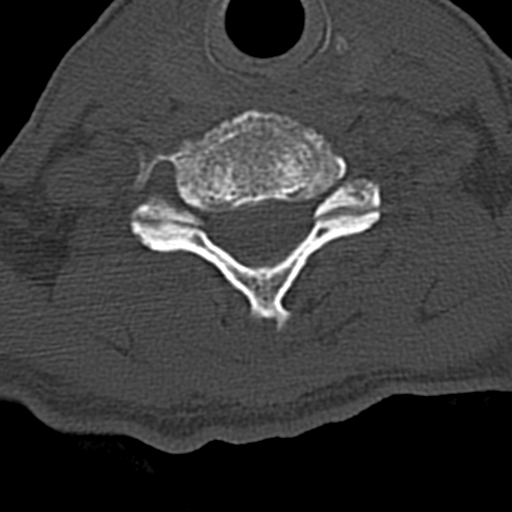
[im 58/87  bone]
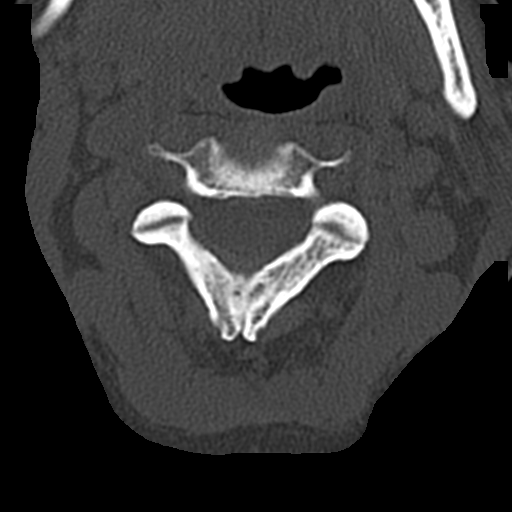

[10 of 33 positions shown; findings below may reference images not displayed]

FINDINGS: Alignment: Straightening of the normal cervical lordosis. Mild
scoliotic curvature convex to the right.

Skull base and vertebrae: No primary bone pathology. Os odontoideum
at the apex of the dens with mild degenerative change.

Soft tissues and spinal canal: Negative

Disc levels: Foramen magnum is sufficiently patent. Degenerative
changes at the C1-2 articulation.

C2-3: Normal

C3-4: Facet osteoarthritis on the left. Bilateral uncovertebral
osteophytes. Mild bilateral foraminal narrowing.

C4-5: Facet osteoarthritis on the left. Shallow protrusion of the
disc. Bilateral uncovertebral prominence. Bilateral foraminal
narrowing left worse than right could affect the C5 nerves.

C5-6: Spondylosis with endplate osteophytes and bulging of the disc.
Bilateral foraminal stenosis could compress either C6 nerve.

C6-7: Spondylosis with endplate osteophytes. Bilateral foraminal
stenosis could affect either C7 nerve.

C7-T1: Spondylosis with bilateral osteophytes. Bilateral foraminal
stenosis could compress either C8 nerve.

T1-2: Mild non-compressive degenerative changes. T2-3 shows
bilateral facet osteoarthritis.

Upper chest: Negative

Other: None
IMPRESSION: C3-4: Bilateral foraminal narrowing because of uncovertebral
osteophytes and left-sided facet degeneration. Some potential to
affect either C4 nerve.

C4-5: Bilateral uncovertebral osteophytes, shallow broad-based disc
herniation and left-sided facet arthropathy. Foraminal stenosis
could affect either C5 nerve.

C5-6: Bilateral uncovertebral osteophytes cause foraminal narrowing
that could affect either C6 nerve.

C6-7: Bilateral uncovertebral osteophytes cause foraminal narrowing
that could affect either C7 nerve.

C7-T1: Bilateral uncovertebral osteophytes cause foraminal narrowing
that could affect either C8 nerve.

## 2020-01-13 ENCOUNTER — Emergency Department (HOSPITAL_COMMUNITY)
Admission: EM | Admit: 2020-01-13 | Discharge: 2020-01-14 | Disposition: A | Discharge status: Home / Self Care | Payer: Medicare Other | Attending: Emergency Medicine | Admitting: Emergency Medicine

## 2020-01-13 DIAGNOSIS — F4323 Adjustment disorder with mixed anxiety and depressed mood: Secondary | ICD-10-CM | POA: Insufficient documentation

## 2020-01-13 DIAGNOSIS — Z87891 Personal history of nicotine dependence: Secondary | ICD-10-CM | POA: Insufficient documentation

## 2020-01-13 DIAGNOSIS — T40422A Poisoning by tramadol, intentional self-harm, initial encounter: Secondary | ICD-10-CM | POA: Diagnosis not present

## 2020-01-13 DIAGNOSIS — T50902A Poisoning by unspecified drugs, medicaments and biological substances, intentional self-harm, initial encounter: Secondary | ICD-10-CM

## 2020-01-13 DIAGNOSIS — Z20822 Contact with and (suspected) exposure to covid-19: Secondary | ICD-10-CM | POA: Insufficient documentation

## 2020-01-13 LAB — COMPREHENSIVE METABOLIC PANEL
ALT: 19 U/L (ref 0–44)
AST: 22 U/L (ref 15–41)
Albumin: 3.5 g/dL (ref 3.5–5.0)
Alkaline Phosphatase: 76 U/L (ref 38–126)
Anion gap: 10 (ref 5–15)
BUN: 19 mg/dL (ref 8–23)
CO2: 22 mmol/L (ref 22–32)
Calcium: 9.3 mg/dL (ref 8.9–10.3)
Chloride: 106 mmol/L (ref 98–111)
Creatinine, Ser: 0.8 mg/dL (ref 0.44–1.00)
GFR calc Af Amer: >60 mL/min (ref >60)
GFR calc non Af Amer: >60 mL/min (ref >60)
Glucose, Bld: 109 mg/dL — ABNORMAL HIGH (ref 70–99)
Potassium: 3.9 mmol/L (ref 3.5–5.1)
Sodium: 138 mmol/L (ref 135–145)
Total Bilirubin: 0.5 mg/dL (ref 0.3–1.2)
Total Protein: 7.1 g/dL (ref 6.5–8.1)

## 2020-01-13 LAB — MAGNESIUM: Magnesium: 1.9 mg/dL (ref 1.7–2.4)

## 2020-01-13 LAB — RAPID URINE DRUG SCREEN, HOSP PERFORMED
Amphetamines: NOT DETECTED
Barbiturates: NOT DETECTED
Benzodiazepines: NOT DETECTED
Cocaine: NOT DETECTED
Opiates: NOT DETECTED
Tetrahydrocannabinol: POSITIVE — AB

## 2020-01-13 LAB — ACETAMINOPHEN LEVEL
Acetaminophen (Tylenol), Serum: <10 ug/mL — ABNORMAL LOW (ref 10–30)
Acetaminophen (Tylenol), Serum: <10 ug/mL — ABNORMAL LOW (ref 10–30)

## 2020-01-13 LAB — ETHANOL: Alcohol, Ethyl (B): <10 mg/dL (ref <10)

## 2020-01-13 LAB — SALICYLATE LEVEL: Salicylate Lvl: <7 mg/dL — ABNORMAL LOW (ref 7.0–30.0)

## 2020-01-13 MED ORDER — CLONAZEPAM 0.5 MG PO TABS
1.0000 mg | ORAL_TABLET | Freq: Three times a day (TID) | ORAL | Status: DC | PRN
  Administered 2020-01-13 – 2020-01-14 (×2): 1 mg via ORAL
  Filled 2020-01-13 (×3): qty 2

## 2020-01-13 MED ORDER — ACETAMINOPHEN 325 MG PO TABS
650.0000 mg | ORAL_TABLET | Freq: Four times a day (QID) | ORAL | Status: DC | PRN
  Administered 2020-01-14: 650 mg via ORAL
  Filled 2020-01-13 (×2): qty 2

## 2020-01-13 NOTE — ED Provider Notes (Signed)
Emergency Department Provider Note   I have reviewed the triage vital signs and the nursing notes.   HISTORY  Chief Complaint Drug Overdose   HPI Allison Dickerson is a 63 y.o. female with past medical history reviewed below presents to the emergency department after intentional overdose this morning.  Patient reportedly took 12 tramadol tablets in an attempt to harm herself.  The medication bottle and tablets were not found on scene the patient was found to have Flexeril and Klonopin with pills left in the bottle.  The time of overdose is unclear.  EMS was called by the patient's husband.  Patient denies any vomiting after taking the medication.  She denies drinking alcohol or using drugs.  She denies taking other pills.  She does state that this was an attempt to harm herself.   Past Medical History:  Diagnosis Date  . Anxiety   . Cervical cancer ([place])    When younger.  . Diverticulitis   . Pinched nerve    right hand    Patient Active Problem List   Diagnosis Date Noted  . Atypical chest pain 02/18/2017  . Anxiety 02/18/2017    Past Surgical History:  Procedure Laterality Date  . ABDOMINAL HYSTERECTOMY    . cervical cancer removal      Allergies Gluten meal  Family History  Problem Relation Age of Onset  . Cancer Father   . Cancer Other     Social History Social History   Tobacco Use  . Smoking status: Former Smoker    Packs/day: 0.50    Years: 20.00    Pack years: 10.00    Types: Cigarettes  . Smokeless tobacco: Never Used  Substance Use Topics  . Alcohol use: No  . Drug use: Yes    Types: Marijuana    Comment: occ    Review of Systems  Constitutional: No fever/chills Eyes: No visual changes. ENT: No sore throat. Cardiovascular: Denies chest pain. Respiratory: Denies shortness of breath. Gastrointestinal: No abdominal pain.  No nausea, no vomiting.  No diarrhea.  No constipation. Genitourinary: Negative for dysuria. Musculoskeletal:  Negative for back pain. Skin: Negative for rash. Neurological: Negative for headaches, focal weakness or numbness. Psychiatric: Positive SI with suicide attempt today.   10-point ROS otherwise negative.  ____________________________________________   PHYSICAL EXAM:  VITAL SIGNS: ED Triage Vitals  Enc Vitals Group     BP 01/13/20 0952 (!) 131/52     Pulse Rate 01/13/20 0952 76     Resp 01/13/20 0952 12     Temp 01/13/20 0952 97.9 F (36.6 C)     Temp Source 01/13/20 0952 Oral     SpO2 01/13/20 0952 100 %     Weight 01/13/20 0953 110 lb (49.9 kg)     Height 01/13/20 0953 5\' 7"  (1.702 m)    Constitutional: Drowsy but awakens to voice and answers questions/follows commands.  Eyes: Conjunctivae are normal. 66mm and reactive bilaterally.  Head: Atraumatic. Nose: No congestion/rhinnorhea. Mouth/Throat: Mucous membranes are moist.  Neck: No stridor.  Cardiovascular: Normal rate, regular rhythm. Good peripheral circulation. Grossly normal heart sounds.   Respiratory: Normal respiratory effort.  No retractions. Lungs CTAB. Gastrointestinal: Soft and nontender. No distention.  Musculoskeletal: No lower extremity tenderness nor edema. No gross deformities of extremities. Neurologic:  Normal speech and language. No gross focal neurologic deficits are appreciated.  Skin:  Skin is warm, dry and intact. No rash noted.  ____________________________________________   LABS (all labs ordered are listed,  but only abnormal results are displayed)  Labs Reviewed  ACETAMINOPHEN LEVEL - Abnormal; Notable for the following components:      Result Value   Acetaminophen (Tylenol), Serum <10 (*)    All other components within normal limits  COMPREHENSIVE METABOLIC PANEL - Abnormal; Notable for the following components:   Glucose, Bld 109 (*)    All other components within normal limits  SALICYLATE LEVEL - Abnormal; Notable for the following components:   Salicylate Lvl <1.7 (*)    All other  components within normal limits  RAPID URINE DRUG SCREEN, HOSP PERFORMED - Abnormal; Notable for the following components:   Tetrahydrocannabinol POSITIVE (*)    All other components within normal limits  ACETAMINOPHEN LEVEL - Abnormal; Notable for the following components:   Acetaminophen (Tylenol), Serum <10 (*)    All other components within normal limits  SARS CORONAVIRUS 2 BY RT PCR (HOSPITAL ORDER, [place] [facility])  ETHANOL  MAGNESIUM   ____________________________________________  EKG   EKG Interpretation  Date/Time:  Thursday January 13 2020 09:50:56 EDT Ventricular Rate:  77 PR Interval:    QRS Duration: 94 QT Interval:  390 QTC Calculation: 442 R Axis:   72 Text Interpretation: Sinus rhythm No STEMI Confirmed by Nanda Quinton [id number]) on 01/13/2020 10:09:04 AM       ____________________________________________  RADIOLOGY  None  ____________________________________________   PROCEDURES  Procedure(s) performed:   Procedures  None  ____________________________________________   INITIAL IMPRESSION / ASSESSMENT AND PLAN / ED COURSE  Pertinent labs & imaging results that were available during my care of the patient were reviewed by me and considered in my medical decision making (see chart for details).   Patient presents to the emergency department after intentional tramadol overdose at some point this morning.  This was a suicide attempt according to patient. Patient is on monitor. EKG reassuring.  Mental status is depressed but patient awakens with voice and follows commands briskly.  She is managing secretions and maintaining her own airway at this time.  Patient's nurse spoke with [facility] poison control who gave recommendations on lab work.  They recommend 8 hours of ED observation and monitoring for seizure activity.  Will follow labs and ultimately have TTS evaluate once medically clear.  Second tylenol level and obs time pending.  Labs to this point reviewed and unremarkable. Care transferred to Dr. Kathrynn Humble.  ____________________________________________  FINAL CLINICAL IMPRESSION(S) / ED DIAGNOSES  Final diagnoses:  Intentional drug overdose, initial encounter [facility])    MEDICATIONS GIVEN DURING THIS VISIT:  Medications  clonazePAM (KLONOPIN) tablet 1 mg (1 mg Oral Given 01/13/20 1519)  acetaminophen (TYLENOL) tablet 650 mg (has no administration in time range)    Note:  This document was prepared using Dragon voice recognition software and may include unintentional dictation errors.  Nanda Quinton, MD, [facility] Emergency Medicine    Soleil Mas, Wonda Olds, MD 01/14/20 [phone]

## 2020-01-13 NOTE — ED Notes (Signed)
Have called poison control.   Tylenol Asp Ethanol CMP Magnesium Level   Can cause seizures. Increased risk of seizures. Give Narcan cautiously as it can precipitate seizures. Give Benzos for seizures. Watch 8 hours from time she came in. Fluids as needed.

## 2020-01-13 NOTE — BH Assessment (Signed)
Comprehensive Clinical Assessment (CCA) Note  01/13/2020 Allison Dickerson [id number]  Per EDP note, "Allison Dickerson is a 64 y.o. female with past medical history reviewed below presents to the emergency department after intentional overdose this morning.  Patient reportedly took 12 tramadol tablets in an attempt to harm herself.  The medication bottle and tablets were not found on scene the patient was found to have Flexeril and Klonopin with pills left in the bottle.  The time of overdose is unclear.  EMS was called by the patient's husband.  Patient denies any vomiting after taking the medication.  She denies drinking alcohol or using drugs.  She denies taking other pills.  She does state that this was an attempt to harm herself".   During TTS assessment pt presented tearful and a bit anxious. Pt states that she indeed did not try to overdose on pills, states she simply took too many pills and that this was not her and out of character. Pt reports she has "not been myself since I started taking the Tramadol". Pt reports she has felt less "empathetic" since taking this medication and does not want to continue on this medication, but states it does help her sleep at night. Pt currently denies SI, HI, AVH and SIB, reports no psychiatric history but has a PCP who prescribes her Klonopin and she took Flexeril in the past. Pt reports no history of trauma/abuse or family mental health/substance abuse concerns. Pt reports getting 8 to 10 hours of sleep daily with a fair appetite and denies drug use, current UDS positive for cannibus. Pt reports no current charges,no history of violence currently. Pt reports she no longer wants to take Tramadol and wants a new PCP feels he is not helping her as much and she does not want to seek treatment but open to still taking Klonopin.    Collateral Information:  Pt spoke with husband Zeniah Briney, he states he found wife unresponsive on the bed and he called EMS. He  states since wife's mother has been living with them October 2020 and wife has been stressed since then. Pts mother is 45 years old and has dementia and they are her primary caretakers at this time. Husband feels that wife could benefit with further treatment, states this situation should not be taken lightly, pt has no history of SI, HI.    Diagnosis: Adjustment disorder, With mixed anxiety and depressed mood   Disposition: Talbot Grumbling, FNP recommends pt for inpatient treatment. Social Work to seek placement for patient.  Visit Diagnosis:   Intentional Overdose  CCA Screening, Triage and Referral (STR)  Patient Reported Information How did you hear about [place]? Other (Comment) (EMS)  Referral name: EMS Referral phone number: No data recorded  Whom do you see for routine medical problems? Primary Care  Practice/Facility Name: Dr Hinda Len[facility] (Dr [facility])  Practice/Facility Phone Number: No data recorded Name of Contact: No data recorded Contact Number: No data recorded Contact Fax Number: No data recorded Prescriber Name: No data recorded Prescriber Address (if known): No data recorded  What Is the Reason for Your Visit/Call Today? No data recorded How Long Has This Been Causing You Problems? <Week  What Do You Feel Would Help You the Most Today? Assessment Only   Have You Recently Been in Any Inpatient Treatment (Hospital/Detox/Crisis Center/28-Day Program)? No  Name/Location of Program/Hospital:No data recorded How Long Were You There? No data recorded When Were You Discharged? No data recorded  Have You Ever Received Services From [employer] Before? No  Who Do You See at [facility]? No data recorded  Have You Recently Had Any Thoughts About Hurting Yourself? No  Are You Planning to Commit Suicide/Harm Yourself At This time? No   Have you Recently Had Thoughts About [place]? No  Explanation: No data recorded  Have  You Used Any Alcohol or Drugs in the Past 24 Hours? No  How Long Ago Did You Use Drugs or Alcohol? No data recorded What Did You Use and How Much? No data recorded  Do You Currently Have a Therapist/Psychiatrist? No  Name of Therapist/Psychiatrist: No data recorded  Have You Been Recently Discharged From Any Office Practice or Programs? No  Explanation of Discharge From Practice/Program: No data recorded    CCA Screening Triage Referral Assessment Type of Contact: Tele-Assessment  Is this Initial or Reassessment? Initial Assessment  Date Telepsych consult ordered in [facility]:  01/13/20  Time Telepsych consult ordered in [facility]:  2051   Patient Reported Information Reviewed? Yes  Patient Left Without Being Seen? No data recorded Reason for Not Completing Assessment: No data recorded  Collateral Involvement: No data recorded  Does Patient Have a [place]? No data recorded Name and Contact of Legal Guardian: No data recorded If Minor and Not Living with Parent(s), Who has Custody? No data recorded Is CPS involved or ever been involved? Never  Is APS involved or ever been involved? Never   Patient Determined To Be At Risk for Harm To Self or Others Based on Review of Patient Reported Information or Presenting Complaint? Yes, for Self-Harm  Method: Intentional Overdose Availability of Means: Yes access to pils Intent: Yes Notification Required: EMS Additional Information for Danger to Others Potential: None Additional Comments for Danger to Others Potential: No data recorded Are There Guns or Other Weapons in Your Home? NO Types of Guns/Weapons: None Are These Weapons Safely Secured?                            No data recorded Who Could Verify You Are Able To Have These Secured: No data recorded Do You Have any Outstanding Charges, Pending Court Dates, Parole/Probation? NO Contacted To Inform of Risk of Harm To Self or Others: Family/Significant  Other:   Location of Assessment: [facility]   Does Patient Present under Involuntary Commitment? No  IVC Papers Initial File Date: No data recorded  [place] of Residence: [place]   Patient Currently Receiving the Following Services: Not Receiving Services   Determination of Need: Emergent (2 hours)   Options For Referral: Inpatient Treatment    CCA Biopsychosocial  Intake/Chief Complaint:  CCA Intake With Chief Complaint CCA Part Two Date: 01/13/20 CCA Part Two Time: 2101 Chief Complaint/Presenting Problem:  (overdose on medication) Patient's Currently Reported Symptoms/Problems:  (worry, stress, and arthriitis) Individual's Strengths:  (UTA) Individual's Preferences:  (UTA) Individual's Abilities:  (UTA) Type of Services Patient Feels Are Needed:  (UTA) Initial Clinical Notes/Concerns:  (UTA)  Mental Health Symptoms Depression:  Depression: Sleep (too much or little), Fatigue, Change in energy/activity, Duration of symptoms greater than two weeks  Mania:  Mania: None  Anxiety:   Anxiety: Worrying, Fatigue, Sleep, Tension  Psychosis:  Psychosis: None  Trauma:  Trauma: None  Obsessions:  Obsessions: None  Compulsions:  Compulsions: None  Inattention:  Inattention: None  Hyperactivity/Impulsivity:  Hyperactivity/Impulsivity: N/A  Oppositional/Defiant Behaviors:  Oppositional/Defiant Behaviors: None  Emotional Irregularity:  Emotional Irregularity: None  Other Mood/Personality Symptoms:      Mental Status Exam Appearance and self-care  Stature:  Stature: Small  Weight:  Weight: Thin  Clothing:   Casual  Grooming:  Grooming: Normal  Cosmetic use:  Cosmetic Use: Age appropriate  Posture/gait:  Posture/Gait: Normal  Motor activity:  Motor Activity:  (Normal)  Sensorium  Attention:  Attention: Normal  Concentration:  Concentration: Normal  Orientation:  Orientation: Time, Situation, Place, Person  Recall/memory:  Recall/Memory: Normal  Affect and Mood  Affect:   Affect: Anxious, Tearful  Mood:  Mood: Anxious  Relating  Eye contact:  Eye Contact: Normal  Facial expression:  Facial Expression: Tense  Attitude toward examiner:  Attitude Toward Examiner: Cooperative  Thought and Language  Speech flow: Speech Flow: Clear and Coherent  Thought content:  Thought Content: Appropriate to Mood and Circumstances  Preoccupation:  Preoccupations: Religion  Hallucinations:  Hallucinations: None  Organization:     Transport planner of Knowledge:  Fund of Knowledge: Good  Intelligence:  Intelligence: Average  Abstraction:  Abstraction: Normal  Judgement:  Judgement: Good  Reality Testing:  Reality Testing: Adequate  Insight:  Insight: Good  Decision Making:  Decision Making: Normal  Social Functioning  Social Maturity:  Social Maturity: Responsible  Social Judgement:  Social Judgement: Normal  Stress  Stressors:  Stressors: Other (Comment)  Coping Ability:  Coping Ability: Overwhelmed  Skill Deficits:  Skill Deficits: None  Supports:  Supports: Friends/Service system, Family, Church     Religion: Religion/Spirituality Are You A Religious Person?: Yes What is Your Religious Affiliation?: Baptist How Might This Affect Treatment?:  (Does not beleive in taking medications)  Leisure/Recreation: Leisure / Recreation Do You Have Hobbies?: No  Exercise/Diet: Exercise/Diet Do You Exercise?: Yes What Type of Exercise Do You Do?: Other (Comment), Run/Walk How Many Times a Week Do You Exercise?: 1-3 times a week Have You Gained or Lost A Significant Amount of Weight in the Past Six Months?: Yes-Lost Number of Pounds Lost?: 20 Do You Follow a Special Diet?: No Do You Have Any Trouble Sleeping?: No   CCA Employment/Education  Employment/Work Situation: Employment / Work Copywriter, advertising Employment situation: Retired Archivist job has been impacted by current illness: No Has patient ever been in the [employer]?: No  Education: Education Is  Patient Currently Attending School?: No Last Grade Completed: 12 Name of High School: [employer] Did Teacher, adult education From [employer]?: Yes Did Physicist, medical?: Yes What Type of College Degree Do you Have?:  (Unknown) Did [place]?: No Did You Have An Individualized Education Program (IIEP): No Did You Have Any Difficulty At School?: No Patient's Education Has Been Impacted by Current Illness: No   CCA Family/Childhood History  Family and Relationship History: Family history Marital status: Married Number of Years Married: 59 Are you sexually active?: Yes What is your sexual orientation?:  (Straight) Does patient have children?: Yes How many children?: 1 How is patient's relationship with their children?:  (good relationship)  Childhood History:  Childhood History By whom was/is the patient raised?: Both parents Description of patient's relationship with caregiver when they were a child:  (NA) Patient's description of current relationship with people who raised him/her:  (NA) How were you disciplined when you got in trouble as a child/adolescent?:  (NA) Does patient have siblings?: Yes Number of Siblings: 2 Description of patient's current relationship with siblings:  (fair appetite) Did patient suffer any verbal/emotional/physical/sexual abuse as  a child?: No Did patient suffer from severe childhood neglect?: No Has patient ever been sexually abused/assaulted/raped as an adolescent or adult?: No Was the patient ever a victim of a crime or a disaster?: No Witnessed domestic violence?: No Has patient been affected by domestic violence as an adult?: No  Child/Adolescent Assessment:     CCA Substance Use  Alcohol/Drug Use: Alcohol / Drug Use History of alcohol / drug use?: No history of alcohol / drug abuse (Pt denies)       Recommendations for Services/Supports/Treatments: Other    DSM5 Diagnoses: Patient Active Problem  List   Diagnosis Date Noted  . Atypical chest pain 02/18/2017  . Anxiety 02/18/2017      Donato Heinz, LCSWA

## 2020-01-13 NOTE — ED Notes (Signed)
Have counted all of patient's Clonazepam and Flexeril. Have notified pharmacist to come and pick them up.

## 2020-01-13 NOTE — ED Provider Notes (Signed)
  Physical Exam  BP 122/72   Pulse 73   Temp 97.9 F (36.6 C) (Oral)   Resp 14   Ht 5\' 7"  (1.702 m)   Wt 49.9 kg   SpO2 100%   BMI 17.23 kg/m   Physical Exam  ED Course/Procedures     Procedures  MDM   Assuming care of patient from Dr. Laverta Baltimore.   Patient in the ED for intentional OD. Workup thus far shows reassuring tox workup.  Concerning findings are as following:  Important pending results are repeat tylenol leavel.  According to Dr. Laverta Baltimore, plan is to f/u on psych eval. Pt will need 2 more hours in the ER for monitoring for Seizures/   Patient had no complains, no concerns from the nursing side. Will continue to monitor.      Varney Biles, MD 01/13/20 1520

## 2020-01-13 NOTE — ED Triage Notes (Addendum)
Pt took 12 Tramadol to intentionally overdose. EMS could not find Tramadol, but they did find Flexeril and Klonopin. Unsure of time. Husband called EMS.

## 2020-01-14 ENCOUNTER — Encounter: Payer: Self-pay | Admitting: Psychiatry

## 2020-01-14 ENCOUNTER — Other Ambulatory Visit: Payer: Self-pay

## 2020-01-14 ENCOUNTER — Inpatient Hospital Stay
Admission: AD | Admit: 2020-01-14 | Discharge: 2020-01-16 | DRG: 881 | Disposition: A | Discharge status: Home / Self Care | Payer: Medicaid Other | Source: Intra-hospital | Attending: Psychiatry | Admitting: Psychiatry

## 2020-01-14 DIAGNOSIS — Z91018 Allergy to other foods: Secondary | ICD-10-CM

## 2020-01-14 DIAGNOSIS — Z809 Family history of malignant neoplasm, unspecified: Secondary | ICD-10-CM

## 2020-01-14 DIAGNOSIS — F329 Major depressive disorder, single episode, unspecified: Principal | ICD-10-CM | POA: Diagnosis present

## 2020-01-14 DIAGNOSIS — M1990 Unspecified osteoarthritis, unspecified site: Secondary | ICD-10-CM | POA: Diagnosis present

## 2020-01-14 DIAGNOSIS — K589 Irritable bowel syndrome without diarrhea: Secondary | ICD-10-CM | POA: Diagnosis present

## 2020-01-14 DIAGNOSIS — Z79899 Other long term (current) drug therapy: Secondary | ICD-10-CM | POA: Diagnosis not present

## 2020-01-14 DIAGNOSIS — Z79891 Long term (current) use of opiate analgesic: Secondary | ICD-10-CM

## 2020-01-14 DIAGNOSIS — Z915 Personal history of self-harm: Secondary | ICD-10-CM | POA: Diagnosis not present

## 2020-01-14 DIAGNOSIS — Z87891 Personal history of nicotine dependence: Secondary | ICD-10-CM | POA: Diagnosis not present

## 2020-01-14 DIAGNOSIS — Z818 Family history of other mental and behavioral disorders: Secondary | ICD-10-CM | POA: Diagnosis not present

## 2020-01-14 DIAGNOSIS — Z90710 Acquired absence of both cervix and uterus: Secondary | ICD-10-CM | POA: Diagnosis not present

## 2020-01-14 DIAGNOSIS — F411 Generalized anxiety disorder: Secondary | ICD-10-CM | POA: Diagnosis present

## 2020-01-14 DIAGNOSIS — J45909 Unspecified asthma, uncomplicated: Secondary | ICD-10-CM | POA: Diagnosis present

## 2020-01-14 DIAGNOSIS — Z8541 Personal history of malignant neoplasm of cervix uteri: Secondary | ICD-10-CM | POA: Diagnosis not present

## 2020-01-14 DIAGNOSIS — Z20822 Contact with and (suspected) exposure to covid-19: Secondary | ICD-10-CM | POA: Diagnosis present

## 2020-01-14 DIAGNOSIS — F4323 Adjustment disorder with mixed anxiety and depressed mood: Secondary | ICD-10-CM | POA: Diagnosis not present

## 2020-01-14 DIAGNOSIS — T40422A Poisoning by tramadol, intentional self-harm, initial encounter: Secondary | ICD-10-CM | POA: Diagnosis not present

## 2020-01-14 LAB — SARS CORONAVIRUS 2 BY RT PCR (HOSPITAL ORDER, PERFORMED IN ~~LOC~~ HOSPITAL LAB): SARS Coronavirus 2: NEGATIVE

## 2020-01-14 MED ORDER — NAPROXEN 500 MG PO TABS
250.0000 mg | ORAL_TABLET | Freq: Three times a day (TID) | ORAL | Status: DC
  Administered 2020-01-14 – 2020-01-16 (×6): 250 mg via ORAL
  Filled 2020-01-14 (×6): qty 1

## 2020-01-14 MED ORDER — ONDANSETRON 4 MG PO TBDP: 4.0000 mg | ORAL_TABLET | Freq: Four times a day (QID) | ORAL | Status: DC | PRN

## 2020-01-14 MED ORDER — ALUM & MAG HYDROXIDE-SIMETH 200-200-20 MG/5ML PO SUSP: 30.0000 mL | ORAL | Status: DC | PRN

## 2020-01-14 MED ORDER — MENTHOL 3 MG MT LOZG
1.0000 | LOZENGE | OROMUCOSAL | Status: DC | PRN
  Filled 2020-01-14: qty 9

## 2020-01-14 MED ORDER — ADULT MULTIVITAMIN W/MINERALS CH
1.0000 | ORAL_TABLET | Freq: Every day | ORAL | Status: DC
  Administered 2020-01-15 – 2020-01-16 (×2): 1 via ORAL
  Filled 2020-01-14 (×2): qty 1

## 2020-01-14 MED ORDER — ACETAMINOPHEN 325 MG PO TABS: 650.0000 mg | ORAL_TABLET | Freq: Four times a day (QID) | ORAL | Status: DC | PRN

## 2020-01-14 MED ORDER — CLONAZEPAM 1 MG PO TABS
1.0000 mg | ORAL_TABLET | Freq: Three times a day (TID) | ORAL | Status: DC
  Administered 2020-01-14 – 2020-01-16 (×6): 1 mg via ORAL
  Filled 2020-01-14 (×6): qty 1

## 2020-01-14 MED ORDER — CALCIUM CARBONATE-VITAMIN D 500-200 MG-UNIT PO TABS
1.0000 | ORAL_TABLET | Freq: Every day | ORAL | Status: DC
  Administered 2020-01-15 – 2020-01-16 (×2): 1 via ORAL
  Filled 2020-01-14 (×2): qty 1

## 2020-01-14 MED ORDER — HYDROXYZINE HCL 25 MG PO TABS: 25.0000 mg | ORAL_TABLET | Freq: Three times a day (TID) | ORAL | Status: DC | PRN

## 2020-01-14 MED ORDER — DICYCLOMINE HCL 20 MG PO TABS
20.0000 mg | ORAL_TABLET | Freq: Four times a day (QID) | ORAL | Status: DC | PRN
  Administered 2020-01-16: 20 mg via ORAL
  Filled 2020-01-14: qty 1

## 2020-01-14 MED ORDER — HYDROXYZINE HCL 25 MG PO TABS: 25.0000 mg | ORAL_TABLET | Freq: Four times a day (QID) | ORAL | Status: DC | PRN

## 2020-01-14 MED ORDER — LOPERAMIDE HCL 2 MG PO CAPS: 2.0000 mg | ORAL_CAPSULE | ORAL | Status: DC | PRN

## 2020-01-14 MED ORDER — ENSURE ENLIVE PO LIQD
237.0000 mL | Freq: Two times a day (BID) | ORAL | Status: DC
  Administered 2020-01-15 – 2020-01-16 (×3): 237 mL via ORAL

## 2020-01-14 MED ORDER — LORAZEPAM 1 MG PO TABS: 1.0000 mg | ORAL_TABLET | Freq: Four times a day (QID) | ORAL | Status: DC | PRN

## 2020-01-14 MED ORDER — PANTOPRAZOLE SODIUM 40 MG PO TBEC
40.0000 mg | DELAYED_RELEASE_TABLET | Freq: Every day | ORAL | Status: DC
  Administered 2020-01-14 – 2020-01-15 (×2): 40 mg via ORAL
  Filled 2020-01-14 (×3): qty 1

## 2020-01-14 MED ORDER — TRAZODONE HCL 50 MG PO TABS
50.0000 mg | ORAL_TABLET | Freq: Every evening | ORAL | Status: DC | PRN
  Administered 2020-01-15: 50 mg via ORAL
  Filled 2020-01-14: qty 1

## 2020-01-14 MED ORDER — METHOCARBAMOL 500 MG PO TABS: 500.0000 mg | ORAL_TABLET | Freq: Three times a day (TID) | ORAL | Status: DC | PRN

## 2020-01-14 MED ORDER — NAPROXEN 500 MG PO TABS: 500.0000 mg | ORAL_TABLET | Freq: Two times a day (BID) | ORAL | Status: DC | PRN

## 2020-01-14 MED ORDER — MAGNESIUM HYDROXIDE 400 MG/5ML PO SUSP: 30.0000 mL | Freq: Every day | ORAL | Status: DC | PRN

## 2020-01-14 NOTE — [facility] Suicide Risk Assessment (Signed)
[facility] Admission Suicide Risk Assessment   Nursing information obtained from:    Demographic factors:    Current Mental Status:    Loss Factors:    Historical Factors:    Risk Reduction Factors:     Total Time spent with patient: 30 minutes Principal Problem: <principal problem not specified> Diagnosis:  Active Problems:   Major depression  Subjective Data: Patient is seen and examined.  Patient is a 64 year old female with a longstanding history of generalized anxiety who presented to the [facility] emergency department on 01/13/2020 after being found by her husband unconscious.  The patient was reportedly found unresponsive next to the bed, and the husband called EMS.  The patient stated that the reason she had taken the excessive amount of tramadol was she was in pain.  She stated that since she had started the tramadol she felt as though her behavior change.  She stated in the day or so that she had been in the emergency department getting the tramadol out of her system she felt better.  She denied any history of any psychiatric evaluations.  She denied any previous suicide attempts.  She denied any previous overdoses.  She denied any previous psychiatric admissions.  She stated that she has been on benzodiazepines for many years.  She stated she has been previously treated with Xanax, lorazepam and now Klonopin.  Review of the PMP database did not reveal any prescriptions for tramadol or Klonopin.  She stated she gets her prescriptions filled at [facility].  Her drug screen on admission was negative for opiates or benzodiazepines, but did have marijuana present.  She stated that she did not use marijuana, but had gotten some "edibles" recently and she thought they were only CBD based.  She denied any alcohol, tobacco smoking, marijuana use or other illicit substances.  She denied any family history of psychiatric illness as well suicide attempts.  She was transferred to our facility for  evaluation and stabilization.  Continued Clinical Symptoms:    The "Alcohol Use Disorders Identification Test", Guidelines for Use in Primary Care, Second Edition.  World Pharmacologist [facility]). Score between 0-7:  no or low risk or alcohol related problems. Score between 8-15:  moderate risk of alcohol related problems. Score between 16-19:  high risk of alcohol related problems. Score 20 or above:  warrants further diagnostic evaluation for alcohol dependence and treatment.   CLINICAL FACTORS:   Severe Anxiety and/or Agitation Depression:   Impulsivity   Musculoskeletal: Strength & Muscle Tone: within normal limits Gait & Station: normal Patient leans: N/A  Psychiatric Specialty Exam: Physical Exam Vitals and nursing note reviewed.  Constitutional:      Appearance: Normal appearance.  HENT:     Head: Normocephalic and atraumatic.  Pulmonary:     Effort: Pulmonary effort is normal.  Neurological:     General: No focal deficit present.     Mental Status: She is alert and oriented to person, place, and time.     Review of Systems  There were no vitals taken for this visit.There is no height or weight on file to calculate BMI.  General Appearance: Casual  Eye Contact:  Good  Speech:  Pressured  Volume:  Increased  Mood:  Anxious  Affect:  Congruent  Thought Process:  Coherent and Descriptions of Associations: Intact  Orientation:  Full (Time, Place, and Person)  Thought Content:  Logical  Suicidal Thoughts:  No  Homicidal Thoughts:  No  Memory:  Immediate;  Fair Recent;   Fair Remote;   Fair  Judgement:  Intact  Insight:  Fair  Psychomotor Activity:  Increased  Concentration:  Concentration: Fair and Attention Span: Fair  Recall:  [employer] of Knowledge:  Good  Language:  Good  Akathisia:  Negative  Handed:  Right  AIMS (if indicated):     Assets:  Desire for Improvement Housing Resilience  ADL's:  Intact  Cognition:  WNL  Sleep:          COGNITIVE FEATURES THAT CONTRIBUTE TO RISK:  None    SUICIDE RISK:   Minimal: No identifiable suicidal ideation.  Patients presenting with no risk factors but with morbid ruminations; may be classified as minimal risk based on the severity of the depressive symptoms  PLAN OF CARE: Patient is seen and examined.  Patient is a 64 year old female with the above-stated past psychiatric history who was admitted after an intentional overdose of tramadol.  She will be admitted to the hospital.  She will be integrated in the milieu.  She will be encouraged to attend groups.  We will continue her clonazepam 1 mg p.o. 3 times daily.  We will have pharmacy confirm that she does get regular prescriptions from that pharmacy.  She did not have any benzodiazepines in her drug screen, so it is unclear whether or not she may have overused this prior to the overdose.  She denies any overuse of these medications.  We discussed the possibility of adding an SSRI or SNRI type medication to assist with her anxiety control as well as her depression.  She denied all depressive symptoms, and stated she did not want to take any other medications.  We did discuss the possibility of the opiate detox protocol for the tramadol to make sure that she did not have any complicating alcohol withdrawal symptoms.  She is amendable to that.  I also told her that we would start Protonix 40 mg p.o. daily for gastric protection because of the nonsteroidal anti-inflammatory agents which she will be on for pain with the protocol.  Review of her laboratories from [facility] showed normal electrolytes.  Normal liver function enzymes.  Acetaminophen was less than 10, salicylate was less than 7.  Blood alcohol was less than 10.  Drug screen was positive for THC.  Her EKG was of poor quality, and we will repeat that.  It did show sinus rhythm with a normal QTc interval, but the baseline is very poor not like set repeated.  We will go on and order a  lipid panel, hemoglobin A1c, TSH as well as a CBC with differential.  We will collect collateral information from her husband in terms of whether or not this may have been a suicide attempt.  I certify that inpatient services furnished can reasonably be expected to improve the patient's condition.   Sharma Covert, MD 01/14/2020, 3:09 PM

## 2020-01-14 NOTE — Progress Notes (Signed)
Initial Nutrition Assessment  DOCUMENTATION CODES:   Underweight  INTERVENTION:   Ensure Enlive po BID, each supplement provides 350 kcal and 20 grams of protein  MVI daily   NUTRITION DIAGNOSIS:   Inadequate oral intake related to social / environmental circumstances (anxiety) as evidenced by per patient/family report.  GOAL:   Patient will meet greater than or equal to 90% of their needs  MONITOR:   PO intake, Supplement acceptance  REASON FOR ASSESSMENT:   Consult Assessment of nutrition requirement/status  ASSESSMENT:   64 y.o. year-old female with a history of cervical cancer, diverticulitis, neuropathy of R wrist, anxiety and lung nodule who is admitted with overdose and suicide attempt.  Met with pt in room today. Pt reports good appetite and oral intake at baseline but reports that her appetite has been decreased lately r/t anxiety. 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 Pt reports that a typical day for her would include making breakfast for herself, her husband and her mom. Pt reports that she will usually eat a snack or crackers at lunch and prepare something for her mom and then she will prepare a nice dinner for everyone at night. Pt reports that she has meals and fast food delivered sometimes when she does not feel like cooking. Pt does not drink any supplements at home but she is open to trying them in hospital (chocolate). Pt reports that her weight has been pretty stable her entire life. Pt reports that she did used to weigh about 10lbs more but a few years ago, she developed diarrhea for about a month and she lost ~10lbs. Pt reports that she was diagnosed with IBS at that time and treated and she has never had any issues since. RD did find one note in pt's chart where she admitted for diarrhea in 2019 but there was no mention of IBS or biopsies taken; pt does have a h/o diverticulitis mentioned in her chart. Per chart, pt appears  to have lost 8lbs(7%) over the past 9 months; this is not significant. RD will add supplements and MVI to help pt meet her estimated needs. Pt documented to be eating 50% of meals in hospital. Pt does appear thin on visualization today. RD highly suspects pt with chronic malnutrition but unable to diagnose at this time as nutrition focused physical exam cannot be performed.   Medications reviewed and include: oscal w/ D, protonix  Labs reviewed:   Diet Order:   Diet Order            Diet regular Room service appropriate? Yes; Fluid consistency: Thin  Diet effective now                EDUCATION NEEDS:   Education needs have been addressed  Skin:  Skin Assessment: Reviewed RN Assessment  Last BM:  unknown  Height:   Ht Readings from Last 1 Encounters:  01/14/20 _0  (1.651 m)    Weight:   Wt Readings from Last 1 Encounters:  01/14/20 49 kg    Ideal Body Weight:  56.8 kg  BMI:  Body mass index is 17.97 kg/m.  Estimated Nutritional Needs:   Kcal:  1400-1600kcal/day  Protein:  70-80g/day  Fluid:  1.3-1.5L/day  Knox Saliva MS, RD, LDN Please refer to [facility] for RD and/or RD on-call/weekend/after hours pager

## 2020-01-14 NOTE — Tx Team (Signed)
Initial Treatment Plan 01/14/2020 2:57 PM [name]:[id number]    PATIENT STRESSORS: Health problems Medication change or noncompliance   PATIENT STRENGTHS: Communication skills Motivation for treatment/growth   PATIENT IDENTIFIED PROBLEMS: Ineffective coping skills  Change in medication                   DISCHARGE CRITERIA:  Improved stabilization in mood, thinking, and/or behavior Verbal commitment to aftercare and medication compliance  PRELIMINARY DISCHARGE PLAN: Outpatient therapy Participate in family therapy Return to previous living arrangement Return to previous work or school arrangements  PATIENT/FAMILY INVOLVEMENT: This treatment plan has been presented to and reviewed with the patient, Allison Dickerson, and/or family member.  The patient and family have been given the opportunity to ask questions and make suggestions.  Aleen Sells, RN 01/14/2020, 2:57 PM

## 2020-01-14 NOTE — H&P (Signed)
Psychiatric Admission Assessment Adult  Patient Identification: Allison Dickerson MRN:  [id number] Date of Evaluation:  01/14/2020 Chief Complaint:  Major depression [F32.9] Principal Diagnosis: <principal problem not specified> Diagnosis:  Active Problems:   Major depression  History of Present Illness: Patient is seen and examined.  Patient is a 64 year old female with a longstanding history of generalized anxiety who presented to the [facility] emergency department on 01/13/2020 after being found by her husband unconscious.  The patient was reportedly found unresponsive next to the bed, and the husband called EMS.  The patient stated that the reason she had taken the excessive amount of tramadol was she was in pain.  She stated that since she had started the tramadol she felt as though her behavior change.  She stated in the day or so that she had been in the emergency department getting the tramadol out of her system she felt better.  She denied any history of any psychiatric evaluations.  She denied any previous suicide attempts.  She denied any previous overdoses.  She denied any previous psychiatric admissions.  She stated that she has been on benzodiazepines for many years.  She stated she has been previously treated with Xanax, lorazepam and now Klonopin.  Review of the PMP database did not reveal any prescriptions for tramadol or Klonopin.  She stated she gets her prescriptions filled at [facility].  Her drug screen on admission was negative for opiates or benzodiazepines, but did have marijuana present.  She stated that she did not use marijuana, but had gotten some "edibles" recently and she thought they were only CBD based.  She denied any alcohol, tobacco smoking, marijuana use or other illicit substances.  She denied any family history of psychiatric illness as well suicide attempts.  She was transferred to our facility for evaluation and stabilization.  Associated  Signs/Symptoms: Depression Symptoms:  insomnia, psychomotor agitation, difficulty concentrating, anxiety, loss of energy/fatigue, (Hypo) Manic Symptoms:  Impulsivity, Anxiety Symptoms:  Excessive Worry, Psychotic Symptoms:  denied PTSD Symptoms: Negative Total Time spent with patient: 30 minutes  Past Psychiatric History: Patient stated that she has been treated with benzodiazepines for anxiety for many years.  She stated she had been on Xanax, lorazepam or Klonopin for at least 16 to 20 years.  She denied any previous psychiatric evaluations, she denied any other psychiatric medications, she denied any previous psychiatric admissions.  Is the patient at risk to self? No.  Has the patient been a risk to self in the past 6 months? No.  Has the patient been a risk to self within the distant past? No.  Is the patient a risk to others? No.  Has the patient been a risk to others in the past 6 months? No.  Has the patient been a risk to others within the distant past? No.   Prior Inpatient Therapy:   Prior Outpatient Therapy:    Alcohol Screening:   Substance Abuse History in the last 12 months:  No. Consequences of Substance Abuse: Negative Previous Psychotropic Medications: No  Psychological Evaluations: No  Past Medical History:  Past Medical History:  Diagnosis Date  . Anxiety   . Cervical cancer ([place])    When younger.  . Diverticulitis   . Pinched nerve    right hand    Past Surgical History:  Procedure Laterality Date  . ABDOMINAL HYSTERECTOMY    . cervical cancer removal     Family History:  Family History  Problem Relation Age of Onset  .  Cancer Father   . Cancer Other    Family Psychiatric  History: Patient denied any family history except for dementia in her mother.  Patient denied any suicide attempts by family members. Tobacco Screening:   Social History:  Social History   Substance and Sexual Activity  Alcohol Use No     Social History   Substance  and Sexual Activity  Drug Use Yes  . Types: Marijuana   Comment: occ    Additional Social History:                           Allergies:   Allergies  Allergen Reactions  . Gluten Meal Diarrhea   Lab Results:  Results for orders placed or performed during the hospital encounter of 01/13/20 (from the past 48 hour(s))  Acetaminophen level     Status: Abnormal   Collection Time: 01/13/20 10:16 AM  Result Value Ref Range   Acetaminophen (Tylenol), Serum <10 (L) 10 - 30 ug/mL    Comment: (NOTE) Therapeutic concentrations vary significantly. A range of 10-30 ug/mL  may be an effective concentration for many patients. However, some  are best treated at concentrations outside of this range. Acetaminophen concentrations >150 ug/mL at 4 hours after ingestion  and >50 ug/mL at 12 hours after ingestion are often associated with  toxic reactions.  Performed at [facility], [address] [place], [place] [address]   Ethanol     Status: None   Collection Time: 01/13/20 10:16 AM  Result Value Ref Range   Alcohol, Ethyl (B) <10 <10 mg/dL    Comment: (NOTE) Lowest detectable limit for serum alcohol is 10 mg/dL.  For medical purposes only. Performed at [facility], [address], [place], [place] [address]   Comprehensive metabolic panel     Status: Abnormal   Collection Time: 01/13/20 10:16 AM  Result Value Ref Range   Sodium 138 135 - 145 mmol/L   Potassium 3.9 3.5 - 5.1 mmol/L   Chloride 106 98 - 111 mmol/L   CO2 22 22 - 32 mmol/L   Glucose, Bld 109 (H) 70 - 99 mg/dL    Comment: Glucose reference range applies only to samples taken after fasting for at least 8 hours.   BUN 19 8 - 23 mg/dL   Creatinine, Ser 0.80 0.44 - 1.00 mg/dL   Calcium 9.3 8.9 - 10.3 mg/dL   Total Protein 7.1 6.5 - 8.1 g/dL   Albumin 3.5 3.5 - 5.0 g/dL   AST 22 15 - 41 U/L   ALT 19 0 - 44 U/L   Alkaline Phosphatase 76 38 - 126 U/L   Total Bilirubin 0.5 0.3 - 1.2 mg/dL   GFR calc non Af Amer >60  >60 mL/min   GFR calc Af Amer >60 >60 mL/min   Anion gap 10 5 - 15    Comment: Performed at [facility], [address], [place], [place] [address]  Salicylate level     Status: Abnormal   Collection Time: 01/13/20 10:16 AM  Result Value Ref Range   Salicylate Lvl <5.3 (L) 7.0 - 30.0 mg/dL    Comment: Performed at [facility], [address] [place], [place] [address]  Magnesium     Status: None   Collection Time: 01/13/20 10:16 AM  Result Value Ref Range   Magnesium 1.9 1.7 - 2.4 mg/dL    Comment: Performed at [facility], [address], [place], [place] [address]  Urine rapid drug screen (hosp performed)     Status: Abnormal   Collection Time: 01/13/20 10:51 AM  Result Value Ref Range   Opiates NONE DETECTED NONE DETECTED   Cocaine NONE DETECTED NONE DETECTED   Benzodiazepines NONE DETECTED NONE DETECTED   Amphetamines NONE DETECTED NONE DETECTED   Tetrahydrocannabinol POSITIVE (A) NONE DETECTED   Barbiturates NONE DETECTED NONE DETECTED    Comment: (NOTE) DRUG SCREEN FOR MEDICAL PURPOSES ONLY.  IF CONFIRMATION IS NEEDED FOR ANY PURPOSE, NOTIFY LAB WITHIN 5 DAYS.  LOWEST DETECTABLE LIMITS FOR URINE DRUG SCREEN Drug Class                     Cutoff (ng/mL) Amphetamine and metabolites    1000 Barbiturate and metabolites    200 Benzodiazepine                 782 Tricyclics and metabolites     300 Opiates and metabolites        300 Cocaine and metabolites        300 THC                            50 Performed at [facility], [address] [place], [place] [address]   Acetaminophen level     Status: Abnormal   Collection Time: 01/13/20  2:25 PM  Result Value Ref Range   Acetaminophen (Tylenol), Serum <10 (L) 10 - 30 ug/mL    Comment: (NOTE) Therapeutic concentrations vary significantly. A range of 10-30 ug/mL  may be an effective concentration for many patients. However, some  are best treated at concentrations outside of this range. Acetaminophen concentrations >150  ug/mL at 4 hours after ingestion  and >50 ug/mL at 12 hours after ingestion are often associated with  toxic reactions.  Performed at [facility], [address], [place], [place] [address]   SARS Coronavirus 2 by RT PCR (hospital order, performed in [facility] hospital lab) Nasopharyngeal Nasopharyngeal Swab     Status: None   Collection Time: 01/14/20 10:20 AM   Specimen: Nasopharyngeal Swab  Result Value Ref Range   SARS Coronavirus 2 NEGATIVE NEGATIVE    Comment: (NOTE) SARS-CoV-2 target nucleic acids are NOT DETECTED.  The SARS-CoV-2 RNA is generally detectable in upper and lower respiratory specimens during the acute phase of infection. The lowest concentration of SARS-CoV-2 viral copies this assay can detect is 250 copies / mL. A negative result does not preclude SARS-CoV-2 infection and should not be used as the sole basis for treatment or other patient management decisions.  A negative result may occur with improper specimen collection / handling, submission of specimen other than nasopharyngeal swab, presence of viral mutation(s) within the areas targeted by this assay, and inadequate number of viral copies (<250 copies / mL). A negative result must be combined with clinical observations, patient history, and epidemiological information.  Fact Sheet for Patients:   StrictlyIdeas.no  Fact Sheet for Healthcare Providers: BankingDealers.co.za  This test is not yet approved or  cleared by the [place] FDA and has been authorized for detection and/or diagnosis of SARS-CoV-2 by FDA under an Emergency Use Authorization (EUA).  This EUA will remain in effect (meaning this test can be used) for the duration of the COVID-19 declaration under Section 564(b)(1) of the Act, 21 U.S.C. section 360bbb-3(b)(1), unless the authorization is terminated or revoked sooner.  Performed at [facility], [address]  [place], [place]  [address]     Blood Alcohol level:  Lab Results  Component Value Date   ETH <10 66/11/3014    Metabolic Disorder Labs:  No results found for: HGBA1C, MPG No results found for: PROLACTIN Lab Results  Component Value Date   CHOL 175 02/19/2017   TRIG 76 02/19/2017   HDL 57 02/19/2017   CHOLHDL 3.1 02/19/2017   VLDL 15 02/19/2017   LDLCALC 103 (H) 02/19/2017    Current Medications: Current Facility-Administered Medications  Medication Dose Route Frequency Provider Last Rate Last Admin  . acetaminophen (TYLENOL) tablet 650 mg  650 mg Oral Q6H PRN Sharma Covert, MD      . alum & mag hydroxide-simeth (MAALOX/MYLANTA) 200-200-20 MG/5ML suspension 30 mL  30 mL Oral Q4H PRN Sharma Covert, MD      . Derrill Memo ON 01/15/2020] calcium-vitamin D (OSCAL WITH D) 500-200 MG-UNIT per tablet 1 tablet  1 tablet Oral Q breakfast Mallie Darting, Cordie Grice, MD      . clonazePAM Bobbye Charleston) tablet 1 mg  1 mg Oral TID Sharma Covert, MD      . dicyclomine (BENTYL) tablet 20 mg  20 mg Oral Q6H PRN Sharma Covert, MD      . hydrOXYzine (ATARAX/VISTARIL) tablet 25 mg  25 mg Oral TID PRN Sharma Covert, MD      . loperamide (IMODIUM) capsule 2-4 mg  2-4 mg Oral PRN Sharma Covert, MD      . magnesium hydroxide (MILK OF MAGNESIA) suspension 30 mL  30 mL Oral Daily PRN Sharma Covert, MD      . methocarbamol (ROBAXIN) tablet 500 mg  500 mg Oral Q8H PRN Sharma Covert, MD      . naproxen (NAPROSYN) tablet 250 mg  250 mg Oral TID WC Sharma Covert, MD      . ondansetron (ZOFRAN-ODT) disintegrating tablet 4 mg  4 mg Oral Q6H PRN Sharma Covert, MD      . pantoprazole (PROTONIX) EC tablet 40 mg  40 mg Oral Daily Sharma Covert, MD      . traZODone (DESYREL) tablet 50 mg  50 mg Oral QHS PRN Sharma Covert, MD       PTA Medications: Medications Prior to Admission  Medication Sig Dispense Refill Last Dose  . albuterol (PROVENTIL HFA;VENTOLIN HFA) 108 (90 Base) MCG/ACT inhaler  Inhale 1-2 puffs into the lungs every 6 (six) hours as needed for wheezing or shortness of breath. (Patient not taking: Reported on 04/12/2019) 1 Inhaler 0   . clonazePAM (KLONOPIN) 1 MG tablet Take 1 tablet (1 mg total) by mouth 3 (three) times daily as needed for anxiety. (Patient taking differently: Take 1 mg by mouth 3 (three) times daily. ) 90 tablet 0   . dicyclomine (BENTYL) 20 MG tablet Take 1 tablet (20 mg total) by mouth 2 (two) times daily. (Patient not taking: Reported on 04/12/2019) 20 tablet 0   . naproxen sodium (ANAPROX) 220 MG tablet Take 220 mg by mouth daily as needed (FOR PAIN).      Marland Kitchen traMADol (ULTRAM) 50 MG tablet Take 50 mg by mouth every 4 (four) hours as needed.       Musculoskeletal: Strength & Muscle Tone: within normal limits Gait & Station: normal Patient leans: N/A  Psychiatric Specialty Exam: Physical Exam Vitals and nursing note reviewed.  HENT:     Head: Normocephalic and atraumatic.  Pulmonary:     Effort: Pulmonary effort  is normal.  Neurological:     General: No focal deficit present.     Mental Status: She is alert and oriented to person, place, and time.     Review of Systems  There were no vitals taken for this visit.There is no height or weight on file to calculate BMI.  General Appearance: Casual  Eye Contact:  Fair  Speech:  Pressured  Volume:  Increased  Mood:  Anxious  Affect:  Congruent  Thought Process:  Coherent and Descriptions of Associations: Intact  Orientation:  Full (Time, Place, and Person)  Thought Content:  Logical  Suicidal Thoughts:  No  Homicidal Thoughts:  No  Memory:  Immediate;   Fair Recent;   Fair Remote;   Fair  Judgement:  Intact  Insight:  Fair  Psychomotor Activity:  Increased  Concentration:  Concentration: Fair and Attention Span: Fair  Recall:  [employer] of Knowledge:  Fair  Language:  Good  Akathisia:  Negative  Handed:  Right  AIMS (if indicated):     Assets:  Desire for Improvement Resilience   ADL's:  Intact  Cognition:  WNL  Sleep:       Treatment Plan Summary: Daily contact with patient to assess and evaluate symptoms and progress in treatment, Medication management and Plan : Patient is seen and examined.  Patient is a 64 year old female with the above-stated past psychiatric history who was admitted after an intentional overdose of tramadol.  She will be admitted to the hospital.  She will be integrated in the milieu.  She will be encouraged to attend groups.  We will continue her clonazepam 1 mg p.o. 3 times daily.  We will have pharmacy confirm that she does get regular prescriptions from that pharmacy.  She did not have any benzodiazepines in her drug screen, so it is unclear whether or not she may have overused this prior to the overdose.  She denies any overuse of these medications.  We discussed the possibility of adding an SSRI or SNRI type medication to assist with her anxiety control as well as her depression.  She denied all depressive symptoms, and stated she did not want to take any other medications.  We did discuss the possibility of the opiate detox protocol for the tramadol to make sure that she did not have any complicating alcohol withdrawal symptoms.  She is amendable to that.  I also told her that we would start Protonix 40 mg p.o. daily for gastric protection because of the nonsteroidal anti-inflammatory agents which she will be on for pain with the protocol.  Review of her laboratories from [facility] showed normal electrolytes.  Normal liver function enzymes.  Acetaminophen was less than 10, salicylate was less than 7.  Blood alcohol was less than 10.  Drug screen was positive for THC.  Her EKG was of poor quality, and we will repeat that.  It did show sinus rhythm with a normal QTc interval, but the baseline is very poor not like set repeated.  We will go on and order a lipid panel, hemoglobin A1c, TSH as well as a CBC with differential.  We will collect collateral  information from her husband in terms of whether or not this may have been a suicide attempt.  Observation Level/Precautions:  15 minute checks  Laboratory:  Chemistry Profile  Psychotherapy:    Medications:    Consultations:    Discharge Concerns:    Estimated LOS:  Other:     Physician Treatment  Plan for Primary Diagnosis: <principal problem not specified> Long Term Goal(s): Improvement in symptoms so as ready for discharge  Short Term Goals: Ability to identify changes in lifestyle to reduce recurrence of condition will improve, Ability to verbalize feelings will improve, Ability to disclose and discuss suicidal ideas, Ability to demonstrate self-control will improve, Ability to identify and develop effective coping behaviors will improve and Ability to maintain clinical measurements within normal limits will improve  Physician Treatment Plan for Secondary Diagnosis: Active Problems:   Major depression  Long Term Goal(s): Improvement in symptoms so as ready for discharge  Short Term Goals: Ability to identify changes in lifestyle to reduce recurrence of condition will improve, Ability to verbalize feelings will improve, Ability to disclose and discuss suicidal ideas, Ability to demonstrate self-control will improve, Ability to identify and develop effective coping behaviors will improve and Ability to maintain clinical measurements within normal limits will improve  I certify that inpatient services furnished can reasonably be expected to improve the patient's condition.    Sharma Covert, MD 7/30/20213:17 PM

## 2020-01-14 NOTE — Progress Notes (Signed)
"  Allison Dickerson Branchis a 64 y.o.female presents to the emergency department after intentional overdose this morning on tramadol. Patient took 12 tramadol tablets in an attempt to harm herself. The medication bottle and tablets were not found on scene the patient was found to have Flexeril and Klonopin with pills left in the bottle. Time of overdose is unclear. EMS was called by the patient's husband.Patient denies any vomiting after taking the medication. She denies drinking alcohol or using drugs. She denies taking other pills.She does state that this was an attempt to harm herself".    Pt reports she has felt less "empathetic" since taking this medication and does not want to continue on this medication, but states it does help her sleep at night. Pt reports she no longer wants to take Tramadol and wants a new PCP feels he is not helping her.   Skin assessment preformed upon admission by this writer and Caryl Asp, Therapist, sports.

## 2020-01-14 NOTE — BH Assessment (Signed)
Patient has been accepted to [facility].  Accepting physician is Dr. Mallie Darting.  Attending  Physician will be Dr. Mallie Darting.  Patient has been assigned to room 305, by [place].   Call report to [phone].  Representative/Transfer Coordinator is [place] Patient pre-admitted by [facility] ([place])  [place] [facility] Staff Magda Paganini, Camera operator) made aware of acceptance.

## 2020-01-14 NOTE — Progress Notes (Signed)
Admission Note:   Pt is a 64 years old female, admitted to [facility] on a voluntary basis, status post overdose on Ultram, husband found pt and called EMS who brought pt to the ED.  Pt reports this is her first psychiatric hospitalization, reports she was depressed and anxious related to the stressors of caring for her elderly mother at her home over this last year while she has her own medical issues. Pt reports she has arthritis, has trouble cleaning and caring for her large home and mother at the same time, has IBS, and anxiety, (has been taking benzodiazapine daily since age 65). 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 Since admission pt spends time talking to her husband on the phone, rests in her room quietly, and has been medication compliant. Pt denies any current suicidal ideation, homicidal ideation, denies hallucinations. Will continue to monitor pt per Q15 minute face checks and monitor for safety and progress.

## 2020-01-14 NOTE — Progress Notes (Signed)
Patient calm and pleasant during assessment denying SI/HI/AVH and pain. Patient endorses mild anxiety and depression. Patient complained about a cough and the NP was notified, check MAR. Patient didn't have any medications scheduled this evening. Patient observed interacting appropriately with staff and peers on the unit. Patient being monitored Q 15 minutes for safety per unit protocol. Patient remains safe on the unit.

## 2020-01-14 NOTE — Plan of Care (Signed)
Patient new to the unit tonight, hasn't had time to progress  Problem: Education: Goal: Ability to state activities that reduce stress will improve Outcome: Not Progressing   Problem: Coping: Goal: Ability to identify and develop effective coping behavior will improve Outcome: Not Progressing   Problem: Self-Concept: Goal: Ability to identify factors that promote anxiety will improve Outcome: Not Progressing   Problem: Education: Goal: Knowledge of General Education information will improve Description: Including pain rating scale, medication(s)/side effects and non-pharmacologic comfort measures Outcome: Not Progressing

## 2020-01-14 NOTE — ED Notes (Signed)
Called [facility] for transport to [facility].

## 2020-01-15 DIAGNOSIS — F4323 Adjustment disorder with mixed anxiety and depressed mood: Secondary | ICD-10-CM

## 2020-01-15 LAB — LIPID PANEL
Cholesterol: 229 mg/dL — ABNORMAL HIGH (ref 0–200)
HDL: 70 mg/dL (ref >40)
LDL Cholesterol: 142 mg/dL — ABNORMAL HIGH (ref 0–99)
Total CHOL/HDL Ratio: 3.3 RATIO
Triglycerides: 85 mg/dL (ref <150)
VLDL: 17 mg/dL (ref 0–40)

## 2020-01-15 LAB — CBC WITH DIFFERENTIAL/PLATELET
Abs Immature Granulocytes: 0.03 10*3/uL (ref 0.00–0.07)
Basophils Absolute: 0.1 10*3/uL (ref 0.0–0.1)
Basophils Relative: 1 %
Eosinophils Absolute: 0.6 10*3/uL — ABNORMAL HIGH (ref 0.0–0.5)
Eosinophils Relative: 7 %
HCT: 40.4 % (ref 36.0–46.0)
Hemoglobin: 13.4 g/dL (ref 12.0–15.0)
Immature Granulocytes: 0 %
Lymphocytes Relative: 23 %
Lymphs Abs: 2 10*3/uL (ref 0.7–4.0)
MCH: 30.7 pg (ref 26.0–34.0)
MCHC: 33.2 g/dL (ref 30.0–36.0)
MCV: 92.4 fL (ref 80.0–100.0)
Monocytes Absolute: 0.7 10*3/uL (ref 0.1–1.0)
Monocytes Relative: 8 %
Neutro Abs: 5.5 10*3/uL (ref 1.7–7.7)
Neutrophils Relative %: 61 %
Platelets: 307 10*3/uL (ref 150–400)
RBC: 4.37 MIL/uL (ref 3.87–5.11)
RDW: 12.2 % (ref 11.5–15.5)
WBC: 9 10*3/uL (ref 4.0–10.5)
nRBC: 0 % (ref 0.0–0.2)

## 2020-01-15 LAB — TSH: TSH: 1.022 u[IU]/mL (ref 0.350–4.500)

## 2020-01-15 LAB — HEMOGLOBIN A1C
Hgb A1c MFr Bld: 5.7 % — ABNORMAL HIGH (ref 4.8–5.6)
Mean Plasma Glucose: 116.89 mg/dL

## 2020-01-15 NOTE — Tx Team (Signed)
Interdisciplinary Treatment and Diagnostic Plan Update  01/15/2020 Time of Session: 9:23 AM Allison Dickerson MRN: [id number]  Principal Diagnosis: <principal problem not specified>  Secondary Diagnoses: Active Problems:   Major depression   Current Medications:  Current Facility-Administered Medications  Medication Dose Route Frequency Provider Last Rate Last Admin  . acetaminophen (TYLENOL) tablet 650 mg  650 mg Oral Q6H PRN Sharma Covert, MD      . alum & mag hydroxide-simeth (MAALOX/MYLANTA) 200-200-20 MG/5ML suspension 30 mL  30 mL Oral Q4H PRN Sharma Covert, MD      . calcium-vitamin D (OSCAL WITH D) 500-200 MG-UNIT per tablet 1 tablet  1 tablet Oral Q breakfast Sharma Covert, MD   1 tablet at 01/15/20 [phone]  . clonazePAM (KLONOPIN) tablet 1 mg  1 mg Oral TID Sharma Covert, MD   1 mg at 01/15/20 0803  . dicyclomine (BENTYL) tablet 20 mg  20 mg Oral Q6H PRN Sharma Covert, MD      . feeding supplement (ENSURE ENLIVE) (ENSURE ENLIVE) liquid 237 mL  237 mL Oral BID BM Sharma Covert, MD   237 mL at 01/15/20 0958  . hydrOXYzine (ATARAX/VISTARIL) tablet 25 mg  25 mg Oral TID PRN Sharma Covert, MD      . loperamide (IMODIUM) capsule 2-4 mg  2-4 mg Oral PRN Sharma Covert, MD      . magnesium hydroxide (MILK OF MAGNESIA) suspension 30 mL  30 mL Oral Daily PRN Sharma Covert, MD      . menthol-cetylpyridinium (CEPACOL) lozenge 3 mg  1 lozenge Oral PRN Deloria Lair, NP      . methocarbamol (ROBAXIN) tablet 500 mg  500 mg Oral Q8H PRN Sharma Covert, MD      . multivitamin with minerals tablet 1 tablet  1 tablet Oral Daily Sharma Covert, MD   1 tablet at 01/15/20 0803  . naproxen (NAPROSYN) tablet 250 mg  250 mg Oral TID WC Sharma Covert, MD   250 mg at 01/15/20 0802  . ondansetron (ZOFRAN-ODT) disintegrating tablet 4 mg  4 mg Oral Q6H PRN Sharma Covert, MD      . pantoprazole (PROTONIX) EC tablet 40 mg  40 mg Oral Daily Sharma Covert, MD   40 mg at 01/15/20 0802  . traZODone (DESYREL) tablet 50 mg  50 mg Oral QHS PRN Sharma Covert, MD       PTA Medications: Medications Prior to Admission  Medication Sig Dispense Refill Last Dose  . albuterol (PROVENTIL HFA;VENTOLIN HFA) 108 (90 Base) MCG/ACT inhaler Inhale 1-2 puffs into the lungs every 6 (six) hours as needed for wheezing or shortness of breath. (Patient not taking: Reported on 04/12/2019) 1 Inhaler 0   . clonazePAM (KLONOPIN) 1 MG tablet Take 1 tablet (1 mg total) by mouth 3 (three) times daily as needed for anxiety. (Patient taking differently: Take 1 mg by mouth 3 (three) times daily. ) 90 tablet 0   . dicyclomine (BENTYL) 20 MG tablet Take 1 tablet (20 mg total) by mouth 2 (two) times daily. (Patient not taking: Reported on 04/12/2019) 20 tablet 0   . naproxen sodium (ANAPROX) 220 MG tablet Take 220 mg by mouth daily as needed (FOR PAIN).      Marland Kitchen traMADol (ULTRAM) 50 MG tablet Take 50 mg by mouth every 4 (four) hours as needed.       Patient Stressors: Health problems Medication change or noncompliance  Patient Strengths: Agricultural engineer for treatment/growth  Treatment Modalities: Medication Management, Group therapy, Case management,  1 to 1 session with clinician, Psychoeducation, Recreational therapy.   Physician Treatment Plan for Primary Diagnosis: <principal problem not specified> Long Term Goal(s): Improvement in symptoms so as ready for discharge Improvement in symptoms so as ready for discharge   Short Term Goals: Ability to identify changes in lifestyle to reduce recurrence of condition will improve Ability to verbalize feelings will improve Ability to disclose and discuss suicidal ideas Ability to demonstrate self-control will improve Ability to identify and develop effective coping behaviors will improve Ability to maintain clinical measurements within normal limits will improve Ability to identify changes in  lifestyle to reduce recurrence of condition will improve Ability to verbalize feelings will improve Ability to disclose and discuss suicidal ideas Ability to demonstrate self-control will improve Ability to identify and develop effective coping behaviors will improve Ability to maintain clinical measurements within normal limits will improve  Medication Management: Evaluate patient's response, side effects, and tolerance of medication regimen.  Therapeutic Interventions: 1 to 1 sessions, Unit Group sessions and Medication administration.  Evaluation of Outcomes: Not Met  Physician Treatment Plan for Secondary Diagnosis: Active Problems:   Major depression  Long Term Goal(s): Improvement in symptoms so as ready for discharge Improvement in symptoms so as ready for discharge   Short Term Goals: Ability to identify changes in lifestyle to reduce recurrence of condition will improve Ability to verbalize feelings will improve Ability to disclose and discuss suicidal ideas Ability to demonstrate self-control will improve Ability to identify and develop effective coping behaviors will improve Ability to maintain clinical measurements within normal limits will improve Ability to identify changes in lifestyle to reduce recurrence of condition will improve Ability to verbalize feelings will improve Ability to disclose and discuss suicidal ideas Ability to demonstrate self-control will improve Ability to identify and develop effective coping behaviors will improve Ability to maintain clinical measurements within normal limits will improve     Medication Management: Evaluate patient's response, side effects, and tolerance of medication regimen.  Therapeutic Interventions: 1 to 1 sessions, Unit Group sessions and Medication administration.  Evaluation of Outcomes: Not Met   RN Treatment Plan for Primary Diagnosis: <principal problem not specified> Long Term Goal(s): Knowledge of disease  and therapeutic regimen to maintain health will improve  Short Term Goals: Ability to remain free from injury will improve, Ability to verbalize frustration and anger appropriately will improve, Ability to demonstrate self-control, Ability to participate in decision making will improve, Ability to verbalize feelings will improve, Ability to disclose and discuss suicidal ideas, Ability to identify and develop effective coping behaviors will improve and Compliance with prescribed medications will improve  Medication Management: RN will administer medications as ordered by provider, will assess and evaluate patient's response and provide education to patient for prescribed medication. RN will report any adverse and/or side effects to prescribing provider.  Therapeutic Interventions: 1 on 1 counseling sessions, Psychoeducation, Medication administration, Evaluate responses to treatment, Monitor vital signs and CBGs as ordered, Perform/monitor CIWA, COWS, AIMS and Fall Risk screenings as ordered, Perform wound care treatments as ordered.  Evaluation of Outcomes: Not Met   LCSW Treatment Plan for Primary Diagnosis: <principal problem not specified> Long Term Goal(s): Safe transition to appropriate next level of care at discharge, Engage patient in therapeutic group addressing interpersonal concerns.  Short Term Goals: Engage patient in aftercare planning with referrals and resources, Increase social support, Increase ability to appropriately verbalize  feelings, Increase emotional regulation, Facilitate acceptance of mental health diagnosis and concerns, Identify triggers associated with mental health/substance abuse issues and Increase skills for wellness and recovery  Therapeutic Interventions: Assess for all discharge needs, 1 to 1 time with Social worker, Explore available resources and support systems, Assess for adequacy in community support network, Educate family and significant other(s) on suicide  prevention, Complete Psychosocial Assessment, Interpersonal group therapy.  Evaluation of Outcomes: Not Met   Progress in Treatment: Attending groups: No. Patient is new to the unit and has not had the opportunity to attend group.   Participating in groups: No. Patient is new to the unit and has not had the opportunity to attend group.   Taking medication as prescribed: Yes. Toleration medication: Yes. Family/Significant other contact made: No, will contact:  PSA needs to be completed to identify a friend/family member for SPE Patient understands diagnosis: No. Patient minimized overdosing on medication.  Discussing patient identified problems/goals with staff: Yes. Patient spoke about goals to accomplish but minimized why she was in the hospital.   Medical problems stabilized or resolved: No. Denies suicidal/homicidal ideation: Yes. Issues/concerns per patient self-inventory: Yes. Patient voiced wanting to go home Other:   New problem(s) identified: No, Describe:  None  New Short Term/Long Term Goal(s): Elimination of symptoms of psychosis, medication management for mood stabilization; development of comprehensive mental wellness plan  Patient Goals:  Patient stated one of her goals is to get [place] for her 15 year old mother who she cares for. Patient also spoke about getting out in the yard more, shopping, and practicing self-care (wearing make-up, taking a hot bath, more rest).   Discharge Plan or Barriers: Patient at this time will be discharged back home to her to her husband. A psychiatrist referral will be made. Patient stated she would like to speak with a provider online due to other responsibilities she has at home.   Reason for Continuation of Hospitalization: Depression Suicidal ideation  Estimated Length of Stay: TBD 5-7 days. Dr. Danella Sensing plans to speak with patients husband 01/16/20 to determine discharge.   Attendees: Patient: Allison Dickerson  01/15/2020 10:12 AM   Physician: Larita Fife, MD 01/15/2020 10:12 AM  Nursing: Graceann Congress, RN 01/15/2020 10:12 AM  RN Care Manager: 01/15/2020 10:12 AM  Social Worker: Raina Mina Grace Blight LCSW 01/15/2020 10:12 AM  Recreational Therapist:  01/15/2020 10:12 AM  Other:  01/15/2020 10:12 AM  Other:  01/15/2020 10:12 AM  Other: 01/15/2020 10:12 AM    Scribe for Treatment Team: Raina Mina, [place] 01/15/2020 10:12 AM

## 2020-01-15 NOTE — Progress Notes (Signed)
[facility] MD Progress Note  01/15/2020 12:38 PM Allison Dickerson  MRN:  [id number]  Principal Problem: <principal problem not specified> Diagnosis: Active Problems:   Major depression  Allison Dickerson is a 64 y.o. female that has a previous psychiatric history of anxiety who presents to the [facility] unit for treatment of depression in the context of overdose on Tramadol.   Interval History Patient was seen today with a whole team for re-evaluation.  Nursing reports no events overnight. The patient reports no issues with performing ADLs.  Patient has been medication compliant.  The patient reports no side effects from medications.  Current symptoms being addressed include:  Depression, anxiety.  Since last assessment, patient reports symptoms have improved greatly.    SUBJECTIVE: On assessment patient reports "feeling good". She adamantly denies she tried to kill self. She reports there are a lot on her plate at home, such as taking care of elderly mother and dealing with her own medical issues, being in constant pain due to arthritis. She reports she "got overwhelmed" and took more medications to help self "feeling better", not to kill self. She reports feeling deeply shocked that she ended up in the ER and in the [facility] unit now. She reports feeling "happy and thankful" to be alive. She reports that her father was a priest and that she is a woman of faith and that she would never even think of killing herself. She denies any previous suicide attempts. She denied any previous overdoses. She denied any previous psychiatric admissions. She stated that she has been on benzodiazepines for many years for anxiety. She denies being treated for depression and does not think she is depressed currently.She stated she gets her prescriptions from PCP and filled at [facility]. She is agreeable to be referred to a psychiatrist and thinks she might benefit from therapy. She denies any current mental complaints and asks about  discharge. She gave me permission to contact her husband for collateral information.     Current suicidal/homicidal ideations: Denies Current auditory/visual hallucinations: Denies  Review Of Systems: A complete review of systems of the following systems was conducted (Constitutional, Psychiatric, Neurological, Musculoskeletal, Eyes, Gastrointestinal, Cardiovascular, Respiratory, Skin, and Endocrine). All reviewed systems are negative except pertinent positives identified in the HPI.  Labs: no new labs for review.  Collateral information obtained from patients husband, Allison Dickerson ([phone]): they are married for about 73 years and he never had any concern that his wife is depressed or suicidal. He does not believe she wanted to kill self that time. She is overwhelmed with current home responsibilities and her own health. She never expresses suicidal thoughts, plans. She would benefit from therapy to discuss her issues with a professional. There is no concern for substance use. He feels she can be safely discharged home when ready and he and their son will monitor her mood and behavior.     Total Time spent with patient: 30 minutes  Past Psychiatric History: see H&P  Past Medical History:  Past Medical History:  Diagnosis Date  . Anxiety   . Cervical cancer ([place])    When younger.  . Diverticulitis   . Pinched nerve    right hand    Past Surgical History:  Procedure Laterality Date  . ABDOMINAL HYSTERECTOMY    . cervical cancer removal     Family History:  Family History  Problem Relation Age of Onset  . Cancer Father   . Cancer Other    Family Psychiatric  History: see H&P  Social History:  Social History   Substance and Sexual Activity  Alcohol Use No     Social History   Substance and Sexual Activity  Drug Use Yes  . Types: Marijuana   Comment: occ    Social History   Socioeconomic History  . Marital status: Married    Spouse name: Not on file  .  Number of children: Not on file  . Years of education: Not on file  . Highest education level: Not on file  Occupational History  . Not on file  Tobacco Use  . Smoking status: Former Smoker    Packs/day: 0.50    Years: 20.00    Pack years: 10.00    Types: Cigarettes  . Smokeless tobacco: Never Used  Substance and Sexual Activity  . Alcohol use: No  . Drug use: Yes    Types: Marijuana    Comment: occ  . Sexual activity: Not on file  Other Topics Concern  . Not on file  Social History Narrative  . Not on file   Social Determinants of Health   Financial Resource Strain:   . Difficulty of Paying Living Expenses:   Food Insecurity:   . Worried About Charity fundraiser in the Last Year:   . Arboriculturist in the Last Year:   Transportation Needs:   . Film/video editor (Medical):   Marland Kitchen Lack of Transportation (Non-Medical):   Physical Activity:   . Days of Exercise per Week:   . Minutes of Exercise per Session:   Stress:   . Feeling of Stress :   Social Connections:   . Frequency of Communication with Friends and Family:   . Frequency of Social Gatherings with Friends and Family:   . Attends Religious Services:   . Active Member of Clubs or Organizations:   . Attends Archivist Meetings:   Marland Kitchen Marital Status:    Additional Social History:                         Sleep: Good  Appetite:  Good  Current Medications: Current Facility-Administered Medications  Medication Dose Route Frequency Provider Last Rate Last Admin  . acetaminophen (TYLENOL) tablet 650 mg  650 mg Oral Q6H PRN Sharma Covert, MD      . alum & mag hydroxide-simeth (MAALOX/MYLANTA) 200-200-20 MG/5ML suspension 30 mL  30 mL Oral Q4H PRN Sharma Covert, MD      . calcium-vitamin D (OSCAL WITH D) 500-200 MG-UNIT per tablet 1 tablet  1 tablet Oral Q breakfast Sharma Covert, MD   1 tablet at 01/15/20 [phone]  . clonazePAM (KLONOPIN) tablet 1 mg  1 mg Oral TID Sharma Covert, MD   1 mg at 01/15/20 1134  . dicyclomine (BENTYL) tablet 20 mg  20 mg Oral Q6H PRN Sharma Covert, MD      . feeding supplement (ENSURE ENLIVE) (ENSURE ENLIVE) liquid 237 mL  237 mL Oral BID BM Sharma Covert, MD   237 mL at 01/15/20 0958  . hydrOXYzine (ATARAX/VISTARIL) tablet 25 mg  25 mg Oral TID PRN Sharma Covert, MD      . loperamide (IMODIUM) capsule 2-4 mg  2-4 mg Oral PRN Sharma Covert, MD      . magnesium hydroxide (MILK OF MAGNESIA) suspension 30 mL  30 mL Oral Daily PRN Sharma Covert, MD      .  menthol-cetylpyridinium (CEPACOL) lozenge 3 mg  1 lozenge Oral PRN Dixon, Rashaun M, NP      . methocarbamol (ROBAXIN) tablet 500 mg  500 mg Oral Q8H PRN Sharma Covert, MD      . multivitamin with minerals tablet 1 tablet  1 tablet Oral Daily Sharma Covert, MD   1 tablet at 01/15/20 0803  . naproxen (NAPROSYN) tablet 250 mg  250 mg Oral TID WC Sharma Covert, MD   250 mg at 01/15/20 1134  . ondansetron (ZOFRAN-ODT) disintegrating tablet 4 mg  4 mg Oral Q6H PRN Sharma Covert, MD      . pantoprazole (PROTONIX) EC tablet 40 mg  40 mg Oral Daily Sharma Covert, MD   40 mg at 01/15/20 0802  . traZODone (DESYREL) tablet 50 mg  50 mg Oral QHS PRN Sharma Covert, MD        Lab Results:  Results for orders placed or performed during the hospital encounter of 01/14/20 (from the past 48 hour(s))  TSH     Status: None   Collection Time: 01/15/20  6:27 AM  Result Value Ref Range   TSH 1.022 0.350 - 4.500 uIU/mL    Comment: Performed by a 3rd Generation assay with a functional sensitivity of <=0.01 uIU/mL. Performed at [facility], [address] [place], [place] [address]   CBC with Differential/Platelet     Status: Abnormal   Collection Time: 01/15/20  6:27 AM  Result Value Ref Range   WBC 9.0 4.0 - 10.5 K/uL   RBC 4.37 3.87 - 5.11 MIL/uL   Hemoglobin 13.4 12.0 - 15.0 g/dL   HCT 40.4 36 - 46 %   MCV 92.4 80.0 - 100.0 fL   MCH  30.7 26.0 - 34.0 pg   MCHC 33.2 30.0 - 36.0 g/dL   RDW 12.2 11.5 - 15.5 %   Platelets 307 150 - 400 K/uL   nRBC 0.0 0.0 - 0.2 %   Neutrophils Relative % 61 %   Neutro Abs 5.5 1.7 - 7.7 K/uL   Lymphocytes Relative 23 %   Lymphs Abs 2.0 0.7 - 4.0 K/uL   Monocytes Relative 8 %   Monocytes Absolute 0.7 0 - 1 K/uL   Eosinophils Relative 7 %   Eosinophils Absolute 0.6 (H) 0 - 0 K/uL   Basophils Relative 1 %   Basophils Absolute 0.1 0 - 0 K/uL   Immature Granulocytes 0 %   Abs Immature Granulocytes 0.03 0.00 - 0.07 K/uL    Comment: Performed at [facility], [address] [place], [place] [address]  Lipid panel     Status: Abnormal   Collection Time: 01/15/20  6:27 AM  Result Value Ref Range   Cholesterol 229 (H) 0 - 200 mg/dL   Triglycerides 85 <150 mg/dL   HDL 70 >40 mg/dL   Total CHOL/HDL Ratio 3.3 RATIO   VLDL 17 0 - 40 mg/dL   LDL Cholesterol 142 (H) 0 - 99 mg/dL    Comment:        Total Cholesterol/HDL:CHD Risk Coronary Heart Disease Risk Table                     Men   Women  1/2 Average Risk   3.4   3.3  Average Risk       5.0   4.4  2 X Average Risk   9.6   7.1  3 X Average Risk  23.4   11.0        Use the calculated Patient Ratio above and the CHD Risk Table to determine the patient's CHD Risk.        ATP III CLASSIFICATION (LDL):  <100     mg/dL   Optimal  100-129  mg/dL   Near or Above                    Optimal  130-159  mg/dL   Borderline  160-189  mg/dL   High  >190     mg/dL   Very High Performed at [facility], [address] [place], [place] [address]     Blood Alcohol level:  Lab Results  Component Value Date   [facility] <10 93/90/3009    Metabolic Disorder Labs: No results found for: HGBA1C, MPG No results found for: PROLACTIN Lab Results  Component Value Date   CHOL 229 (H) 01/15/2020   TRIG 85 01/15/2020   HDL 70 01/15/2020   CHOLHDL 3.3 01/15/2020   VLDL 17 01/15/2020   LDLCALC 142 (H) 01/15/2020   LDLCALC 103 (H)  02/19/2017    Physical Findings: AIMS:  , ,  ,  ,    CIWA:    COWS:  COWS Total Score: 2  Musculoskeletal: Strength & Muscle Tone: within normal limits Gait & Station: normal Patient leans: N/A  Psychiatric Specialty Exam: Physical Exam  Review of Systems  Blood pressure (!) 132/76, pulse 94, temperature 98.4 F (36.9 C), temperature source Oral, resp. rate 18, height 5\' 5"  (1.651 m), weight 49 kg, SpO2 100 %.Body mass index is 17.97 kg/m.  General Appearance: Casual  Eye Contact:  Good  Speech:  Normal Rate  Volume:  Normal  Mood:  Anxious  Affect:  Appropriate and Congruent  Thought Process:  Coherent, Goal Directed and Linear  Orientation:  Full (Time, Place, and Person)  Thought Content:  Logical  Suicidal Thoughts:  No  Homicidal Thoughts:  No  Memory:  Immediate;   Good Recent;   Good Remote;   Good  Judgement:  Fair  Insight:  Fair  Psychomotor Activity:  Normal  Concentration:  Concentration: Good and Attention Span: Good  Recall:  Good  Fund of Knowledge:  Fair  Language:  Good  Akathisia:  No  Handed:  Right  AIMS (if indicated):     Assets:  Communication Skills Desire for Improvement Housing Physical Health Social Support  ADL's:  Intact  Cognition:  WNL  Sleep:  Number of Hours: 7     Treatment Plan Summary: Daily contact with patient to assess and evaluate symptoms and progress in treatment and Medication management   Patient is a 64 year old female with the above-stated past psychiatric history who is seen in follow-up.  Chart reviewed. Patient discussed with nursing. Patient continue to deny intentional overdose and suicidal thoughts/plans. She denies feeling depressed and does not believe she needs any additional medications to her Klonopin she takes for anxiety for years. She is agreeable to be referred to a psychiatrist and thinks she might benefit from therapy. She denies any current mental complaints and asks about discharge. She gave me  permission to contact her husband for collateral information. Patient`s husband reports he had never had any concerns that his wife is depressed or suicidal. He does not believe she wanted to kill self that time. She is overwhelmed with current home responsibilities and her own health. She never expresses suicidal thoughts, plans. She would benefit  from therapy to discuss her issues with a professional. There is no concern for substance use. He feels she can be safely discharged home when ready and he and their son will monitor her mood and behavior.   Will observe patient`s mood and behavior in the unit and will decide if she is ready for discharge tomorrow. SW to work on outpatient [place] referral.  Plan: -continue inpatient psych admission; 15-minute checks; daily contact with patient to assess and evaluate symptoms and progress in treatment; psychoeducation.  -continue scheduled psych medications: . calcium-vitamin D  1 tablet Oral Q breakfast  . clonazePAM  1 mg Oral TID  . feeding supplement (ENSURE ENLIVE)  237 mL Oral BID BM  . multivitamin with minerals  1 tablet Oral Daily  . naproxen  250 mg Oral TID WC  . pantoprazole  40 mg Oral Daily   -continue PRN medications. acetaminophen, alum & mag hydroxide-simeth, dicyclomine, hydrOXYzine, loperamide, magnesium hydroxide, menthol-cetylpyridinium, methocarbamol, ondansetron, traZODone   -Disposition: to be determined. Likely d/c home with outpatient psych follow-up, possibly tomorrow.  Larita Fife, MD 01/15/2020, 12:38 PM

## 2020-01-15 NOTE — B[facility] Group Notes (Signed)
LCSW Group Therapy Note  01/15/2020   10:00-11:00am   Type of Therapy and Topic:  Group Therapy: Anger Cues and Responses  Participation Level:  Active   Description of Group:   In this group, patients learned how to recognize the physical, cognitive, emotional, and behavioral responses they have to anger-provoking situations.  They identified a recent time they became angry and how they reacted.  They analyzed how their reaction was possibly beneficial and how it was possibly unhelpful.  The group discussed a variety of healthier coping skills that could help with such a situation in the future.  Focus was placed on how helpful it is to recognize the underlying emotions to our anger, because working on those can lead to a more permanent solution as well as our ability to focus on the important rather than the urgent.  Therapeutic Goals: 1. Patients will remember their last incident of anger and how they felt emotionally and physically, what their thoughts were at the time, and how they behaved. 2. Patients will identify how their behavior at that time worked for them, as well as how it worked against them. 3. Patients will explore possible new behaviors to use in future anger situations. 4. Patients will learn that anger itself is normal and cannot be eliminated, and that healthier reactions can assist with resolving conflict rather than worsening situations.  Summary of Patient Progress:  The patient denied experiencing anger to any large degree and said she is grateful to have her husband and son to return home to.  Therapeutic Modalities:   Cognitive Behavioral Therapy  Rolanda Jay

## 2020-01-15 NOTE — Plan of Care (Signed)
D: Pt alert and oriented x 4. Pt rates depression 0/10, hopelessness 0/10, and anxiety 0/10.Pt goal: "going home." Pt reports energy level as normal and concentration as being good. Pt reports sleep last night as being fair. Pt did not receive medications for sleep. Pt reports experiencing 4/10 arthritic hand pain in right hand, scheduled med given and found helpful. Pt denies experiencing any SI/HI, or AVH at this time. Pt during treatment team had great difficulty to focus on self and self improvement. Pt speak about everything except herself, her needs, and direct questions being asked.  A: Scheduled medications administered to pt, per MD orders. Support and encouragement provided. Frequent verbal contact made. Routine safety checks conducted q15 minutes.   R: No adverse drug reactions noted. Pt verbally contracts for safety at this time. Pt complaint with medications and treatment plan. Pt interacts well with others on the unit. Pt remains safe at this time. Will continue to monitor.   Problem: Education: Goal: Ability to state activities that reduce stress will improve Outcome: Not Progressing   Problem: Coping: Goal: Ability to identify and develop effective coping behavior will improve Outcome: Not Progressing   Problem: Self-Concept: Goal: Ability to identify factors that promote anxiety will improve Outcome: Not Progressing Goal: Level of anxiety will decrease Outcome: Not Progressing Goal: Ability to modify response to factors that promote anxiety will improve Outcome: Not Progressing   Problem: Education: Goal: Knowledge of General Education information will improve Description: Including pain rating scale, medication(s)/side effects and non-pharmacologic comfort measures Outcome: Not Progressing   Problem: Health Behavior/Discharge Planning: Goal: Ability to manage health-related needs will improve Outcome: Not Progressing   Problem: Clinical Measurements: Goal: Ability to  maintain clinical measurements within normal limits will improve Outcome: Not Progressing Goal: Will remain free from infection Outcome: Not Progressing Goal: Diagnostic test results will improve Outcome: Not Progressing Goal: Respiratory complications will improve Outcome: Not Progressing Goal: Cardiovascular complication will be avoided Outcome: Not Progressing   Problem: Activity: Goal: Risk for activity intolerance will decrease Outcome: Not Progressing   Problem: Nutrition: Goal: Adequate nutrition will be maintained Outcome: Not Progressing   Problem: Coping: Goal: Level of anxiety will decrease Outcome: Not Progressing   Problem: Elimination: Goal: Will not experience complications related to bowel motility Outcome: Not Progressing Goal: Will not experience complications related to urinary retention Outcome: Not Progressing   Problem: Pain Managment: Goal: General experience of comfort will improve Outcome: Not Progressing   Problem: Safety: Goal: Ability to remain free from injury will improve Outcome: Not Progressing   Problem: Skin Integrity: Goal: Risk for impaired skin integrity will decrease Outcome: Not Progressing

## 2020-01-15 NOTE — BHH Counselor (Signed)
Adult Comprehensive Assessment  Patient ID: Allison Dickerson, female   DOB: 06-20-1955, 64 y.o.   MRN: [id number]  Information Source: Information source: Patient  Current Stressors:  Patient states their primary concerns and needs for treatment are:: Go back to my clonopin only. Patient states their goals for this hospitilization and ongoing recovery are:: Get some rest Educational / Learning stressors: no Employment / Job issues: no Family Relationships: no but patient is the primary caretaker for her 30 year old mother Museum/gallery curator / Lack of resources (include bankruptcy): no Housing / Lack of housing: no Physical health (include injuries & life threatening diseases): digestive concerns Social relationships: no Substance abuse: no Bereavement / Loss: no  Living/Environment/Situation:  Living Arrangements: Spouse/significant other, Children, Other relatives Living conditions (as described by patient or guardian): great Who else lives in the home?: Mother, son, and husband How long has patient lived in current situation?: one year with the mother in the home What is atmosphere in current home: Comfortable, Quarry manager, Supportive  Family History:  Marital status: Married Number of Years Married: 52 What types of issues is patient dealing with in the relationship?: no issues happily married Are you sexually active?: Yes What is your sexual orientation?: straight Has your sexual activity been affected by drugs, alcohol, medication, or emotional stress?: no Does patient have children?: Yes How many children?: 1 How is patient's relationship with their children?: Wonderful relationship  Childhood History:  Additional childhood history information: Father was a Environmental education officer Description of patient's relationship with caregiver when they were a child: wonderful relationship with parents Patient's description of current relationship with people who raised him/her: Father is deceased but mother is  still alive and lives with patient and her husband How were you disciplined when you got in trouble as a child/adolescent?: switched lightly Does patient have siblings?: Yes Number of Siblings: 2 Description of patient's current relationship with siblings: Brother is a missionary who lives in [place] and her sister died several years ago. Did patient suffer any verbal/emotional/physical/sexual abuse as a child?: No Did patient suffer from severe childhood neglect?: No Has patient ever been sexually abused/assaulted/raped as an adolescent or adult?: No Was the patient ever a victim of a crime or a disaster?: No Witnessed domestic violence?: No Has patient been affected by domestic violence as an adult?: No  Education:  Highest grade of school patient has completed: GED from [place] and enrichment classes Currently a student?: No Learning disability?: No  Employment/Work Situation:   Employment situation: Unemployed (Takes care of mother and the house) Patient's job has been impacted by current illness: No What is the longest time patient has a held a job?: 10 years 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 Has patient ever been in the [employer]?: No  Financial Resources:   Financial resources: Income from spouse Does patient have a representative payee or guardian?: No  Alcohol/Substance Abuse:   What has been your use of drugs/alcohol within the last 12 months?: occasional glass of wine If attempted suicide, did drugs/alcohol play a role in this?: No Alcohol/Substance Abuse Treatment Hx: Denies past history Has alcohol/substance abuse ever caused legal problems?: Yes  Social Support System:   Patient's Community Support System: Good Describe Community Support System: Husband, son Type of faith/religion: Baptist How does patient's faith help to cope with current illness?: prayer  Leisure/Recreation:   Do You Have Hobbies?: Yes Leisure and Hobbies:  gardening, sports  Strengths/Needs:   What is the patient's  perception of their strengths?: toughness, love people, great social skills Patient states they can use these personal strengths during their treatment to contribute to their recovery: knowing that coming home means I am helping someone Patient states these barriers may affect/interfere with their treatment: no Patient states these barriers may affect their return to the community: no Other important information patient would like considered in planning for their treatment: none  Discharge Plan:   Currently receiving community mental health services: No Patient states concerns and preferences for aftercare planning are: preference for mental health services be virtual Patient states they will know when they are safe and ready for discharge when: ready right Does patient have access to transportation?: Yes Does patient have financial barriers related to discharge medications?: No Will patient be returning to same living situation after discharge?: Yes  Summary/Recommendations:   Summary and Recommendations (to be completed by the evaluator): Patient is a 64 year old female with a longstanding history of generalized anxiety who presented to the [facility] emergency department on 01/13/2020 after being found by her husband unconscious.  The patient was reportedly found unresponsive next to the bed, and the husband called EMS.  The patient stated that the reason she had taken the excessive amount of tramadol was she was in pain.  She stated that since she had started the tramadol she felt as though her behavior change.     She denied any history of any psychiatric evaluations.  She denied any previous suicide attempts.   She denied any previous psychiatric admissions.  She stated that she has been on benzodiazepines for many years.     Review of the PMP database did not reveal any prescriptions for tramadol or Klonopin.  Her drug screen on  admission was negative for opiates or benzodiazepines, but did have marijuana present.  She stated that she did not use marijuana, but had gotten some "edibles" recently and she thought they were only CBD based.  She denied any alcohol, tobacco smoking, marijuana use or other illicit substances.  She denied any family history of psychiatric illness as well suicide attempts.     Patient will benefit from crisis stabilization, medication evaluation, group therapy and psychoeducation, in addition to case management for discharge planning. At discharge it is recommended that Patient adhere to the established discharge plan and continue in treatment.  Anticipated outcomes: Mood will be stabilized, crisis will be stabilized, medications will be established if appropriate, coping skills will be taught and practiced, family session will be done to determine discharge plan, mental illness will be normalized, patient will be better equipped to recognize symptoms and ask for assistance.   Rolanda Jay. 01/15/2020

## 2020-01-16 NOTE — Discharge Summary (Signed)
Physician Discharge Summary Note  Patient:  Allison Dickerson is an 64 y.o., female MRN:  [id number] DOB:  1955-08-24 Patient phone:  [phone] (home)  Patient address:   [place] [address],  Total Time spent with patient: 30 minutes  Date of Admission:  01/14/2020 Date of Discharge: 01/16/20  Reason for Admission:  Overdose on medication  Principal Problem: <principal problem not specified> Discharge Diagnoses: Active Problems:   Major depression  Allison Dickerson is a 64 y.o. female that has a previous psychiatric history of anxiety who presents to the [facility] unit for treatment of depression in the context of overdose on Tramadol.   Past Psychiatric History:  Patient stated that she has been treated with benzodiazepines for anxiety for many years.  She stated she had been on Xanax, lorazepam or Klonopin for at least 16 to 20 years.  She denied any previous psychiatric evaluations, she denied any other psychiatric medications, she denied any previous psychiatric admissions.  Past Medical History:  Past Medical History:  Diagnosis Date  . Anxiety   . Cervical cancer ([place])    When younger.  . Diverticulitis   . Pinched nerve    right hand    Past Surgical History:  Procedure Laterality Date  . ABDOMINAL HYSTERECTOMY    . cervical cancer removal     Family History:  Family History  Problem Relation Age of Onset  . Cancer Father   . Cancer Other    Family Psychiatric  History: unremarkable  Social History:  Social History   Substance and Sexual Activity  Alcohol Use No     Social History   Substance and Sexual Activity  Drug Use Yes  . Types: Marijuana   Comment: occ    Social History   Socioeconomic History  . Marital status: Married    Spouse name: Not on file  . Number of children: Not on file  . Years of education: Not on file  . Highest education level: Not on file  Occupational History  . Not on file  Tobacco Use  . Smoking status:  Former Smoker    Packs/day: 0.50    Years: 20.00    Pack years: 10.00    Types: Cigarettes  . Smokeless tobacco: Never Used  Substance and Sexual Activity  . Alcohol use: No  . Drug use: Yes    Types: Marijuana    Comment: occ  . Sexual activity: Not on file  Other Topics Concern  . Not on file  Social History Narrative  . Not on file   Social Determinants of Health   Financial Resource Strain:   . Difficulty of Paying Living Expenses:   Food Insecurity:   . Worried About Charity fundraiser in the Last Year:   . Arboriculturist in the Last Year:   Transportation Needs:   . Film/video editor (Medical):   Marland Kitchen Lack of Transportation (Non-Medical):   Physical Activity:   . Days of Exercise per Week:   . Minutes of Exercise per Session:   Stress:   . Feeling of Stress :   Social Connections:   . Frequency of Communication with Friends and Family:   . Frequency of Social Gatherings with Friends and Family:   . Attends Religious Services:   . Active Member of Clubs or Organizations:   . Attends Archivist Meetings:   Marland Kitchen Marital Status:     Hospital Course:   Allison Dickerson is  a 64 y.o. female that has a previous psychiatric history of anxiety who presents to the [facility] unit for treatment of depression in the context of overdose on Tramadol.   The patient was admitted to Adult Psychiatry under the care of Dr. Mallie Darting on a involuntary basis. She was restricted to ward and placed on suicide precautions. The plan at the time of admission was safety, stabilization and treatment. On assessment in the unit adamantly denied she tried to kill self. She reports there are a lot on her plate at home, such as taking care of elderly mother and dealing with her own medical issues, being in constant pain due to arthritis. She reports she "got overwhelmed" and took more medications to help self "feeling better", not to kill self. She reports feeling deeply shocked that she ended up in the ER  and in the [facility] unit now. She reports feeling "happy and thankful" to be alive. She reports that her father was a priest and that she is a woman of faith and that she would never even think of killing herself. She denies any previous suicide attempts. She denied any previous overdoses. She denied any previous psychiatric admissions. She stated that she has been on benzodiazepines for many years for anxiety. She denies being treated for depression and does not think she is depressed currently.She stated she gets her prescriptions from PCP and filled at [facility]. She was agreeable to be referred to a psychiatrist and thinks she might benefit from therapy.  Collateral information obtained from patients husband, Allison Dickerson ([phone]): they are married for about 58 years and he never had any concern that his wife is depressed or suicidal. He does not believe she wanted to kill self that time. She is overwhelmed with current home responsibilities and her own health. She never expresses suicidal thoughts, plans. She would benefit from therapy to discuss her issues with a professional. There is no concern for substance use. He feels she can be safely discharged home when ready and he and their son will monitor her mood and behavior.   For the management of her anxiety disorder, patient was continued on home medication of Klonopin 1mg  PO TID. She never required as needed medications for agitation or psychosis. She participated in groups and socialized with few peers. She never required seclusion or restraints.  On the day of discharge 01/16/20, the patient was considered an acute LOW risk of self harm despite recent overdose. Patients current age represents non-modifiable/baseline risk factors. Presence of mental disorder (anxiety) is dynamic risk factor. The patient denies suicidal thoughts, denies a history of intentional suicidal attempts, denies access to firearm. Patient is future-oriented, has  responsibilities, help-seeking, has access to mental health care, has family and community support, has cultural/religious beliefs that discourage suicide - all protective factors. Therefore, represents a low risk for harming self acutely and elevated chronic risk due to non-modifiable risk factors.   Patient and her family is given outpatient [place] resources in her residential area by our SW.  [facility] RISK ASSESSMENT: Suicide Risk Assessment: prior suicidal attempts (especially in last year), age, female gender, Caucasian, Native-American ethnicity, family history of suicide, h/o trauma/abuse, incarceration, suicide in friend/community - represent non-modifiable/baseline risk factors. Presence of mental disorder, alcohol/substance use, hopelessness, impulsivity, living alone, recent loss, debilitating medical illness,   access to lethal methods/firearms, unwillingness to seek help are dynamic risk factors. The patient denies suicidal thoughts, denies a history of suicidal attempts, denies access to firearm. Patient is future-oriented,  has responsibilities, help-seeking, has access to mental health care, has family and community support, has cultural/religious beliefs that discourage suicide - all protective factors. Therefore, represents a low risk for harming self acutely and chronically / elevated chronic risk due to non-modifiable risk factors.  Violence Risk Assessment: pt does not disclose a history of violence. The patient was neither angry nor agitated in our interview. Therefore, the patient represents a low acute and chronic risk for violence.   [place] Suicide Severity Rating Scale  Wish to be dead: No  Suicidal thoughts: No                  Suicidal thoughts with method: No                  Suicidal intent: No                  Suicide intent with specific plan: No                  Suicide behavior: No  Musculoskeletal: Strength & Muscle Tone: within normal limits Gait & Station:  normal Patient leans: N/A  Psychiatric Specialty Exam: Physical Exam   Review of Systems   Blood pressure 109/79, pulse 101, temperature (!) 97.4 F (36.3 C), temperature source Oral, resp. rate 18, height 5\' 5"  (1.651 m), weight 49 kg, SpO2 100 %.Body mass index is 17.97 kg/m.  General Appearance: Casual  Eye Contact:  Good  Speech:  Normal Rate  Volume:  Normal  Mood:  Anxious  Affect:  Appropriate and Congruent  Thought Process:  Coherent, Goal Directed and Linear  Orientation:  Full (Time, Place, and Person)  Thought Content:  Logical  Suicidal Thoughts:  No  Homicidal Thoughts:  No  Memory:  Immediate;   Good Recent;   Good Remote;   Good  Judgement:  Fair  Insight:  Fair  Psychomotor Activity:  Normal  Concentration:  Concentration: Good and Attention Span: Good  Recall:  Good  Fund of Knowledge:  Fair  Language:  Good  Akathisia:  No  Handed:  Right  AIMS (if indicated):     Assets:  Communication Skills Desire for Improvement Housing Physical Health Social Support  ADL's:  Intact  Cognition:  WNL  Sleep:  Number of Hours: 7.15        Has this patient used any form of tobacco in the last 30 days? (Cigarettes, Smokeless Tobacco, Cigars, and/or Pipes) Yes, N/A  Blood Alcohol level:  Lab Results  Component Value Date   ETH <10 40/98/1191    Metabolic Disorder Labs:  Lab Results  Component Value Date   HGBA1C 5.7 (H) 01/15/2020   MPG 116.89 01/15/2020   No results found for: PROLACTIN Lab Results  Component Value Date   CHOL 229 (H) 01/15/2020   TRIG 85 01/15/2020   HDL 70 01/15/2020   CHOLHDL 3.3 01/15/2020   VLDL 17 01/15/2020   LDLCALC 142 (H) 01/15/2020   LDLCALC 103 (H) 02/19/2017    See Psychiatric Specialty Exam and Suicide Risk Assessment completed by Attending Physician prior to discharge.  Discharge destination:  Home  Is patient on multiple antipsychotic therapies at discharge:  No   Has Patient had three or more failed  trials of antipsychotic monotherapy by history:  No  Recommended Plan for Multiple Antipsychotic Therapies: NA   Allergies as of 01/16/2020      Reactions   Gluten Meal Diarrhea      Medication List  STOP taking these medications   traMADol 50 MG tablet Commonly known as: ULTRAM     TAKE these medications     Indication  albuterol 108 (90 Base) MCG/ACT inhaler Commonly known as: VENTOLIN HFA Inhale 1-2 puffs into the lungs every 6 (six) hours as needed for wheezing or shortness of breath.  Indication: Asthma   clonazePAM 1 MG tablet Commonly known as: KlonoPIN Take 1 tablet (1 mg total) by mouth 3 (three) times daily as needed for anxiety. What changed: when to take this  Indication: Feeling Anxious   dicyclomine 20 MG tablet Commonly known as: BENTYL Take 1 tablet (20 mg total) by mouth 2 (two) times daily.  Indication: Irritable Bowel Syndrome   naproxen sodium 220 MG tablet Commonly known as: ALEVE Take 220 mg by mouth daily as needed (FOR PAIN).  Indication: Pain        Follow-up recommendations:  Other:  outpatient MH follow/up.    Signed: Larita Fife, MD 01/16/2020, 10:31 AM

## 2020-01-16 NOTE — Progress Notes (Signed)
Patient has been pleasant and cooperative. Talkative. Denies SI, HI and AVH. Complained of insomnia. Given Trazodone per prn order and complained that it kept her awake instead of helping her sleep.

## 2020-01-16 NOTE — BHH Suicide Risk Assessment (Signed)
[place] INPATIENT:  Family/Significant Other Suicide Prevention Education  Suicide Prevention Education:  Education Completed; Allen Basista,  (Husband) has been identified by the patient as the family member/significant other with whom the patient will be residing, and identified as the person(s) who will aid the patient in the event of a mental health crisis (suicidal ideations/suicide attempt).  With written consent from the patient, the family member/significant other has been provided the following suicide prevention education, prior to the and/or following the discharge of the patient.  The suicide prevention education provided includes the following:  Suicide risk factors  Suicide prevention and interventions  National Suicide Hotline telephone number  [facility] assessment telephone number  [facility] Emergency Assistance [place] and/or Residential Mobile Crisis Unit telephone number  Request made of family/significant other to:  Remove weapons (e.g., guns, rifles, knives), all items previously/currently identified as safety concern.    Remove drugs/medications (over-the-counter, prescriptions, illicit drugs), all items previously/currently identified as a safety concern.  The family member/significant other verbalizes understanding of the suicide prevention education information provided.  The family member/significant other agrees to remove the items of safety concern listed above.  CSW recommended locking patients medication in a lock box and he stated he would consider doing that. Mr. Kaman stated his wife made a mistake and does not believe this will be a problem in the future.   Larene [place]  Saniyah Mondesir 01/16/2020, 12:28 PM

## 2020-01-16 NOTE — Progress Notes (Signed)
Patient denies SI/HI, denies A/V hallucinations. Patient verbalizes understanding of discharge instructions, follow up care and prescriptions.Patient stated that she will [facility] tomorrow for follow up appointment. Patient given all belongings from [facility] locker. Patient escorted out by staff, transported by cab.

## 2020-01-16 NOTE — Progress Notes (Signed)
  [facility] Adult Case Management Discharge Plan :  Will you be returning to the same living situation after discharge:  Yes,  Home with husband and family. At discharge, do you have transportation home?: Yes,  [employer]. Do you have the ability to pay for your medications: Yes,  pt has coverage via [employer].  Release of information consent forms completed and turned in to Medical Records by CSW.  Patient to Follow up at:  Follow-up Information    Services, [facility]. Call in 1 day(s).   Why: Call tomorrow, 01/17/20, to schedule a behavioral health hospital discharge appointment for outpatient therapy and medication management. Discuss the availability of virtual appointments per patient preference. Contact information: [place] [address] [phone]               Next level of care provider has access to [place] and Suicide Prevention discussed: Yes,  CSW reviewed safety measures with husband, Allison Dickerson.  Has patient been referred to the Quitline?: Patient refused referral  Patient has been referred for addiction treatment: Yes; Pt has been provided information for [place] and will schedule follow up appointment.  Blane Ohara, LCSW 01/16/2020, 12:11 PM

## 2020-01-16 NOTE — BHH Suicide Risk Assessment (Signed)
[place] INPATIENT:  Family/Significant Other Suicide Prevention Education  Suicide Prevention Education:  Contact Attempts: Lailany Enoch, Husband, [phone], (name of family member/significant other) has been identified by the patient as the family member/significant other with whom the patient will be residing, and identified as the person(s) who will aid the patient in the event of a mental health crisis.  With written consent from the patient, two attempts were made to provide suicide prevention education, prior to and/or following the patient's discharge.  We were unsuccessful in providing suicide prevention education.  A suicide education pamphlet was given to the patient to share with family/significant other.  Date and time of first attempt: 01/16/20 1030. CSW left HIPPA compliant voicemail. CSW will make additional efforts at a later time.   Blane Ohara 01/16/2020, 10:37 AM

## 2020-01-16 NOTE — Plan of Care (Signed)

## 2020-01-16 NOTE — Progress Notes (Signed)
[facility] MD Progress Note  01/16/2020 9:20 AM Allison Dickerson  MRN:  [id number]  Principal Problem: <principal problem not specified> Diagnosis: Active Problems:   Major depression  [name] is a 64 y.o. female that has a previous psychiatric history of anxiety who presents to the [facility] unit for treatment of depression in the context of overdose on Tramadol.   Interval History Patient was seen today with a whole team for re-evaluation.  Nursing reports no events overnight. The patient reports no issues with performing ADLs.  Patient has been medication compliant.  The patient reports no side effects from medications.  Current symptoms being addressed include:  Depression, anxiety.  Since last assessment, patient reports symptoms have improved greatly. Per RN: "Patient has been pleasant and cooperative. Talkative. Denies SI, HI and AVH."  SUBJECTIVE: On assessment patient reports "feeling good, homesick, worry about my 14yo mother and how my husband is dealing with everything at home without me". She denies feeling depressed. Her anxiety related to situation at home while she is here in the hospital. She adamantly denies suicidal ideas, plans. He lists her family (husband, son, mother, dogs) and her religion as protective factors against suicide. He is oriented for future. She is agreeable to be referred to a psychiatrist and thinks she might benefit from therapy. She denies any current mental or physical complaints and asks about discharge.    Current suicidal/homicidal ideations: Denies Current auditory/visual hallucinations: Denies  Review Of Systems: A complete review of systems of the following systems was conducted (Constitutional, Psychiatric, Neurological, Musculoskeletal, Eyes, Gastrointestinal, Cardiovascular, Respiratory, Skin, and Endocrine). All reviewed systems are negative except pertinent positives identified in the HPI.  Labs: no new labs for review.   Total Time spent with  patient: 30 minutes  Past Psychiatric History: see H&P  Past Medical History:  Past Medical History:  Diagnosis Date   Anxiety    Cervical cancer ([place])    When younger.   Diverticulitis    Pinched nerve    right hand    Past Surgical History:  Procedure Laterality Date   ABDOMINAL HYSTERECTOMY     cervical cancer removal     Family History:  Family History  Problem Relation Age of Onset   Cancer Father    Cancer Other    Family Psychiatric  History: see H&P  Social History:  Social History   Substance and Sexual Activity  Alcohol Use No     Social History   Substance and Sexual Activity  Drug Use Yes   Types: Marijuana   Comment: occ    Social History   Socioeconomic History   Marital status: Married    Spouse name: Not on file   Number of children: Not on file   Years of education: Not on file   Highest education level: Not on file  Occupational History   Not on file  Tobacco Use   Smoking status: Former Smoker    Packs/day: 0.50    Years: 20.00    Pack years: 10.00    Types: Cigarettes   Smokeless tobacco: Never Used  Substance and Sexual Activity   Alcohol use: No   Drug use: Yes    Types: Marijuana    Comment: occ   Sexual activity: Not on file  Other Topics Concern   Not on file  Social History Narrative   Not on file   Social Determinants of Health   Financial Resource Strain:    Difficulty of Paying Living Expenses:  Food Insecurity:    Worried About Charity fundraiser in the Last Year:    Arboriculturist in the Last Year:   Transportation Needs:    Film/video editor (Medical):    Lack of Transportation (Non-Medical):   Physical Activity:    Days of Exercise per Week:    Minutes of Exercise per Session:   Stress:    Feeling of Stress :   Social Connections:    Frequency of Communication with Friends and Family:    Frequency of Social Gatherings with Friends and Family:    Attends  Religious Services:    Active Member of Clubs or Organizations:    Attends Archivist Meetings:    Marital Status:    Additional Social History:                         Sleep: Good  Appetite:  Good  Current Medications: Current Facility-Administered Medications  Medication Dose Route Frequency Provider Last Rate Last Admin   acetaminophen (TYLENOL) tablet 650 mg  650 mg Oral Q6H PRN Sharma Covert, MD       alum & mag hydroxide-simeth (MAALOX/MYLANTA) 200-200-20 MG/5ML suspension 30 mL  30 mL Oral Q4H PRN Sharma Covert, MD       calcium-vitamin D (OSCAL WITH D) 500-200 MG-UNIT per tablet 1 tablet  1 tablet Oral Q breakfast Sharma Covert, MD   1 tablet at 01/16/20 3382   clonazePAM (KLONOPIN) tablet 1 mg  1 mg Oral TID Sharma Covert, MD   1 mg at 01/16/20 0805   dicyclomine (BENTYL) tablet 20 mg  20 mg Oral Q6H PRN Sharma Covert, MD   20 mg at 01/16/20 5053   feeding supplement (ENSURE ENLIVE) (ENSURE ENLIVE) liquid 237 mL  237 mL Oral BID BM Sharma Covert, MD   237 mL at 01/15/20 1331   hydrOXYzine (ATARAX/VISTARIL) tablet 25 mg  25 mg Oral TID PRN Sharma Covert, MD       loperamide (IMODIUM) capsule 2-4 mg  2-4 mg Oral PRN Sharma Covert, MD       magnesium hydroxide (MILK OF MAGNESIA) suspension 30 mL  30 mL Oral Daily PRN Sharma Covert, MD       menthol-cetylpyridinium (CEPACOL) lozenge 3 mg  1 lozenge Oral PRN Deloria Lair, NP       methocarbamol (ROBAXIN) tablet 500 mg  500 mg Oral Q8H PRN Sharma Covert, MD       multivitamin with minerals tablet 1 tablet  1 tablet Oral Daily Sharma Covert, MD   1 tablet at 01/16/20 0805   naproxen (NAPROSYN) tablet 250 mg  250 mg Oral TID WC Sharma Covert, MD   250 mg at 01/16/20 0805   ondansetron (ZOFRAN-ODT) disintegrating tablet 4 mg  4 mg Oral Q6H PRN Sharma Covert, MD       pantoprazole (PROTONIX) EC tablet 40 mg  40 mg Oral Daily Sharma Covert, MD   40 mg at 01/15/20 0802   traZODone (DESYREL) tablet 50 mg  50 mg Oral QHS PRN Sharma Covert, MD   50 mg at 01/15/20 2218    Lab Results:  Results for orders placed or performed during the hospital encounter of 01/14/20 (from the past 48 hour(s))  TSH     Status: None   Collection Time: 01/15/20  6:27 AM  Result Value Ref  Range   TSH 1.022 0.350 - 4.500 uIU/mL    Comment: Performed by a 3rd Generation assay with a functional sensitivity of <=0.01 uIU/mL. Performed at [facility], [address] [place], [place] [address]   CBC with Differential/Platelet     Status: Abnormal   Collection Time: 01/15/20  6:27 AM  Result Value Ref Range   WBC 9.0 4.0 - 10.5 K/uL   RBC 4.37 3.87 - 5.11 MIL/uL   Hemoglobin 13.4 12.0 - 15.0 g/dL   HCT 40.4 36 - 46 %   MCV 92.4 80.0 - 100.0 fL   MCH 30.7 26.0 - 34.0 pg   MCHC 33.2 30.0 - 36.0 g/dL   RDW 12.2 11.5 - 15.5 %   Platelets 307 150 - 400 K/uL   nRBC 0.0 0.0 - 0.2 %   Neutrophils Relative % 61 %   Neutro Abs 5.5 1.7 - 7.7 K/uL   Lymphocytes Relative 23 %   Lymphs Abs 2.0 0.7 - 4.0 K/uL   Monocytes Relative 8 %   Monocytes Absolute 0.7 0 - 1 K/uL   Eosinophils Relative 7 %   Eosinophils Absolute 0.6 (H) 0 - 0 K/uL   Basophils Relative 1 %   Basophils Absolute 0.1 0 - 0 K/uL   Immature Granulocytes 0 %   Abs Immature Granulocytes 0.03 0.00 - 0.07 K/uL    Comment: Performed at [facility], [address] [place], [place] [address]  Hemoglobin A1c     Status: Abnormal   Collection Time: 01/15/20  6:27 AM  Result Value Ref Range   Hgb A1c MFr Bld 5.7 (H) 4.8 - 5.6 %    Comment: (NOTE) Pre diabetes:          5.7%-6.4%  Diabetes:              >6.4%  Glycemic control for   <7.0% adults with diabetes    Mean Plasma Glucose 116.89 mg/dL    Comment: Performed at [facility], [place] [address] [place], [place] [address]  Lipid panel     Status: Abnormal   Collection Time: 01/15/20  6:27  AM  Result Value Ref Range   Cholesterol 229 (H) 0 - 200 mg/dL   Triglycerides 85 <150 mg/dL   HDL 70 >40 mg/dL   Total CHOL/HDL Ratio 3.3 RATIO   VLDL 17 0 - 40 mg/dL   LDL Cholesterol 142 (H) 0 - 99 mg/dL    Comment:        Total Cholesterol/HDL:CHD Risk Coronary Heart Disease Risk Table                     Men   Women  1/2 Average Risk   3.4   3.3  Average Risk       5.0   4.4  2 X Average Risk   9.6   7.1  3 X Average Risk  23.4   11.0        Use the calculated Patient Ratio above and the CHD Risk Table to determine the patient's CHD Risk.        ATP III CLASSIFICATION (LDL):  <100     mg/dL   Optimal  100-129  mg/dL   Near or Above                    Optimal  130-159  mg/dL   Borderline  160-189  mg/dL   High  >190  mg/dL   Very High Performed at [facility], [address] [place],  [address]     Blood Alcohol level:  Lab Results  Component Value Date   [facility] <10 38/75/6433    Metabolic Disorder Labs: Lab Results  Component Value Date   HGBA1C 5.7 (H) 01/15/2020   MPG 116.89 01/15/2020   No results found for: PROLACTIN Lab Results  Component Value Date   CHOL 229 (H) 01/15/2020   TRIG 85 01/15/2020   HDL 70 01/15/2020   CHOLHDL 3.3 01/15/2020   VLDL 17 01/15/2020   LDLCALC 142 (H) 01/15/2020   LDLCALC 103 (H) 02/19/2017    Physical Findings: AIMS:  , ,  ,  ,    CIWA:    COWS:  COWS Total Score: 2  Musculoskeletal: Strength & Muscle Tone: within normal limits Gait & Station: normal Patient leans: N/A  Psychiatric Specialty Exam: Physical Exam   Review of Systems   Blood pressure 109/79, pulse 101, temperature (!) 97.4 F (36.3 C), temperature source Oral, resp. rate 18, height 5\' 5"  (1.651 m), weight 49 kg, SpO2 100 %.Body mass index is 17.97 kg/m.  General Appearance: Casual  Eye Contact:  Good  Speech:  Normal Rate  Volume:  Normal  Mood:  Anxious  Affect:  Appropriate and Congruent  Thought Process:  Coherent,  Goal Directed and Linear  Orientation:  Full (Time, Place, and Person)  Thought Content:  Logical  Suicidal Thoughts:  No  Homicidal Thoughts:  No  Memory:  Immediate;   Good Recent;   Good Remote;   Good  Judgement:  Fair  Insight:  Fair  Psychomotor Activity:  Normal  Concentration:  Concentration: Good and Attention Span: Good  Recall:  Good  Fund of Knowledge:  Fair  Language:  Good  Akathisia:  No  Handed:  Right  AIMS (if indicated):     Assets:  Communication Skills Desire for Improvement Housing Physical Health Social Support  ADL's:  Intact  Cognition:  WNL  Sleep:  Number of Hours: 7.15     Treatment Plan Summary: Daily contact with patient to assess and evaluate symptoms and progress in treatment and Medication management   Patient is a 64 year old female with the above-stated past psychiatric history who is seen in follow-up.  Chart reviewed. Patient discussed with nursing. Patient denies feeling depressed and does not believe she needs any additional medications to her Klonopin she takes for anxiety for years. She is agreeable to be referred to a psychiatrist and thinks she might benefit from therapy. She denies any current mental complaints and asks about discharge. Her husband was contacted by me yesterday for collateral information and stated he feels she can be safely discharged home when ready and he and their son will monitor her mood and behavior. SW to work on outpatient [place] referral. Patient likely will be discharged later today.  Suicide Risk Assessment: prior suicidal attempt (unintentional), age, Caucasian - represent non-modifiable/baseline risk factors. Presence of mental disorder (anxiety) is dynamic risk factor. The patient denies suicidal thoughts, denies a history of intentional suicidal attempts, denies access to firearm. Patient is future-oriented, has responsibilities, help-seeking, has access to mental health care, has family and community support,  has cultural/religious beliefs that discourage suicide - all protective factors. Therefore, represents a low risk for harming self acutely and elevated chronic risk due to non-modifiable risk factors.   Plan: -continue inpatient psych admission; 15-minute checks; daily contact with patient to assess and evaluate symptoms  and progress in treatment; psychoeducation.  -continue scheduled psych medications:  calcium-vitamin D  1 tablet Oral Q breakfast   clonazePAM  1 mg Oral TID   feeding supplement (ENSURE ENLIVE)  237 mL Oral BID BM   multivitamin with minerals  1 tablet Oral Daily   naproxen  250 mg Oral TID WC   pantoprazole  40 mg Oral Daily   -continue PRN medications. acetaminophen, alum & mag hydroxide-simeth, dicyclomine, hydrOXYzine, loperamide, magnesium hydroxide, menthol-cetylpyridinium, methocarbamol, ondansetron, traZODone   -Disposition: d/c home with outpatient psych follow-up, possibly today (will coordinate with SW).  Larita Fife, MD 01/16/2020, 9:20 AM

## 2020-01-16 NOTE — BHH Suicide Risk Assessment (Signed)
[facility] Discharge Suicide Risk Assessment   Principal Problem: <principal problem not specified> Discharge Diagnoses: Active Problems:   Major depression    Mental Status Per Nursing Assessment::   On Admission:  Self-harm behaviors  Demographic Factors:  Age 64 or older  Loss Factors: NA  Historical Factors: NA  Risk Reduction Factors:   Sense of responsibility to family, Religious beliefs about death, Living with another person, especially a relative, Positive social support and Positive therapeutic relationship  Continued Clinical Symptoms:  N/A  Cognitive Features That Contribute To Risk:  None    Suicide Risk:  Mild:  Suicidal ideation of limited frequency, intensity, duration, and specificity.  There are no identifiable plans, no associated intent, mild dysphoria and related symptoms, good self-control (both objective and subjective assessment), few other risk factors, and identifiable protective factors, including available and accessible social support.   Suicide Risk Assessment: prior suicidal attempt (unintentional), age, Caucasian - represent non-modifiable/baseline risk factors. Presence of mental disorder (anxiety) is dynamic risk factor. The patient denies suicidal thoughts, denies a history of intentional suicidal attempts, denies access to firearm. Patient is future-oriented, has responsibilities, help-seeking, has access to mental health care, has family and community support, has cultural/religious beliefs that discourage suicide - all protective factors. Therefore, represents a low risk for harming self acutely and elevated chronic risk due to non-modifiable risk factors.     Plan Of Care/Follow-up recommendations:  Follow-up with an outpatient psychiatrist, therapist, mental health specialist.   Larita Fife, MD 01/16/2020, 10:27 AM

## 2020-03-24 ENCOUNTER — Encounter: Payer: Self-pay | Admitting: Urology

## 2020-03-24 ENCOUNTER — Ambulatory Visit (INDEPENDENT_AMBULATORY_CARE_PROVIDER_SITE_OTHER): Payer: Medicare Other | Admitting: Urology

## 2020-03-24 ENCOUNTER — Other Ambulatory Visit: Payer: Self-pay

## 2020-03-24 VITALS — BP 120/74 | HR 121 | Temp 98.6°F | Ht 65.0 in | Wt 108.0 lb

## 2020-03-24 DIAGNOSIS — R350 Frequency of micturition: Secondary | ICD-10-CM | POA: Diagnosis not present

## 2020-03-24 LAB — URINALYSIS, ROUTINE W REFLEX MICROSCOPIC
Bilirubin, UA: NEGATIVE
Glucose, UA: NEGATIVE
Ketones, UA: NEGATIVE
Nitrite, UA: POSITIVE — AB
Protein,UA: NEGATIVE
Specific Gravity, UA: 1.02 (ref 1.005–1.030)
Urobilinogen, Ur: 0.2 mg/dL (ref 0.2–1.0)
pH, UA: 6.5 (ref 5.0–7.5)

## 2020-03-24 LAB — MICROSCOPIC EXAMINATION
Renal Epithel, UA: NONE SEEN /hpf
WBC, UA: >30 /hpf — AB (ref 0–5)

## 2020-03-24 MED ORDER — MIRABEGRON ER 25 MG PO TB24
25.0000 mg | ORAL_TABLET | Freq: Every day | ORAL | 0 refills | Status: DC
Start: 2020-03-24 — End: 2020-05-16

## 2020-03-24 NOTE — Progress Notes (Signed)
03/24/2020 2:36 PM   Allison Dickerson May 16, 1956 [id number]  Referring provider: Lucia Gaskins, MD [place],  [place] [address]  Urinary frequency  HPI: Allison Dickerson is a [phone] here for evaluation of urinary frequency. Starting 1 year ago she has noted worsening urinary frequency, urinary urgency and nocturia. She has nocturia 3-7x. Each void is a small amount. She has urinary frequency every 15-20 minutes. Urination is worse with caffeine consumption. She has intermittent dysuria. She wears 1 pad per day. Stream starts and stps. She has an intermittent strong stream. No hematuria. She has a hx of cervical cancer but it was not treated with radiation therapy.  She has Glaucoma. No issues with constipation.   PMH: Past Medical History:  Diagnosis Date  . Anxiety   . Cervical cancer ([place])    When younger.  . Diverticulitis   . Pinched nerve    right hand    Surgical History: Past Surgical History:  Procedure Laterality Date  . ABDOMINAL HYSTERECTOMY    . cervical cancer removal      Home Medications:  Allergies as of 03/24/2020      Reactions   Gluten Meal Diarrhea      Medication List       Accurate as of March 24, 2020  2:36 PM. If you have any questions, ask your nurse or doctor.        albuterol 108 (90 Base) MCG/ACT inhaler Commonly known as: VENTOLIN HFA Inhale 1-2 puffs into the lungs every 6 (six) hours as needed for wheezing or shortness of breath.   buPROPion 150 MG 24 hr tablet Commonly known as: WELLBUTRIN XL Take 150 mg by mouth daily.   buPROPion 300 MG 24 hr tablet Commonly known as: WELLBUTRIN XL Take 300 mg by mouth daily.   clonazePAM 1 MG tablet Commonly known as: KlonoPIN Take 1 tablet (1 mg total) by mouth 3 (three) times daily as needed for anxiety. What changed: when to take this   dicyclomine 20 MG tablet Commonly known as: BENTYL Take 1 tablet (20 mg total) by mouth 2 (two) times daily.   LORazepam 1 MG  tablet Commonly known as: ATIVAN Take 1 mg by mouth 3 (three) times daily.   naproxen sodium 220 MG tablet Commonly known as: ALEVE Take 220 mg by mouth daily as needed (FOR PAIN).   naproxen sodium 220 MG tablet Commonly known as: ALEVE naproxen sodium 220 mg tablet  Take 1 tablet twice a day by oral route.   traMADol 50 MG tablet Commonly known as: ULTRAM tramadol 50 mg tablet       Allergies:  Allergies  Allergen Reactions  . Gluten Meal Diarrhea    Family History: Family History  Problem Relation Age of Onset  . Cancer Father   . Cancer Other     Social History:  reports that she has been smoking cigarettes. She has a 10.00 pack-year smoking history. She has never used smokeless tobacco. She reports current drug use. Drug: Marijuana. She reports that she does not drink alcohol.  ROS: All other review of systems were reviewed and are negative except what is noted above in HPI  Physical Exam: BP 120/74   Pulse (!) 121   Temp 98.6 F (37 C)   Ht 5\' 5"  (1.651 m)   Wt 108 lb (49 kg)   BMI 17.97 kg/m   Constitutional:  Alert and oriented, No acute distress. HEENT:  AT, moist mucus membranes.  Trachea  midline, no masses. Cardiovascular: No clubbing, cyanosis, or edema. Respiratory: Normal respiratory effort, no increased work of breathing. GI: Abdomen is soft, nontender, nondistended, no abdominal masses GU: No CVA tenderness.  Lymph: No cervical or inguinal lymphadenopathy. Skin: No rashes, bruises or suspicious lesions. Neurologic: Grossly intact, no focal deficits, moving all 4 extremities. Psychiatric: Normal mood and affect.  Laboratory Data: Lab Results  Component Value Date   WBC 9.0 01/15/2020   HGB 13.4 01/15/2020   HCT 40.4 01/15/2020   MCV 92.4 01/15/2020   PLT 307 01/15/2020    Lab Results  Component Value Date   CREATININE 0.80 01/13/2020    No results found for: PSA  No results found for: TESTOSTERONE  Lab Results  Component  Value Date   HGBA1C 5.7 (H) 01/15/2020    Urinalysis    Component Value Date/Time   COLORURINE YELLOW 02/03/2018 1301   APPEARANCEUR CLEAR 02/03/2018 1301   LABSPEC 1.035 (H) 02/03/2018 1301   PHURINE 7.0 02/03/2018 1301   GLUCOSEU NEGATIVE 02/03/2018 1301   HGBUR NEGATIVE 02/03/2018 1301   BILIRUBINUR NEGATIVE 02/03/2018 1301   KETONESUR 5 (A) 02/03/2018 1301   PROTEINUR NEGATIVE 02/03/2018 1301   UROBILINOGEN 0.2 05/27/2013 1024   NITRITE NEGATIVE 02/03/2018 1301   LEUKOCYTESUR NEGATIVE 02/03/2018 1301    No results found for: LABMICR, WBCUA, RBCUA, LABEPIT, MUCUS, BACTERIA  Pertinent Imaging:  No results found for this or any previous visit.  No results found for this or any previous visit.  No results found for this or any previous visit.  No results found for this or any previous visit.  No results found for this or any previous visit.  No results found for this or any previous visit.  No results found for this or any previous visit.  No results found for this or any previous visit.   Assessment & Plan:    1. Urinary frequency -Urine for culture -We will trial mirabegron 50mg  daily -RCT 4-6 weeks - Urinalysis, Routine w reflex microscopic - BLADDER SCAN AMB NON-IMAGING   No follow-ups on file.  Nicolette Bang, MD  [facility] [place]

## 2020-03-24 NOTE — Progress Notes (Signed)
Urological Symptom Review  Patient is experiencing the following symptoms: Frequent urination Burning/pain with urination Get up at night to urinate Stream starts and stops Trouble starting stream Weak stream   Review of Systems  Gastrointestinal (upper)  : Nausea Indigestion/heartburn  Gastrointestinal (lower) : Negative for lower GI symptoms  Constitutional : Night Sweats Weight loss Fatigue  Skin: Negative for skin symptoms  Eyes: Blurred vision  Ear/Nose/Throat : Sinus problems  Hematologic/Lymphatic: Negative for Hematologic/Lymphatic symptoms  Cardiovascular : Negative for cardiovascular symptoms  Respiratory : Cough Shortness of breath  Endocrine: Excessive thirst  Musculoskeletal: Joint pain  Neurological: Negative for neurological symptoms  Psychologic: Anxiety

## 2020-03-24 NOTE — Patient Instructions (Signed)

## 2020-03-28 ENCOUNTER — Telehealth: Payer: Self-pay

## 2020-03-28 DIAGNOSIS — N390 Urinary tract infection, site not specified: Secondary | ICD-10-CM

## 2020-03-28 LAB — URINE CULTURE

## 2020-03-29 MED ORDER — SULFAMETHOXAZOLE-TRIMETHOPRIM 800-160 MG PO TABS: 1.0000 | ORAL_TABLET | Freq: Two times a day (BID) | ORAL | 0 refills | Status: DC

## 2020-03-29 NOTE — Telephone Encounter (Signed)
Bactrim ds po bid x 7 days sent to pharmacy per Dr. Alyson Ingles for UTI.

## 2020-04-17 ENCOUNTER — Encounter: Payer: Self-pay | Admitting: Obstetrics & Gynecology

## 2020-04-17 ENCOUNTER — Ambulatory Visit (INDEPENDENT_AMBULATORY_CARE_PROVIDER_SITE_OTHER): Payer: Medicare Other | Admitting: Obstetrics & Gynecology

## 2020-04-17 VITALS — BP 123/77 | HR 94 | Ht 67.0 in | Wt 116.0 lb

## 2020-04-17 DIAGNOSIS — B3731 Acute candidiasis of vulva and vagina: Secondary | ICD-10-CM

## 2020-04-17 DIAGNOSIS — B373 Candidiasis of vulva and vagina: Secondary | ICD-10-CM

## 2020-04-17 MED ORDER — FLUCONAZOLE 100 MG PO TABS: 100.0000 mg | ORAL_TABLET | Freq: Every day | ORAL | 0 refills | Status: DC

## 2020-04-17 NOTE — Progress Notes (Signed)
Patient ID: Allison Dickerson, female   DOB: 15-Apr-1956, 64 y.o.   MRN: [id number]      Chief Complaint  Patient presents with  . Vaginal Itching    s/p antibiotics      64 y.o. [id number] No LMP recorded. Patient has had a hysterectomy. The current method of family planning is status post hysterectomy.  Outpatient Encounter Medications as of 04/17/2020  Medication Sig Note  . buPROPion (WELLBUTRIN XL) 150 MG 24 hr tablet Take 150 mg by mouth daily.   Marland Kitchen LORazepam (ATIVAN) 1 MG tablet Take 1 mg by mouth 3 (three) times daily.   Marland Kitchen albuterol (PROVENTIL HFA;VENTOLIN HFA) 108 (90 Base) MCG/ACT inhaler Inhale 1-2 puffs into the lungs every 6 (six) hours as needed for wheezing or shortness of breath. (Patient not taking: Reported on 04/12/2019)   . clonazePAM (KLONOPIN) 1 MG tablet Take 1 tablet (1 mg total) by mouth 3 (three) times daily as needed for anxiety. (Patient taking differently: Take 1 mg by mouth 3 (three) times daily. ) 01/13/2020: Last filled 01-08-20  . dicyclomine (BENTYL) 20 MG tablet Take 1 tablet (20 mg total) by mouth 2 (two) times daily. (Patient not taking: Reported on 04/12/2019)   . fluconazole (DIFLUCAN) 100 MG tablet Take 1 tablet (100 mg total) by mouth daily.   . mirabegron ER (MYRBETRIQ) 25 MG TB24 tablet Take 1 tablet (25 mg total) by mouth daily. (Patient not taking: Reported on 04/17/2020)   . naproxen sodium (ALEVE) 220 MG tablet naproxen sodium 220 mg tablet  Take 1 tablet twice a day by oral route. (Patient not taking: Reported on 03/24/2020)   . naproxen sodium (ANAPROX) 220 MG tablet Take 220 mg by mouth daily as needed (FOR PAIN).  (Patient not taking: Reported on 03/24/2020)   . [DISCONTINUED] buPROPion (WELLBUTRIN XL) 300 MG 24 hr tablet Take 300 mg by mouth daily. (Patient not taking: Reported on 04/17/2020)   . [DISCONTINUED] sulfamethoxazole-trimethoprim (BACTRIM DS) 800-160 MG tablet Take 1 tablet by mouth every 12 (twelve) hours. (Patient not taking: Reported on  04/17/2020)   . [DISCONTINUED] traMADol (ULTRAM) 50 MG tablet tramadol 50 mg tablet (Patient not taking: Reported on 03/24/2020)    No facility-administered encounter medications on file as of 04/17/2020.    Subjective Pt had UTI and was treated with bactrim ds and had post treatment yeast infection She used OTC monistat without complete symtpom relief Currently still having itching burning  Past Medical History:  Diagnosis Date  . Anxiety   . Cervical cancer ([place])    When younger.  . Diverticulitis   . Pinched nerve    right hand    Past Surgical History:  Procedure Laterality Date  . ABDOMINAL HYSTERECTOMY    . cervical cancer removal      OB History    Gravida  3   Para  1   Term  1   Preterm      AB  2   Living  1     SAB      TAB      Ectopic      Multiple      Live Births              Allergies  Allergen Reactions  . Gluten Meal Diarrhea    Social History   Socioeconomic History  . Marital status: Married    Spouse name: Not on file  . Number of children: Not on file  . Years of education:  Not on file  . Highest education level: Not on file  Occupational History  . Occupation: retired  Tobacco Use  . Smoking status: Former Smoker    Packs/day: 0.50    Years: 20.00    Pack years: 10.00    Types: Cigarettes  . Smokeless tobacco: Never Used  Vaping Use  . Vaping Use: Never used  Substance and Sexual Activity  . Alcohol use: No  . Drug use: Yes    Types: Marijuana    Comment: occ  . Sexual activity: Not Currently    Birth control/protection: Surgical  Other Topics Concern  . Not on file  Social History Narrative  . Not on file   Social Determinants of Health   Financial Resource Strain: Low Risk   . Difficulty of Paying Living Expenses: Not hard at all  Food Insecurity: No Food Insecurity  . Worried About Charity fundraiser in the Last Year: Never true  . Ran Out of Food in the Last Year: Never true  Transportation  Needs: No Transportation Needs  . Lack of Transportation (Medical): No  . Lack of Transportation (Non-Medical): No  Physical Activity: Sufficiently Active  . Days of Exercise per Week: 7 days  . Minutes of Exercise per Session: 60 min  Stress: Stress Concern Present  . Feeling of Stress : To some extent  Social Connections: Socially Isolated  . Frequency of Communication with Friends and Family: Once a week  . Frequency of Social Gatherings with Friends and Family: Never  . Attends Religious Services: Never  . Active Member of Clubs or Organizations: No  . Attends Archivist Meetings: Never  . Marital Status: Married    Family History  Problem Relation Age of Onset  . Cancer Father   . Cancer Other     Medications:       Current Outpatient Medications:  .  buPROPion (WELLBUTRIN XL) 150 MG 24 hr tablet, Take 150 mg by mouth daily., Disp: , Rfl:  .  LORazepam (ATIVAN) 1 MG tablet, Take 1 mg by mouth 3 (three) times daily., Disp: , Rfl:  .  albuterol (PROVENTIL HFA;VENTOLIN HFA) 108 (90 Base) MCG/ACT inhaler, Inhale 1-2 puffs into the lungs every 6 (six) hours as needed for wheezing or shortness of breath. (Patient not taking: Reported on 04/12/2019), Disp: 1 Inhaler, Rfl: 0 .  clonazePAM (KLONOPIN) 1 MG tablet, Take 1 tablet (1 mg total) by mouth 3 (three) times daily as needed for anxiety. (Patient taking differently: Take 1 mg by mouth 3 (three) times daily. ), Disp: 90 tablet, Rfl: 0 .  dicyclomine (BENTYL) 20 MG tablet, Take 1 tablet (20 mg total) by mouth 2 (two) times daily. (Patient not taking: Reported on 04/12/2019), Disp: 20 tablet, Rfl: 0 .  fluconazole (DIFLUCAN) 100 MG tablet, Take 1 tablet (100 mg total) by mouth daily., Disp: 7 tablet, Rfl: 0 .  mirabegron ER (MYRBETRIQ) 25 MG TB24 tablet, Take 1 tablet (25 mg total) by mouth daily. (Patient not taking: Reported on 04/17/2020), Disp: 30 tablet, Rfl: 0 .  naproxen sodium (ALEVE) 220 MG tablet, naproxen sodium 220  mg tablet  Take 1 tablet twice a day by oral route. (Patient not taking: Reported on 03/24/2020), Disp: , Rfl:  .  naproxen sodium (ANAPROX) 220 MG tablet, Take 220 mg by mouth daily as needed (FOR PAIN).  (Patient not taking: Reported on 03/24/2020), Disp: , Rfl:   Objective Blood pressure 123/77, pulse 94, height 5\' 7"  (1.702 m),  weight 116 lb (52.6 kg).  General WDWN female NAD Vulva:  Widespread erythema no discharge Vagina:  Atrophic changes Cervix:  absent Uterus:  absent Adnexa: ovaries:absent  Treated with gentian violet  Pertinent ROS No burning with urination, frequency or urgency No nausea, vomiting or diarrhea Nor fever chills or other constitutional symptoms   Labs or studies     Impression Diagnoses this Encounter::   ICD-10-CM   1. Vulvovaginal candidiasis  B37.3     Established relevant diagnosis(es):   Plan/Recommendations: Meds ordered this encounter  Medications  . fluconazole (DIFLUCAN) 100 MG tablet    Sig: Take 1 tablet (100 mg total) by mouth daily.    Dispense:  7 tablet    Refill:  0    Labs or Scans Ordered: No orders of the defined types were placed in this encounter.   Management:: Post antibiotic vulvovaginitis s/p treatment failure with monistat  Follow up Return if symptoms worsen or fail to improve.       All questions were answered.

## 2020-05-08 ENCOUNTER — Other Ambulatory Visit: Payer: Self-pay

## 2020-05-08 ENCOUNTER — Encounter: Payer: Self-pay | Admitting: Urology

## 2020-05-08 ENCOUNTER — Ambulatory Visit (INDEPENDENT_AMBULATORY_CARE_PROVIDER_SITE_OTHER): Payer: Medicare Other | Admitting: Urology

## 2020-05-08 VITALS — BP 136/72 | HR 98 | Temp 98.9°F | Ht 67.0 in | Wt 116.0 lb

## 2020-05-08 DIAGNOSIS — N3281 Overactive bladder: Secondary | ICD-10-CM | POA: Insufficient documentation

## 2020-05-08 DIAGNOSIS — N390 Urinary tract infection, site not specified: Secondary | ICD-10-CM | POA: Diagnosis not present

## 2020-05-08 LAB — MICROSCOPIC EXAMINATION

## 2020-05-08 LAB — URINALYSIS, ROUTINE W REFLEX MICROSCOPIC
Bilirubin, UA: NEGATIVE
Glucose, UA: NEGATIVE
Nitrite, UA: NEGATIVE
Protein,UA: NEGATIVE
Specific Gravity, UA: 1.025 (ref 1.005–1.030)
Urobilinogen, Ur: 0.2 mg/dL (ref 0.2–1.0)
pH, UA: 6 (ref 5.0–7.5)

## 2020-05-08 LAB — BLADDER SCAN AMB NON-IMAGING: Scan Result: 0

## 2020-05-08 NOTE — Progress Notes (Signed)
Bladder Scan Patient can void: 0 ml Performed By: Durenda Guthrie, LPN   Urological Symptom Review  Patient is experiencing the following symptoms: Get up at night to urinate Stream starts and stops Blood in urine   Review of Systems  Gastrointestinal (upper)  : Nausea  Gastrointestinal (lower) : Diarrhea  Constitutional : Night Sweats Fatigue  Skin: Negative for skin symptoms  Eyes: Negative for eye symptoms  Ear/Nose/Throat : Negative for Ear/Nose/Throat symptoms  Hematologic/Lymphatic: Negative for Hematologic/Lymphatic symptoms  Cardiovascular : Negative for cardiovascular symptoms  Respiratory : Cough  Endocrine: Excessive thirst  Musculoskeletal: Joint pain  Neurological: Headaches Dizziness  Psychologic: Anxiety

## 2020-05-08 NOTE — Patient Instructions (Signed)

## 2020-05-08 NOTE — Progress Notes (Signed)
05/08/2020 2:23 PM   Allison Dickerson [birth year] [id number]  Referring provider: No referring provider defined for this encounter.  Followup OAb and UTI  HPI: Allison Dickerson is a [phone] here for followup for UTI and OAB. UA today is normal. NO dysuria. She was given mirabegron 50mg  which improved her urinary frequency, urinary urgency but caused hesitancy. After she finished her antibiotics for e coli her LUTS improved significantly. She is happy with her urination   PMH: Past Medical History:  Diagnosis Date  . Anxiety   . Cervical cancer ([place])    When younger.  . Diverticulitis   . Pinched nerve    right hand    Surgical History: Past Surgical History:  Procedure Laterality Date  . ABDOMINAL HYSTERECTOMY    . cervical cancer removal      Home Medications:  Allergies as of 05/08/2020      Reactions   Gluten Meal Diarrhea      Medication List       Accurate as of May 08, 2020  2:23 PM. If you have any questions, ask your nurse or doctor.        STOP taking these medications   buPROPion 150 MG 24 hr tablet Commonly known as: WELLBUTRIN XL Stopped by: Nicolette Bang, MD   buPROPion 300 MG 24 hr tablet Commonly known as: WELLBUTRIN XL Stopped by: Nicolette Bang, MD   fluconazole 100 MG tablet Commonly known as: DIFLUCAN Stopped by: Nicolette Bang, MD     TAKE these medications   albuterol 108 (90 Base) MCG/ACT inhaler Commonly known as: VENTOLIN HFA Inhale 1-2 puffs into the lungs every 6 (six) hours as needed for wheezing or shortness of breath.   clonazePAM 1 MG tablet Commonly known as: KlonoPIN Take 1 tablet (1 mg total) by mouth 3 (three) times daily as needed for anxiety. What changed: when to take this   dicyclomine 20 MG tablet Commonly known as: BENTYL Take 1 tablet (20 mg total) by mouth 2 (two) times daily.   LORazepam 1 MG tablet Commonly known as: ATIVAN Take 1 mg by mouth 3 (three) times daily.   mirabegron ER 25 MG Tb24  tablet Commonly known as: MYRBETRIQ Take 1 tablet (25 mg total) by mouth daily.   naproxen sodium 220 MG tablet Commonly known as: ALEVE Take 220 mg by mouth daily as needed (FOR PAIN).   naproxen sodium 220 MG tablet Commonly known as: ALEVE naproxen sodium 220 mg tablet  Take 1 tablet twice a day by oral route.       Allergies:  Allergies  Allergen Reactions  . Gluten Meal Diarrhea    Family History: Family History  Problem Relation Age of Onset  . Cancer Father   . Cancer Other     Social History:  reports that she has quit smoking. Her smoking use included cigarettes. She has a 10.00 pack-year smoking history. She has never used smokeless tobacco. She reports current drug use. Drug: Marijuana. She reports that she does not drink alcohol.  ROS: All other review of systems were reviewed and are negative except what is noted above in HPI  Physical Exam: BP 136/72   Pulse 98   Temp 98.9 F (37.2 C)   Ht 5\' 7"  (1.702 m)   Wt 116 lb (52.6 kg)   BMI 18.17 kg/m   Constitutional:  Alert and oriented, No acute distress. HEENT: [place] AT, moist mucus membranes.  Trachea midline, no masses. Cardiovascular: No clubbing, cyanosis, or  edema. Respiratory: Normal respiratory effort, no increased work of breathing. GI: Abdomen is soft, nontender, nondistended, no abdominal masses GU: No CVA tenderness.  Lymph: No cervical or inguinal lymphadenopathy. Skin: No rashes, bruises or suspicious lesions. Neurologic: Grossly intact, no focal deficits, moving all 4 extremities. Psychiatric: Normal mood and affect.  Laboratory Data: Lab Results  Component Value Date   WBC 9.0 01/15/2020   HGB 13.4 01/15/2020   HCT 40.4 01/15/2020   MCV 92.4 01/15/2020   PLT 307 01/15/2020    Lab Results  Component Value Date   CREATININE 0.80 01/13/2020    No results found for: PSA  No results found for: TESTOSTERONE  Lab Results  Component Value Date   HGBA1C 5.7 (H) 01/15/2020     Urinalysis    Component Value Date/Time   COLORURINE YELLOW 02/03/2018 1301   APPEARANCEUR Cloudy (A) 03/24/2020 1433   LABSPEC 1.035 (H) 02/03/2018 1301   PHURINE 7.0 02/03/2018 1301   GLUCOSEU Negative 03/24/2020 1433   HGBUR NEGATIVE 02/03/2018 1301   BILIRUBINUR Negative 03/24/2020 1433   KETONESUR 5 (A) 02/03/2018 1301   PROTEINUR Negative 03/24/2020 1433   PROTEINUR NEGATIVE 02/03/2018 1301   UROBILINOGEN 0.2 05/27/2013 1024   NITRITE Positive (A) 03/24/2020 1433   NITRITE NEGATIVE 02/03/2018 1301   LEUKOCYTESUR 2+ (A) 03/24/2020 1433    Lab Results  Component Value Date   LABMICR See below: 03/24/2020   WBCUA >30 (A) 03/24/2020   LABEPIT 0-10 03/24/2020   BACTERIA Many (A) 03/24/2020    Pertinent Imaging:  No results found for this or any previous visit.  No results found for this or any previous visit.  No results found for this or any previous visit.  No results found for this or any previous visit.  No results found for this or any previous visit.  No results found for this or any previous visit.  No results found for this or any previous visit.  No results found for this or any previous visit.   Assessment & Plan:    1. Urinary tract infection without hematuria, site unspecified -resolved - Urinalysis, Routine w reflex microscopic - BLADDER SCAN AMB NON-IMAGING  2. OAB -We discussed adding Gemtesa and the patient defers at this time.    No follow-ups on file.  Nicolette Bang, MD  [facility] [place]

## 2020-05-16 ENCOUNTER — Encounter: Payer: Self-pay | Admitting: Obstetrics & Gynecology

## 2020-05-16 ENCOUNTER — Other Ambulatory Visit: Payer: Self-pay

## 2020-05-16 ENCOUNTER — Ambulatory Visit (INDEPENDENT_AMBULATORY_CARE_PROVIDER_SITE_OTHER): Payer: Medicare Other | Admitting: Obstetrics & Gynecology

## 2020-05-16 VITALS — BP 107/71 | HR 89 | Wt 114.0 lb

## 2020-05-16 DIAGNOSIS — A6000 Herpesviral infection of urogenital system, unspecified: Secondary | ICD-10-CM | POA: Diagnosis not present

## 2020-05-16 MED ORDER — VALACYCLOVIR HCL 1 G PO TABS: 1000.0000 mg | ORAL_TABLET | Freq: Two times a day (BID) | ORAL | 11 refills | Status: DC

## 2020-05-16 NOTE — Progress Notes (Signed)
Chief Complaint  Patient presents with  . Gynecologic Exam    Perineal rash      64 y.o. [id number] No LMP recorded. Patient has had a hysterectomy. The current method of family planning is status post hysterectomy.  Outpatient Encounter Medications as of 05/16/2020  Medication Sig Note  . acetaminophen (TYLENOL) 500 MG tablet Take 500 mg by mouth every 6 (six) hours as needed.   Marland Kitchen LORazepam (ATIVAN) 1 MG tablet Take 1 mg by mouth 3 (three) times daily.   . valACYclovir (VALTREX) 1000 MG tablet Take 1 tablet (1,000 mg total) by mouth 2 (two) times daily.   . [DISCONTINUED] albuterol (PROVENTIL HFA;VENTOLIN HFA) 108 (90 Base) MCG/ACT inhaler Inhale 1-2 puffs into the lungs every 6 (six) hours as needed for wheezing or shortness of breath. (Patient not taking: Reported on 05/08/2020)   . [DISCONTINUED] clonazePAM (KLONOPIN) 1 MG tablet Take 1 tablet (1 mg total) by mouth 3 (three) times daily as needed for anxiety. (Patient taking differently: Take 1 mg by mouth 3 (three) times daily. ) 01/13/2020: Last filled 01-08-20  . [DISCONTINUED] dicyclomine (BENTYL) 20 MG tablet Take 1 tablet (20 mg total) by mouth 2 (two) times daily. (Patient not taking: Reported on 05/08/2020)   . [DISCONTINUED] mirabegron ER (MYRBETRIQ) 25 MG TB24 tablet Take 1 tablet (25 mg total) by mouth daily. (Patient not taking: Reported on 05/08/2020)   . [DISCONTINUED] naproxen sodium (ALEVE) 220 MG tablet naproxen sodium 220 mg tablet  Take 1 tablet twice a day by oral route. (Patient not taking: Reported on 05/08/2020)   . [DISCONTINUED] naproxen sodium (ANAPROX) 220 MG tablet Take 220 mg by mouth daily as needed (FOR PAIN).  (Patient not taking: Reported on 05/08/2020)    No facility-administered encounter medications on file as of 05/16/2020.    Subjective Pt has a vaginal ulcer/sore that she states has been coming and going for the past year It has never been there when I have seen her Today it is definitvely  HSV, suspect 1 not 2 Associated with lymphadenopathy and flu like symptoms for the past month or so Past Medical History:  Diagnosis Date  . Anxiety   . Cervical cancer ([place])    When younger.  . Diverticulitis   . Pinched nerve    right hand    Past Surgical History:  Procedure Laterality Date  . ABDOMINAL HYSTERECTOMY    . cervical cancer removal      OB History    Gravida  3   Para  1   Term  1   Preterm      AB  2   Living  1     SAB      TAB      Ectopic      Multiple      Live Births              No Known Allergies  Social History   Socioeconomic History  . Marital status: Married    Spouse name: Not on file  . Number of children: Not on file  . Years of education: Not on file  . Highest education level: Not on file  Occupational History  . Occupation: retired  Tobacco Use  . Smoking status: Former Smoker    Packs/day: 0.50    Years: 20.00    Pack years: 10.00    Types: Cigarettes  . Smokeless tobacco: Never Used  Vaping Use  . Vaping Use: Never  used  Substance and Sexual Activity  . Alcohol use: No  . Drug use: Yes    Types: Marijuana    Comment: occ  . Sexual activity: Not Currently    Birth control/protection: Surgical  Other Topics Concern  . Not on file  Social History Narrative  . Not on file   Social Determinants of Health   Financial Resource Strain: Low Risk   . Difficulty of Paying Living Expenses: Not hard at all  Food Insecurity: No Food Insecurity  . Worried About Charity fundraiser in the Last Year: Never true  . Ran Out of Food in the Last Year: Never true  Transportation Needs: No Transportation Needs  . Lack of Transportation (Medical): No  . Lack of Transportation (Non-Medical): No  Physical Activity: Sufficiently Active  . Days of Exercise per Week: 7 days  . Minutes of Exercise per Session: 60 min  Stress: Stress Concern Present  . Feeling of Stress : To some extent  Social Connections: Socially  Isolated  . Frequency of Communication with Friends and Family: Once a week  . Frequency of Social Gatherings with Friends and Family: Never  . Attends Religious Services: Never  . Active Member of Clubs or Organizations: No  . Attends Archivist Meetings: Never  . Marital Status: Married    Family History  Problem Relation Age of Onset  . Cancer Father   . Cancer Other     Medications:       Current Outpatient Medications:  .  acetaminophen (TYLENOL) 500 MG tablet, Take 500 mg by mouth every 6 (six) hours as needed., Disp: , Rfl:  .  LORazepam (ATIVAN) 1 MG tablet, Take 1 mg by mouth 3 (three) times daily., Disp: , Rfl:  .  valACYclovir (VALTREX) 1000 MG tablet, Take 1 tablet (1,000 mg total) by mouth 2 (two) times daily., Disp: 20 tablet, Rfl: 11  Objective Blood pressure 107/71, pulse 89, weight 114 lb (51.7 kg).  Vulva erythematous ulcerative lesion on the left vulva with swelling inflammation and enlarged lymph nodes  Pertinent ROS No burning with urination, frequency or urgency No nausea, vomiting or diarrhea Nor fever chills or other constitutional symptoms   Labs or studies     Impression Diagnoses this Encounter::   ICD-10-CM   1. 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 valACYclovir (VALTREX) 1000 MG tablet    Sig: Take 1 tablet (1,000 mg total) by mouth 2 (two) times daily.    Dispense:  20 tablet    Refill:  11    Labs or Scans Ordered: No orders of the defined types were placed in this encounter.   Management:: Valtrex prescription sent  Follow up Return in about 2 weeks (around 05/30/2020) for Follow up, with Dr Elonda Husky.       All questions were answered.

## 2020-05-30 ENCOUNTER — Other Ambulatory Visit: Payer: Self-pay

## 2020-05-30 ENCOUNTER — Ambulatory Visit (INDEPENDENT_AMBULATORY_CARE_PROVIDER_SITE_OTHER): Payer: Medicare Other | Admitting: Obstetrics & Gynecology

## 2020-05-30 ENCOUNTER — Encounter: Payer: Self-pay | Admitting: Obstetrics & Gynecology

## 2020-05-30 VITALS — BP 128/81 | HR 88 | Ht 67.0 in | Wt 119.0 lb

## 2020-05-30 DIAGNOSIS — A6000 Herpesviral infection of urogenital system, unspecified: Secondary | ICD-10-CM | POA: Diagnosis not present

## 2020-05-30 DIAGNOSIS — K219 Gastro-esophageal reflux disease without esophagitis: Secondary | ICD-10-CM | POA: Diagnosis not present

## 2020-05-30 MED ORDER — ACYCLOVIR 5 % EX OINT
1.0000 | TOPICAL_OINTMENT | CUTANEOUS | 11 refills | Status: DC
Start: 2020-05-30 — End: 2020-06-27

## 2020-05-30 MED ORDER — OMEPRAZOLE 20 MG PO CPDR
20.0000 mg | DELAYED_RELEASE_CAPSULE | Freq: Every day | ORAL | 6 refills | Status: DC
Start: 2020-05-30 — End: 2020-10-24

## 2020-05-30 NOTE — Progress Notes (Signed)
Chief Complaint  Patient presents with  . Follow-up    Wants speculum exam      64 y.o. [id number] No LMP recorded. Patient has had a hysterectomy. The current method of family planning is status post hysterectomy.  Outpatient Encounter Medications as of 05/30/2020  Medication Sig  . acetaminophen (TYLENOL) 500 MG tablet Take 500 mg by mouth every 6 (six) hours as needed.  Marland Kitchen LORazepam (ATIVAN) 1 MG tablet Take 1 mg by mouth 3 (three) times daily.  . valACYclovir (VALTREX) 1000 MG tablet Take 1 tablet (1,000 mg total) by mouth 2 (two) times daily.  Marland Kitchen acyclovir ointment (ZOVIRAX) 5 % Apply 1 application topically every 3 (three) hours.  Marland Kitchen omeprazole (PRILOSEC) 20 MG capsule Take 1 capsule (20 mg total) by mouth daily. 1 tablet a day   No facility-administered encounter medications on file as of 05/30/2020.    Subjective Pt improved quite a bit took all her valtrex Has symptoms with urine first thing in am Has some GERD symtpoms as well Past Medical History:  Diagnosis Date  . Anxiety   . Cervical cancer ([place])    When younger.  . Diverticulitis   . Pinched nerve    right hand    Past Surgical History:  Procedure Laterality Date  . ABDOMINAL HYSTERECTOMY    . cervical cancer removal      OB History    Gravida  3   Para  1   Term  1   Preterm      AB  2   Living  1     SAB      IAB      Ectopic      Multiple      Live Births              No Known Allergies  Social History   Socioeconomic History  . Marital status: Married    Spouse name: Not on file  . Number of children: Not on file  . Years of education: Not on file  . Highest education level: Not on file  Occupational History  . Occupation: retired  Tobacco Use  . Smoking status: Former Smoker    Packs/day: 0.50    Years: 20.00    Pack years: 10.00    Types: Cigarettes  . Smokeless tobacco: Never Used  Vaping Use  . Vaping Use: Never used  Substance and Sexual Activity   . Alcohol use: No  . Drug use: Yes    Types: Marijuana    Comment: occ  . Sexual activity: Not Currently    Birth control/protection: Surgical  Other Topics Concern  . Not on file  Social History Narrative  . Not on file   Social Determinants of Health   Financial Resource Strain: Low Risk   . Difficulty of Paying Living Expenses: Not hard at all  Food Insecurity: No Food Insecurity  . Worried About Charity fundraiser in the Last Year: Never true  . Ran Out of Food in the Last Year: Never true  Transportation Needs: No Transportation Needs  . Lack of Transportation (Medical): No  . Lack of Transportation (Non-Medical): No  Physical Activity: Sufficiently Active  . Days of Exercise per Week: 7 days  . Minutes of Exercise per Session: 60 min  Stress: Stress Concern Present  . Feeling of Stress : To some extent  Social Connections: Socially Isolated  . Frequency of Communication with Friends and Family:  Once a week  . Frequency of Social Gatherings with Friends and Family: Never  . Attends Religious Services: Never  . Active Member of Clubs or Organizations: No  . Attends Archivist Meetings: Never  . Marital Status: Married    Family History  Problem Relation Age of Onset  . Cancer Father   . Cancer Other     Medications:       Current Outpatient Medications:  .  acetaminophen (TYLENOL) 500 MG tablet, Take 500 mg by mouth every 6 (six) hours as needed., Disp: , Rfl:  .  LORazepam (ATIVAN) 1 MG tablet, Take 1 mg by mouth 3 (three) times daily., Disp: , Rfl:  .  valACYclovir (VALTREX) 1000 MG tablet, Take 1 tablet (1,000 mg total) by mouth 2 (two) times daily., Disp: 20 tablet, Rfl: 11 .  acyclovir ointment (ZOVIRAX) 5 %, Apply 1 application topically every 3 (three) hours., Disp: 30 g, Rfl: 11 .  omeprazole (PRILOSEC) 20 MG capsule, Take 1 capsule (20 mg total) by mouth daily. 1 tablet a day, Disp: 30 capsule, Rfl: 6  Objective Blood pressure 128/81, pulse  88, height 5\' 7"  (1.702 m), weight 119 lb (54 kg).  Much improved ulcerative lesions on the left vulva Speculum exam is normal HSV culture done   Pertinent ROS No burning with urination, frequency or urgency No nausea, vomiting or diarrhea Nor fever chills or other constitutional symptoms   Labs or studies     Impression Diagnoses this Encounter::   ICD-10-CM   1. Herpes simplex infection of genitourinary system  A60.00 Herpes simplex virus culture  2. Chronic GERD  K21.9     Established relevant diagnosis(es):   Plan/Recommendations: Meds ordered this encounter  Medications  . acyclovir ointment (ZOVIRAX) 5 %    Sig: Apply 1 application topically every 3 (three) hours.    Dispense:  30 g    Refill:  11  . omeprazole (PRILOSEC) 20 MG capsule    Sig: Take 1 capsule (20 mg total) by mouth daily. 1 tablet a day    Dispense:  30 capsule    Refill:  6    Labs or Scans Ordered: Orders Placed This Encounter  Procedures  . Herpes simplex virus culture    Management::   Follow up Return if symptoms worsen or fail to improve.        All questions were answered.

## 2020-06-04 LAB — HERPES SIMPLEX VIRUS CULTURE

## 2020-06-27 ENCOUNTER — Other Ambulatory Visit: Payer: Self-pay

## 2020-06-27 ENCOUNTER — Ambulatory Visit (INDEPENDENT_AMBULATORY_CARE_PROVIDER_SITE_OTHER): Payer: Medicare Other | Admitting: Internal Medicine

## 2020-06-27 ENCOUNTER — Encounter: Payer: Self-pay | Admitting: Internal Medicine

## 2020-06-27 VITALS — BP 130/75 | HR 100 | Temp 99.0°F | Resp 18 | Ht 67.0 in | Wt 123.0 lb

## 2020-06-27 DIAGNOSIS — K219 Gastro-esophageal reflux disease without esophagitis: Secondary | ICD-10-CM | POA: Diagnosis not present

## 2020-06-27 DIAGNOSIS — M130 Polyarthritis, unspecified: Secondary | ICD-10-CM | POA: Insufficient documentation

## 2020-06-27 DIAGNOSIS — M0580 Other rheumatoid arthritis with rheumatoid factor of unspecified site: Secondary | ICD-10-CM | POA: Insufficient documentation

## 2020-06-27 DIAGNOSIS — G5620 Lesion of ulnar nerve, unspecified upper limb: Secondary | ICD-10-CM | POA: Insufficient documentation

## 2020-06-27 DIAGNOSIS — M25549 Pain in joints of unspecified hand: Secondary | ICD-10-CM | POA: Insufficient documentation

## 2020-06-27 DIAGNOSIS — G5603 Carpal tunnel syndrome, bilateral upper limbs: Secondary | ICD-10-CM | POA: Insufficient documentation

## 2020-06-27 DIAGNOSIS — Z7689 Persons encountering health services in other specified circumstances: Secondary | ICD-10-CM

## 2020-06-27 DIAGNOSIS — R911 Solitary pulmonary nodule: Secondary | ICD-10-CM

## 2020-06-27 DIAGNOSIS — R197 Diarrhea, unspecified: Secondary | ICD-10-CM

## 2020-06-27 DIAGNOSIS — F419 Anxiety disorder, unspecified: Secondary | ICD-10-CM

## 2020-06-27 DIAGNOSIS — Z1211 Encounter for screening for malignant neoplasm of colon: Secondary | ICD-10-CM

## 2020-06-27 MED ORDER — LORAZEPAM 1 MG PO TABS: 1.0000 mg | ORAL_TABLET | Freq: Three times a day (TID) | ORAL | 2 refills | Status: DC | PRN

## 2020-06-27 NOTE — Patient Instructions (Signed)
Please get fasting blood tests done before the next visit.  Please continue to take medications as prescribed.  You are being scheduled to get CT chest done.  Please follow up with GI as scheduled.

## 2020-06-28 ENCOUNTER — Encounter (INDEPENDENT_AMBULATORY_CARE_PROVIDER_SITE_OTHER): Payer: Self-pay | Admitting: *Deleted

## 2020-06-28 DIAGNOSIS — R911 Solitary pulmonary nodule: Secondary | ICD-10-CM | POA: Insufficient documentation

## 2020-06-28 DIAGNOSIS — Z7689 Persons encountering health services in other specified circumstances: Secondary | ICD-10-CM | POA: Insufficient documentation

## 2020-06-28 DIAGNOSIS — R197 Diarrhea, unspecified: Secondary | ICD-10-CM | POA: Insufficient documentation

## 2020-06-28 NOTE — Assessment & Plan Note (Signed)
About 7 mm nodule noted in the periphery of the left lower lobe on CT abdomen in 01/2018 Repeat CT chest

## 2020-06-28 NOTE — Assessment & Plan Note (Addendum)
Chronic in nature, no hematochezia or melena Reports tenesmus, has intermittent episodes of constipation (?IBS) Needs colonoscopy, referred to GI

## 2020-06-28 NOTE — Assessment & Plan Note (Signed)
On Omeprazole 

## 2020-06-28 NOTE — Assessment & Plan Note (Signed)
On Lorazepam 1 mg TID, has been on Benzodiazepine for more than 15 years Did not tolerate Wellbutrin in the past with her previous PCP

## 2020-06-28 NOTE — Assessment & Plan Note (Addendum)
Care established Previous chart reviewed History and medications reviewed with the patient 

## 2020-06-29 ENCOUNTER — Other Ambulatory Visit: Payer: Self-pay

## 2020-06-29 ENCOUNTER — Ambulatory Visit (HOSPITAL_COMMUNITY)
Admission: RE | Admit: 2020-06-29 | Discharge: 2020-06-29 | Disposition: A | Discharge status: Home / Self Care | Payer: Medicare Other | Source: Ambulatory Visit | Attending: Internal Medicine | Admitting: Internal Medicine

## 2020-06-29 DIAGNOSIS — R911 Solitary pulmonary nodule: Secondary | ICD-10-CM

## 2020-06-30 NOTE — Progress Notes (Signed)
New Patient Office Visit  Subjective:  Patient ID: Allison Dickerson, female    DOB: 02-18-1956  Age: 65 y.o. MRN: [id number]  CC:  Chief Complaint  Patient presents with  . New Patient (Initial Visit)    New patient would like to discuss blood work also was getting lorazepam for three times a day this was cut back to 60 without discussion she would like 30 more sent in also feels like she has a constant stomach ache thinks this is nerves     HPI Allison Dickerson is above 65 year old female with past medical history of GERD, polyarthritis, severe anxiety due to caregiver stress and pulmonary nodule who presents for establishing care.  She takes Ativan chronically for her severe anxiety.  She takes care of her mother, who is bedbound and has severe dementia.  She feels stressed as she is taking care of her mother.  She requests a refill for her Ativan as it was sent for only 20 days from her previous provider.  She takes the medications regularly.  She denies depressed mood, change in appetite or weight, change in activity or suicidal or homicidal ideation.  She has a history of polyarthritis, for which she takes Tylenol as needed.  She has a history of GERD, for which she takes omeprazole.  She complains of chronic loose bowel movements, and also has tenesmus.  She has alternating constipation at times.  She denies any melena or hematochezia.  She denies nausea, vomiting, fever, chills or abdominal pain.  Last colonoscopy in 2012.  She has had Hysterectomy for h/o cervical cancer.  She has not had COVID or flu vaccine yet.    Past Medical History:  Diagnosis Date  . Anxiety   . Arthritis    [employer] 06/24/2020  . Cervical cancer ([place])    When younger.  . Diverticulitis   . Pinched nerve    right hand    Past Surgical History:  Procedure Laterality Date  . ABDOMINAL HYSTERECTOMY    . cervical cancer removal    . EYE SURGERY N/A    [facility] 06/24/2020    Family History   Problem Relation Age of Onset  . Cancer Father   . Cancer Other     Social History   Socioeconomic History  . Marital status: Married    Spouse name: Not on file  . Number of children: Not on file  . Years of education: Not on file  . Highest education level: Not on file  Occupational History  . Occupation: retired  Tobacco Use  . Smoking status: Former Smoker    Packs/day: 0.50    Years: 20.00    Pack years: 10.00    Types: Cigarettes  . Smokeless tobacco: Never Used  Vaping Use  . Vaping Use: Never used  Substance and Sexual Activity  . Alcohol use: No  . Drug use: Yes    Types: Marijuana    Comment: occ  . Sexual activity: Not Currently    Birth control/protection: Surgical  Other Topics Concern  . Not on file  Social History Narrative  . Not on file   Social Determinants of Health   Financial Resource Strain: Low Risk   . Difficulty of Paying Living Expenses: Not hard at all  Food Insecurity: No Food Insecurity  . Worried About Charity fundraiser in the Last Year: Never true  . Ran Out of Food in the Last Year: Never true  Transportation Needs: No Transportation  Needs  . Lack of Transportation (Medical): No  . Lack of Transportation (Non-Medical): No  Physical Activity: Sufficiently Active  . Days of Exercise per Week: 7 days  . Minutes of Exercise per Session: 60 min  Stress: Stress Concern Present  . Feeling of Stress : To some extent  Social Connections: Socially Isolated  . Frequency of Communication with Friends and Family: Once a week  . Frequency of Social Gatherings with Friends and Family: Never  . Attends Religious Services: Never  . Active Member of Clubs or Organizations: No  . Attends Archivist Meetings: Never  . Marital Status: Married  Human resources officer Violence: Not At Risk  . Fear of Current or Ex-Partner: No  . Emotionally Abused: No  . Physically Abused: No  . Sexually Abused: No    ROS Review of Systems   Constitutional: Negative for chills and fever.  HENT: Negative for congestion, sinus pressure, sinus pain and sore throat.   Eyes: Negative for pain and discharge.  Respiratory: Negative for cough and shortness of breath.   Cardiovascular: Negative for chest pain and palpitations.  Gastrointestinal: Positive for diarrhea. Negative for abdominal pain, constipation, nausea and vomiting.  Endocrine: Negative for polydipsia and polyuria.  Genitourinary: Negative for dysuria and hematuria.  Musculoskeletal: Negative for neck pain and neck stiffness.  Skin: Negative for rash.  Neurological: Negative for dizziness, speech difficulty, weakness and numbness.  Psychiatric/Behavioral: Positive for sleep disturbance. Negative for agitation, behavioral problems, dysphoric mood and suicidal ideas. The patient is nervous/anxious.     Objective:   Today's Vitals: BP 130/75 (BP Location: Right Arm, Patient Position: Sitting, Cuff Size: Normal)   Pulse 100   Temp 99 F (37.2 C) (Oral)   Resp 18   Ht '5\' 7"'  (1.702 m)   Wt 123 lb (55.8 kg)   SpO2 95%   BMI 19.26 kg/m   Physical Exam Vitals reviewed.  Constitutional:      General: She is not in acute distress.    Appearance: She is not diaphoretic.  HENT:     Head: Normocephalic and atraumatic.     Nose: Nose normal.     Mouth/Throat:     Mouth: Mucous membranes are moist.  Eyes:     General: No scleral icterus.    Extraocular Movements: Extraocular movements intact.     Pupils: Pupils are equal, round, and reactive to light.  Cardiovascular:     Rate and Rhythm: Normal rate and regular rhythm.     Pulses: Normal pulses.     Heart sounds: Normal heart sounds. No murmur heard.   Pulmonary:     Breath sounds: Normal breath sounds. No wheezing or rales.  Abdominal:     Palpations: Abdomen is soft.     Tenderness: There is no abdominal tenderness.  Musculoskeletal:     Cervical back: Neck supple. No tenderness.     Right lower leg: No  edema.     Left lower leg: No edema.  Skin:    General: Skin is warm.     Findings: No rash.  Neurological:     General: No focal deficit present.     Mental Status: She is alert and oriented to person, place, and time.     Sensory: No sensory deficit.     Motor: No weakness.  Psychiatric:        Mood and Affect: Mood normal.        Behavior: Behavior normal.      Assessment &  Plan:   Problem List Items Addressed This Visit      Digestive   GERD (gastroesophageal reflux disease)    On Omeprazole        Other   Anxiety    On Lorazepam 1 mg TID, has been on Benzodiazepine for more than 15 years Did not tolerate Wellbutrin in the past with her previous PCP      Relevant Medications   LORazepam (ATIVAN) 1 MG tablet (Start on 07/05/2020)   Encounter to establish care - Primary    Care established Previous chart reviewed History and medications reviewed with the patient      Relevant Orders   CBC with Differential   CMP14+EGFR   Hemoglobin A1c   Lipid Profile   TSH   Vitamin D (25 hydroxy)   Diarrhea    Chronic in nature, no hematochezia or melena Reports tenesmus, has intermittent episodes of constipation (?IBS) Needs colonoscopy, referred to GI      Relevant Orders   Ambulatory referral to Gastroenterology   Pulmonary nodule    About 7 mm nodule noted in the periphery of the left lower lobe on CT abdomen in 01/2018 Repeat CT chest      Relevant Orders   CT Chest Wo Contrast (Completed)    Other Visit Diagnoses    Encounter for screening colonoscopy       Relevant Orders   Ambulatory referral to Gastroenterology      Outpatient Encounter Medications as of 06/27/2020  Medication Sig  . acetaminophen (TYLENOL) 500 MG tablet Take 500 mg by mouth every 6 (six) hours as needed.  Marland Kitchen omeprazole (PRILOSEC) 20 MG capsule Take 1 capsule (20 mg total) by mouth daily. 1 tablet a day  . [DISCONTINUED] LORazepam (ATIVAN) 1 MG tablet Take 1 mg by mouth 3 (three)  times daily.  Derrill Memo ON 07/05/2020] LORazepam (ATIVAN) 1 MG tablet Take 1 tablet (1 mg total) by mouth every 8 (eight) hours as needed for anxiety.  . [DISCONTINUED] acyclovir ointment (ZOVIRAX) 5 % Apply 1 application topically every 3 (three) hours. (Patient not taking: Reported on 06/27/2020)  . [DISCONTINUED] valACYclovir (VALTREX) 1000 MG tablet Take 1 tablet (1,000 mg total) by mouth 2 (two) times daily. (Patient not taking: Reported on 06/27/2020)   No facility-administered encounter medications on file as of 06/27/2020.    Follow-up: Return in about 3 months (around 09/25/2020).   Lindell Spar, MD

## 2020-09-21 LAB — CBC WITH DIFFERENTIAL/PLATELET
Basophils Absolute: 0.1 10*3/uL (ref 0.0–0.2)
Basos: 1 %
EOS (ABSOLUTE): 0.3 10*3/uL (ref 0.0–0.4)
Eos: 6 %
Hematocrit: 41.1 % (ref 34.0–46.6)
Hemoglobin: 14 g/dL (ref 11.1–15.9)
Immature Grans (Abs): 0 10*3/uL (ref 0.0–0.1)
Immature Granulocytes: 1 %
Lymphocytes Absolute: 1.7 10*3/uL (ref 0.7–3.1)
Lymphs: 28 %
MCH: 30.8 pg (ref 26.6–33.0)
MCHC: 34.1 g/dL (ref 31.5–35.7)
MCV: 91 fL (ref 79–97)
Monocytes Absolute: 0.5 10*3/uL (ref 0.1–0.9)
Monocytes: 8 %
Neutrophils Absolute: 3.5 10*3/uL (ref 1.4–7.0)
Neutrophils: 56 %
Platelets: 278 10*3/uL (ref 150–450)
RBC: 4.54 x10E6/uL (ref 3.77–5.28)
RDW: 12.5 % (ref 11.7–15.4)
WBC: 6.1 10*3/uL (ref 3.4–10.8)

## 2020-09-21 LAB — CMP14+EGFR
ALT: 14 IU/L (ref 0–32)
AST: 16 IU/L (ref 0–40)
Albumin/Globulin Ratio: 1.5 (ref 1.2–2.2)
Albumin: 4.4 g/dL (ref 3.8–4.8)
Alkaline Phosphatase: 63 IU/L (ref 44–121)
BUN/Creatinine Ratio: 24 (ref 12–28)
BUN: 23 mg/dL (ref 8–27)
Bilirubin Total: 0.5 mg/dL (ref 0.0–1.2)
CO2: 18 mmol/L — ABNORMAL LOW (ref 20–29)
Calcium: 9.9 mg/dL (ref 8.7–10.3)
Chloride: 102 mmol/L (ref 96–106)
Creatinine, Ser: 0.97 mg/dL (ref 0.57–1.00)
Globulin, Total: 3 g/dL (ref 1.5–4.5)
Glucose: 107 mg/dL — ABNORMAL HIGH (ref 65–99)
Potassium: 4 mmol/L (ref 3.5–5.2)
Sodium: 141 mmol/L (ref 134–144)
Total Protein: 7.4 g/dL (ref 6.0–8.5)
eGFR: 64 mL/min/{1.73_m2} (ref >59)

## 2020-09-21 LAB — LIPID PANEL
Chol/HDL Ratio: 3 ratio (ref 0.0–4.4)
Cholesterol, Total: 250 mg/dL — ABNORMAL HIGH (ref 100–199)
HDL: 83 mg/dL (ref >39)
LDL Chol Calc (NIH): 146 mg/dL — ABNORMAL HIGH (ref 0–99)
Triglycerides: 122 mg/dL (ref 0–149)
VLDL Cholesterol Cal: 21 mg/dL (ref 5–40)

## 2020-09-21 LAB — TSH: TSH: 1.86 u[IU]/mL (ref 0.450–4.500)

## 2020-09-21 LAB — HEMOGLOBIN A1C
Est. average glucose Bld gHb Est-mCnc: 120 mg/dL
Hgb A1c MFr Bld: 5.8 % — ABNORMAL HIGH (ref 4.8–5.6)

## 2020-09-21 LAB — VITAMIN D 25 HYDROXY (VIT D DEFICIENCY, FRACTURES): Vit D, 25-Hydroxy: 15.1 ng/mL — ABNORMAL LOW (ref 30.0–100.0)

## 2020-09-25 ENCOUNTER — Other Ambulatory Visit: Payer: Self-pay

## 2020-09-25 ENCOUNTER — Encounter: Payer: Self-pay | Admitting: Internal Medicine

## 2020-09-25 ENCOUNTER — Ambulatory Visit (INDEPENDENT_AMBULATORY_CARE_PROVIDER_SITE_OTHER): Payer: Medicare Other | Admitting: Internal Medicine

## 2020-09-25 VITALS — BP 122/84 | HR 97 | Resp 18 | Ht 67.0 in | Wt 121.4 lb

## 2020-09-25 DIAGNOSIS — K219 Gastro-esophageal reflux disease without esophagitis: Secondary | ICD-10-CM

## 2020-09-25 DIAGNOSIS — E559 Vitamin D deficiency, unspecified: Secondary | ICD-10-CM | POA: Diagnosis not present

## 2020-09-25 DIAGNOSIS — R7303 Prediabetes: Secondary | ICD-10-CM | POA: Diagnosis not present

## 2020-09-25 DIAGNOSIS — F419 Anxiety disorder, unspecified: Secondary | ICD-10-CM | POA: Diagnosis not present

## 2020-09-25 DIAGNOSIS — R911 Solitary pulmonary nodule: Secondary | ICD-10-CM

## 2020-09-25 DIAGNOSIS — E782 Mixed hyperlipidemia: Secondary | ICD-10-CM | POA: Insufficient documentation

## 2020-09-25 MED ORDER — LORAZEPAM 1 MG PO TABS: 1.0000 mg | ORAL_TABLET | Freq: Three times a day (TID) | ORAL | 2 refills | Status: DC | PRN

## 2020-09-25 MED ORDER — VITAMIN D (ERGOCALCIFEROL) 1.25 MG (50000 UNIT) PO CAPS: 50000.0000 [IU] | ORAL_CAPSULE | ORAL | 5 refills | Status: DC

## 2020-09-25 MED ORDER — BUSPIRONE HCL 10 MG PO TABS: 10.0000 mg | ORAL_TABLET | Freq: Two times a day (BID) | ORAL | 2 refills | Status: DC

## 2020-09-25 NOTE — Patient Instructions (Addendum)
Please continue to take medications as prescribed.  Please start taking Buspirone for anxiety.  Please start taking Vitamin D as prescribed for Vitamin D deficiency.  Please follow DASH diet to help with cholesterol. [email or web address]">  DASH Eating Plan DASH stands for Dietary Approaches to Stop Hypertension. The DASH eating plan is a healthy eating plan that has been shown to:  Reduce high blood pressure (hypertension).  Reduce your risk for type 2 diabetes, heart disease, and stroke.  Help with weight loss. What are tips for following this plan? Reading food labels  Check food labels for the amount of salt (sodium) per serving. Choose foods with less than 5 percent of the Daily Value of sodium. Generally, foods with less than 300 milligrams (mg) of sodium per serving fit into this eating plan.  To find whole grains, look for the word "whole" as the first word in the ingredient list. Shopping  Buy products labeled as "low-sodium" or "no salt added."  Buy fresh foods. Avoid canned foods and pre-made or frozen meals. Cooking  Avoid adding salt when cooking. Use salt-free seasonings or herbs instead of table salt or sea salt. Check with your health care provider or pharmacist before using salt substitutes.  Do not fry foods. Cook foods using healthy methods such as baking, boiling, grilling, roasting, and broiling instead.  Cook with heart-healthy oils, such as olive, canola, avocado, soybean, or sunflower oil. Meal planning  Eat a balanced diet that includes: ? 4 or more servings of fruits and 4 or more servings of vegetables each day. Try to fill one-half of your plate with fruits and vegetables. ? 6-8 servings of whole grains each day. ? Less than 6 oz (170 g) of lean meat, poultry, or fish each day. A 3-oz (85-g) serving of meat is about the same size as a deck of cards. One egg equals 1 oz (28 g). ? 2-3 servings of low-fat  dairy each day. One serving is 1 cup (237 mL). ? 1 serving of nuts, seeds, or beans 5 times each week. ? 2-3 servings of heart-healthy fats. Healthy fats called omega-3 fatty acids are found in foods such as walnuts, flaxseeds, fortified milks, and eggs. These fats are also found in cold-water fish, such as sardines, salmon, and mackerel.  Limit how much you eat of: ? Canned or prepackaged foods. ? Food that is high in trans fat, such as some fried foods. ? Food that is high in saturated fat, such as fatty meat. ? Desserts and other sweets, sugary drinks, and other foods with added sugar. ? Full-fat dairy products.  Do not salt foods before eating.  Do not eat more than 4 egg yolks a week.  Try to eat at least 2 vegetarian meals a week.  Eat more home-cooked food and less restaurant, buffet, and fast food.   Lifestyle  When eating at a restaurant, ask that your food be prepared with less salt or no salt, if possible.  If you drink alcohol: ? Limit how much you use to:  0-1 drink a day for women who are not pregnant.  0-2 drinks a day for men. ? Be aware of how much alcohol is in your drink. In the [place] one drink equals one 12 oz bottle of beer (355 mL), one 5 oz glass of wine (148 mL), or one 1 oz glass of hard liquor (44 mL). General information  Avoid eating more than 2,300 mg of salt a day. If you have hypertension, you may  need to reduce your sodium intake to 1,500 mg a day.  Work with your health care provider to maintain a healthy body weight or to lose weight. Ask what an ideal weight is for you.  Get at least 30 minutes of exercise that causes your heart to beat faster (aerobic exercise) most days of the week. Activities may include walking, swimming, or biking.  Work with your health care provider or dietitian to adjust your eating plan to your individual calorie needs. What foods should I eat? Fruits All fresh, dried, or frozen fruit. Canned fruit in natural juice  (without added sugar). Vegetables Fresh or frozen vegetables (raw, steamed, roasted, or grilled). Low-sodium or reduced-sodium tomato and vegetable juice. Low-sodium or reduced-sodium tomato sauce and tomato paste. Low-sodium or reduced-sodium canned vegetables. Grains Whole-grain or whole-wheat bread. Whole-grain or whole-wheat pasta. Brown rice. [place]row. Bulgur. Whole-grain and low-sodium cereals. Pita bread. Low-fat, low-sodium crackers. Whole-wheat flour tortillas. Meats and other proteins Skinless chicken or [place]. Ground chicken or [place]. Pork with fat trimmed off. Fish and seafood. Egg whites. Dried beans, peas, or lentils. Unsalted nuts, nut butters, and seeds. Unsalted canned beans. Lean cuts of beef with fat trimmed off. Low-sodium, lean precooked or cured meat, such as sausages or meat loaves. Dairy Low-fat (1%) or fat-free (skim) milk. Reduced-fat, low-fat, or fat-free cheeses. Nonfat, low-sodium ricotta or cottage cheese. Low-fat or nonfat yogurt. Low-fat, low-sodium cheese. Fats and oils Soft margarine without trans fats. Vegetable oil. Reduced-fat, low-fat, or light mayonnaise and salad dressings (reduced-sodium). Canola, safflower, olive, avocado, soybean, and sunflower oils. Avocado. Seasonings and condiments Herbs. Spices. Seasoning mixes without salt. Other foods Unsalted popcorn and pretzels. Fat-free sweets. The items listed above may not be a complete list of foods and beverages you can eat. Contact a dietitian for more information. What foods should I avoid? Fruits Canned fruit in a light or heavy syrup. Fried fruit. Fruit in cream or butter sauce. Vegetables Creamed or fried vegetables. Vegetables in a cheese sauce. Regular canned vegetables (not low-sodium or reduced-sodium). Regular canned tomato sauce and paste (not low-sodium or reduced-sodium). Regular tomato and vegetable juice (not low-sodium or reduced-sodium). Angie Fava. Olives. Grains Baked goods made  with fat, such as croissants, muffins, or some breads. Dry pasta or rice meal packs. Meats and other proteins Fatty cuts of meat. Ribs. Fried meat. Berniece Salines. Bologna, salami, and other precooked or cured meats, such as sausages or meat loaves. Fat from the back of a pig (fatback). Bratwurst. Salted nuts and seeds. Canned beans with added salt. Canned or smoked fish. Whole eggs or egg yolks. Chicken or [place] with skin. Dairy Whole or 2% milk, cream, and half-and-half. Whole or full-fat cream cheese. Whole-fat or sweetened yogurt. Full-fat cheese. Nondairy creamers. Whipped toppings. Processed cheese and cheese spreads. Fats and oils Butter. Stick margarine. Lard. Shortening. Ghee. Bacon fat. Tropical oils, such as coconut, palm kernel, or palm oil. Seasonings and condiments Onion salt, garlic salt, seasoned salt, table salt, and sea salt. Worcestershire sauce. Tartar sauce. Barbecue sauce. Teriyaki sauce. Soy sauce, including reduced-sodium. Steak sauce. Canned and packaged gravies. Fish sauce. Oyster sauce. Cocktail sauce. Store-bought horseradish. Ketchup. Mustard. Meat flavorings and tenderizers. Bouillon cubes. Hot sauces. Pre-made or packaged marinades. Pre-made or packaged taco seasonings. Relishes. Regular salad dressings. Other foods Salted popcorn and pretzels. The items listed above may not be a complete list of foods and beverages you should avoid. Contact a dietitian for more information. Where to find more information  National Heart, Lung, and Blood  Institute: https://wilson-eaton.com/  American Heart Association: www.heart.org  Academy of Nutrition and Dietetics: www.eatright.[place]: www.kidney.org Summary  The DASH eating plan is a healthy eating plan that has been shown to reduce high blood pressure (hypertension). It may also reduce your risk for type 2 diabetes, heart disease, and stroke.  When on the DASH eating plan, aim to eat more fresh fruits and  vegetables, whole grains, lean proteins, low-fat dairy, and heart-healthy fats.  With the DASH eating plan, you should limit salt (sodium) intake to 2,300 mg a day. If you have hypertension, you may need to reduce your sodium intake to 1,500 mg a day.  Work with your health care provider or dietitian to adjust your eating plan to your individual calorie needs. This information is not intended to replace advice given to you by your health care provider. Make sure you discuss any questions you have with your health care provider. Document Revised: 05/07/2019 Document Reviewed: 05/07/2019 Elsevier Patient Education  2021 [employer].

## 2020-09-26 NOTE — Assessment & Plan Note (Signed)
On Omeprazole 

## 2020-09-26 NOTE — Assessment & Plan Note (Signed)
Lab Results  Component Value Date   HGBA1C 5.8 (H) 09/20/2020   Advised to follow low carb diet DASH diet material provided

## 2020-09-26 NOTE — Progress Notes (Addendum)
Established Patient Office Visit  Subjective:  Patient ID: Allison Dickerson, female    DOB: Aug 08, 1955  Age: 65 y.o. MRN: [id number]  CC:  Chief Complaint  Patient presents with  . Follow-up    3 month follow up pt has been more anxious is still taking care of mom also still having stomach pains but she is seeing dr Laural Golden this week     HPI Allison Dickerson a 65 year old female with past medical history of GERD, polyarthritis, severe anxiety due to caregiver stress and pulmonary nodule who presents for follow up of her chronic medical conditions.  She takes Ativan chronically for her severe anxiety.  She takes care of her mother, who is bedbound and has severe dementia.  She feels stressed as she is taking care of her mother. She takes the medications regularly. She denies depressed mood, change in appetite or weight, change in activity or suicidal or homicidal ideation. She requests if she could take higher dose of Ativan or take it more frequently. She is advised to avoid very high dose and/or frequency of Ativan. After discussion, she agreed to take Buspar in addition to Ativan.  She has a history of GERD, for which she takes omeprazole.  She complains of chronic loose bowel movements, and also has tenesmus.  She has alternating constipation at times.  She denies any melena or hematochezia.  She denies nausea, vomiting, fever, chills or abdominal pain. She has an appointment with GI for evaluation of chronic diarrhea.  Past Medical History:  Diagnosis Date  . Anxiety   . Arthritis    Phreesia 06/24/2020  . Cervical cancer ([place])    When younger.  . Diverticulitis   . Pinched nerve    right hand    Past Surgical History:  Procedure Laterality Date  . ABDOMINAL HYSTERECTOMY    . cervical cancer removal    . EYE SURGERY N/A    Phreesia 06/24/2020    Family History  Problem Relation Age of Onset  . Cancer Father   . Cancer Other     Social History   Socioeconomic  History  . Marital status: Married    Spouse name: Not on file  . Number of children: Not on file  . Years of education: Not on file  . Highest education level: Not on file  Occupational History  . Occupation: retired  Tobacco Use  . Smoking status: Former Smoker    Packs/day: 0.50    Years: 20.00    Pack years: 10.00    Types: Cigarettes  . Smokeless tobacco: Never Used  Vaping Use  . Vaping Use: Never used  Substance and Sexual Activity  . Alcohol use: No  . Drug use: Yes    Types: Marijuana    Comment: occ  . Sexual activity: Not Currently    Birth control/protection: Surgical  Other Topics Concern  . Not on file  Social History Narrative  . Not on file   Social Determinants of Health   Financial Resource Strain: Low Risk   . Difficulty of Paying Living Expenses: Not hard at all  Food Insecurity: No Food Insecurity  . Worried About Charity fundraiser in the Last Year: Never true  . Ran Out of Food in the Last Year: Never true  Transportation Needs: No Transportation Needs  . Lack of Transportation (Medical): No  . Lack of Transportation (Non-Medical): No  Physical Activity: Sufficiently Active  . Days of Exercise per Week: 7 days  .  Minutes of Exercise per Session: 60 min  Stress: Stress Concern Present  . Feeling of Stress : To some extent  Social Connections: Socially Isolated  . Frequency of Communication with Friends and Family: Once a week  . Frequency of Social Gatherings with Friends and Family: Never  . Attends Religious Services: Never  . Active Member of Clubs or Organizations: No  . Attends Archivist Meetings: Never  . Marital Status: Married  Human resources officer Violence: Not At Risk  . Fear of Current or Ex-Partner: No  . Emotionally Abused: No  . Physically Abused: No  . Sexually Abused: No    Outpatient Medications Prior to Visit  Medication Sig Dispense Refill  . acetaminophen (TYLENOL) 500 MG tablet Take 500 mg by mouth every 6  (six) hours as needed.    Marland Kitchen omeprazole (PRILOSEC) 20 MG capsule Take 1 capsule (20 mg total) by mouth daily. 1 tablet a day 30 capsule 6  . LORazepam (ATIVAN) 1 MG tablet Take 1 tablet (1 mg total) by mouth every 8 (eight) hours as needed for anxiety. 90 tablet 2   No facility-administered medications prior to visit.    No Known Allergies  ROS Review of Systems  Constitutional: Negative for chills and fever.  HENT: Negative for congestion, sinus pressure, sinus pain and sore throat.   Eyes: Negative for pain and discharge.  Respiratory: Negative for cough and shortness of breath.   Cardiovascular: Negative for chest pain and palpitations.  Gastrointestinal: Positive for diarrhea. Negative for abdominal pain, constipation, nausea and vomiting.  Endocrine: Negative for polydipsia and polyuria.  Genitourinary: Negative for dysuria and hematuria.  Musculoskeletal: Negative for neck pain and neck stiffness.  Skin: Negative for rash.  Neurological: Negative for dizziness, speech difficulty, weakness and numbness.  Psychiatric/Behavioral: Positive for sleep disturbance. Negative for agitation, behavioral problems, dysphoric mood and suicidal ideas. The patient is nervous/anxious.       Objective:    Physical Exam Vitals reviewed.  Constitutional:      General: She is not in acute distress.    Appearance: She is not diaphoretic.  HENT:     Head: Normocephalic and atraumatic.     Nose: Nose normal.     Mouth/Throat:     Mouth: Mucous membranes are moist.  Eyes:     General: No scleral icterus.    Extraocular Movements: Extraocular movements intact.     Pupils: Pupils are equal, round, and reactive to light.  Cardiovascular:     Rate and Rhythm: Normal rate and regular rhythm.     Pulses: Normal pulses.     Heart sounds: Normal heart sounds. No murmur heard.   Pulmonary:     Breath sounds: Normal breath sounds. No wheezing or rales.  Abdominal:     Palpations: Abdomen is  soft.     Tenderness: There is no abdominal tenderness.  Musculoskeletal:     Cervical back: Neck supple. No tenderness.     Right lower leg: No edema.     Left lower leg: No edema.  Skin:    General: Skin is warm.     Findings: No rash.  Neurological:     General: No focal deficit present.     Mental Status: She is alert and oriented to person, place, and time.     Sensory: No sensory deficit.     Motor: No weakness.  Psychiatric:        Mood and Affect: Mood normal.  Behavior: Behavior normal.     BP 122/84 (BP Location: Right Arm, Patient Position: Sitting, Cuff Size: Normal)   Pulse 97   Resp 18   Ht 5\' 7"  (1.702 m)   Wt 121 lb 6.4 oz (55.1 kg)   SpO2 99%   BMI 19.01 kg/m  Wt Readings from Last 3 Encounters:  09/25/20 121 lb 6.4 oz (55.1 kg)  06/27/20 123 lb (55.8 kg)  05/30/20 119 lb (54 kg)     Health Maintenance Due  Topic Date Due  . COVID-19 Vaccine (1) Never done  . COLONOSCOPY (Pts 45-9yrs Insurance coverage will need to be confirmed)  Never done  . MAMMOGRAM  Never done  . DEXA SCAN  Never done  . PNA vac Low Risk Adult (1 of 2 - PCV13) 03/15/2019    There are no preventive care reminders to display for this patient.  Lab Results  Component Value Date   TSH 1.860 09/20/2020   Lab Results  Component Value Date   WBC 6.1 09/20/2020   HGB 14.0 09/20/2020   HCT 41.1 09/20/2020   MCV 91 09/20/2020   PLT 278 09/20/2020   Lab Results  Component Value Date   NA 141 09/20/2020   K 4.0 09/20/2020   CO2 18 (L) 09/20/2020   GLUCOSE 107 (H) 09/20/2020   BUN 23 09/20/2020   CREATININE 0.97 09/20/2020   BILITOT 0.5 09/20/2020   ALKPHOS 63 09/20/2020   AST 16 09/20/2020   ALT 14 09/20/2020   PROT 7.4 09/20/2020   ALBUMIN 4.4 09/20/2020   CALCIUM 9.9 09/20/2020   ANIONGAP 10 01/13/2020   Lab Results  Component Value Date   CHOL 250 (H) 09/20/2020   Lab Results  Component Value Date   HDL 83 09/20/2020   Lab Results  Component Value  Date   LDLCALC 146 (H) 09/20/2020   Lab Results  Component Value Date   TRIG 122 09/20/2020   Lab Results  Component Value Date   CHOLHDL 3.0 09/20/2020   Lab Results  Component Value Date   HGBA1C 5.8 (H) 09/20/2020      Assessment & Plan:   Problem List Items Addressed This Visit      Digestive   GERD (gastroesophageal reflux disease)    On Omeprazole        Other   Anxiety - Primary    On Lorazepam 1 mg TID, has been on Benzodiazepine for more than 15 years Did not tolerate Wellbutrin in the past with her previous PCP Added Buspar for persistent anxiety Discussed about Psychiatry referral - prefers to see someone local, currently no local Psychiatrist available who takes new patients.      Relevant Medications   busPIRone (BUSPAR) 10 MG tablet   LORazepam (ATIVAN) 1 MG tablet (Start on 09/30/2020)   Other Relevant Orders   Benzodiazepines Screen, Urine   Pulmonary nodule    CT chest reviewed Stable pulmonary nodule, denies smoking. No need for repeat imaging for now.      Prediabetes    Lab Results  Component Value Date   HGBA1C 5.8 (H) 09/20/2020   Advised to follow low carb diet DASH diet material provided      Relevant Orders   Urinalysis   Vitamin D deficiency    Last vitamin D Lab Results  Component Value Date   VD25OH 15.1 (L) 09/20/2020   Started Vitamin D 50,000 IU once weekly      Relevant Medications   Vitamin D, Ergocalciferol, (  DRISDOL) 1.25 MG (50000 UNIT) CAPS capsule   Mixed hyperlipidemia    Lipid profile reviewed Advised to follow low cholesterol diet for now         Meds ordered this encounter  Medications  . Vitamin D, Ergocalciferol, (DRISDOL) 1.25 MG (50000 UNIT) CAPS capsule    Sig: Take 1 capsule (50,000 Units total) by mouth every 7 (seven) days.    Dispense:  5 capsule    Refill:  5  . busPIRone (BUSPAR) 10 MG tablet    Sig: Take 1 tablet (10 mg total) by mouth 2 (two) times daily.    Dispense:  60 tablet     Refill:  2  . LORazepam (ATIVAN) 1 MG tablet    Sig: Take 1 tablet (1 mg total) by mouth every 8 (eight) hours as needed for anxiety.    Dispense:  90 tablet    Refill:  2    Follow-up: Return in about 3 months (around 12/25/2020).    Lindell Spar, MD

## 2020-09-26 NOTE — Assessment & Plan Note (Signed)
On Lorazepam 1 mg TID, has been on Benzodiazepine for more than 15 years Did not tolerate Wellbutrin in the past with her previous PCP Added Buspar for persistent anxiety Discussed about Psychiatry referral - prefers to see someone local, currently no local Psychiatrist available who takes new patients.

## 2020-09-26 NOTE — Assessment & Plan Note (Signed)
Lipid profile reviewed Advised to follow low cholesterol diet for now

## 2020-09-26 NOTE — Assessment & Plan Note (Signed)
CT chest reviewed Stable pulmonary nodule, denies smoking. No need for repeat imaging for now.

## 2020-09-26 NOTE — Assessment & Plan Note (Signed)
Last vitamin D Lab Results  Component Value Date   VD25OH 15.1 (L) 09/20/2020   Started Vitamin D 50,000 IU once weekly

## 2020-10-04 ENCOUNTER — Encounter (INDEPENDENT_AMBULATORY_CARE_PROVIDER_SITE_OTHER): Payer: Self-pay

## 2020-10-05 ENCOUNTER — Other Ambulatory Visit (INDEPENDENT_AMBULATORY_CARE_PROVIDER_SITE_OTHER): Payer: Self-pay

## 2020-10-05 ENCOUNTER — Other Ambulatory Visit: Payer: Self-pay

## 2020-10-05 ENCOUNTER — Encounter (INDEPENDENT_AMBULATORY_CARE_PROVIDER_SITE_OTHER): Payer: Self-pay

## 2020-10-05 ENCOUNTER — Encounter (INDEPENDENT_AMBULATORY_CARE_PROVIDER_SITE_OTHER): Payer: Self-pay | Admitting: Gastroenterology

## 2020-10-05 ENCOUNTER — Ambulatory Visit (INDEPENDENT_AMBULATORY_CARE_PROVIDER_SITE_OTHER): Payer: Medicare Other | Admitting: Gastroenterology

## 2020-10-05 ENCOUNTER — Telehealth (INDEPENDENT_AMBULATORY_CARE_PROVIDER_SITE_OTHER): Payer: Self-pay

## 2020-10-05 DIAGNOSIS — K589 Irritable bowel syndrome without diarrhea: Secondary | ICD-10-CM | POA: Insufficient documentation

## 2020-10-05 DIAGNOSIS — K580 Irritable bowel syndrome with diarrhea: Secondary | ICD-10-CM

## 2020-10-05 DIAGNOSIS — K529 Noninfective gastroenteritis and colitis, unspecified: Secondary | ICD-10-CM | POA: Diagnosis not present

## 2020-10-05 DIAGNOSIS — R1012 Left upper quadrant pain: Secondary | ICD-10-CM | POA: Diagnosis not present

## 2020-10-05 MED ORDER — PEG 3350-KCL-NA BICARB-NACL 420 G PO SOLR: 4000.0000 mL | ORAL | 0 refills | Status: DC

## 2020-10-05 MED ORDER — HYOSCYAMINE SULFATE 0.125 MG SL SUBL: SUBLINGUAL_TABLET | SUBLINGUAL | 1 refills | Status: DC

## 2020-10-05 NOTE — Patient Instructions (Addendum)
Explained presumed etiology of IBS symptoms. Patient was counseled about the benefit of implementing a low FODMAP to improve symptoms and recurrent episodes. A dietary list was provided to the patient. Also, the patient was counseled about the benefit of avoiding stressing situations and potential environmental triggers leading to symptomatology. Start Levsin 1 tablet every 8 hours as needed for abdominal pain Stop Bentyl Schedule colonoscopy .Start Imodium as needed for diarrhea

## 2020-10-05 NOTE — Progress Notes (Signed)
Maylon Peppers, M.D. Gastroenterology & Hepatology [facility] [address] [place], [place] [address] Primary Care Physician: Lindell Spar, MD [address] [place] [address]  Referring MD: PCP  Chief Complaint: Chronic diarrhea  History of Present Illness: Allison Dickerson is a 65 y.o. female with PMH anxiety, cervical cancer status post hysterectomy and anxiety, who presents for evaluation of diarrhea.  States that in 2019 she presented persistent diarrhea for a year.  Due to this she had to go to the ER at [facility] to be evaluated.  It was considered her diarrhea was due to IBS and she was given dicyclomine which helped with her bowel movements.  She states that for a long time she was completely asymptomatic.    However she reports that for at least 2 years, she has presented recurrent episodes of watery to mushy bowel movements along with occasional episodes of explosive bowel movements. Has between 5-10 bowel movements a day. Reports that she has frequent tenesmus and rectal pressure but "she cannot relieve the pressure by trying to defecate". Has never had fecal soiling but had fecal urgency multiple times. Has taken dicyclomine since her symptoms recurred intermittently but does not feel it currently helps for diarrhea anymore. Does not take any antidiarrheals. States her symptoms have been intermittent in nature. She reports as well having some episodes of pressure-like LUQ abdominal pain intermittently which does not radiate but worsens before she is going to have a BM. Also describes having bad mouth taste.  Notably, she reports that she has been taking care of her mother for the last 2 years, which she believes has been stressful for her life and may have triggered the recurrence of her symptoms.  Has had on and off black stools, with unclear cause. No hematochezia events.  Says she lost weight 3 years ago when her symptoms  started, she lsot weight down to 100. Now her weight has been more stable, she reports that she forced herself to gain weight by eating more - went up 20 lb.  She was recently started on Buspar by Dr. Posey Pronto but she has not been taking the medication as "I'm not a depressed person but anxious".  The patient denies having any nausea, vomiting, fever, chills,  hematemesis,  jaundice, pruritus or weight loss.  Last EGD:10 years ago per patient, performed by Dr. Precious Bard, she reports that there was a hiatal hernia, no report available Last Colonoscopy:10years ago per patient,  performed by Dr. Precious Bard, snormal, no report available  FHx: neg for any gastrointestinal/liver disease, pancreatic cancer at age 54 Social: neg smoking, alcohol or illicit drug use Surgical: hysterectomy  Past Medical History: Past Medical History:  Diagnosis Date  . Anxiety   . Arthritis    Phreesia 06/24/2020  . Cervical cancer ([place])    When younger.  . Diverticulitis   . Pinched nerve    right hand    Past Surgical History: Past Surgical History:  Procedure Laterality Date  . ABDOMINAL HYSTERECTOMY    . cervical cancer removal    . EYE SURGERY N/A    Phreesia 06/24/2020    Family History: Family History  Problem Relation Age of Onset  . Cancer Father   . Cancer Other     Social History: Social History   Tobacco Use  Smoking Status Former Smoker  . Packs/day: 0.50  . Years: 20.00  . Pack years: 10.00  . Types: Cigarettes  Smokeless Tobacco  Never Used   Social History   Substance and Sexual Activity  Alcohol Use No   Social History   Substance and Sexual Activity  Drug Use Yes  . Types: Marijuana   Comment: occ with gummies    Allergies: No Known Allergies  Medications: Current Outpatient Medications  Medication Sig Dispense Refill  . acetaminophen (TYLENOL) 500 MG tablet Take 500 mg by mouth every 6 (six) hours as needed.    Marland Kitchen LORazepam (ATIVAN) 1 MG tablet Take 1 tablet (1 mg  total) by mouth every 8 (eight) hours as needed for anxiety. 90 tablet 2  . Vitamin D, Ergocalciferol, (DRISDOL) 1.25 MG (50000 UNIT) CAPS capsule Take 1 capsule (50,000 Units total) by mouth every 7 (seven) days. 5 capsule 5  . busPIRone (BUSPAR) 10 MG tablet Take 1 tablet (10 mg total) by mouth 2 (two) times daily. (Patient not taking: Reported on 10/05/2020) 60 tablet 2  . omeprazole (PRILOSEC) 20 MG capsule Take 1 capsule (20 mg total) by mouth daily. 1 tablet a day (Patient not taking: Reported on 10/05/2020) 30 capsule 6   No current facility-administered medications for this visit.    Review of Systems: GENERAL: negative for malaise, night sweats HEENT: No changes in hearing or vision, no nose bleeds or other nasal problems. NECK: Negative for lumps, goiter, pain and significant neck swelling RESPIRATORY: Negative for cough, wheezing CARDIOVASCULAR: Negative for chest pain, leg swelling, palpitations, orthopnea GI: SEE HPI MUSCULOSKELETAL: Negative for joint pain or swelling, back pain, and muscle pain. SKIN: Negative for lesions, rash PSYCH: Negative for sleep disturbance, mood disorder and recent psychosocial stressors. HEMATOLOGY Negative for prolonged bleeding, bruising easily, and swollen nodes. ENDOCRINE: Negative for cold or heat intolerance, polyuria, polydipsia and goiter. NEURO: negative for tremor, gait imbalance, syncope and seizures. The remainder of the review of systems is noncontributory.   Physical Exam: BP 117/83 (BP Location: Left Arm, Patient Position: Sitting, Cuff Size: Small)   Pulse 93   Temp 98.3 F (36.8 C) (Oral)   Ht 5\' 7"  (1.702 m)   Wt 120 lb (54.4 kg)   BMI 18.79 kg/m  GENERAL: The patient is AO x3, in no acute distress. HEENT: Head is normocephalic and atraumatic. EOMI are intact. Mouth is well hydrated and without lesions. NECK: Supple. No masses LUNGS: Clear to auscultation. No presence of rhonchi/wheezing/rales. Adequate chest  expansion HEART: RRR, normal s1 and s2. ABDOMEN: tender to palpation in the LUQ and epigastric area, no guarding, no peritoneal signs, and nondistended. BS +. No masses. EXTREMITIES: Without any cyanosis, clubbing, rash, lesions or edema. NEUROLOGIC: AOx3, no focal motor deficit. SKIN: no jaundice, no rashes   Imaging/Labs: as above  I personally reviewed and interpreted the available labs, imaging and endoscopic files.  Impression and Plan: Allison Dickerson is a 65 y.o. female with PMH anxiety, cervical cancer status post hysterectomy and anxiety, who presents for evaluation of diarrhea.  Her symptoms of diarrhea have been chronic but she has not presented any red flag signs as her weight has been increasing and is able to eat as usual.  She is very clear on the fact that taking care of of her mother has put a toll on her life and these may have led to her gastrointestinal symptoms which point towards functional etiology of her presentation.  I considered she would benefit from starting a low FODMAP diet and switching to Levsin to improve her abdominal pain episodes.  She can also benefit from starting Imodium as  needed for diarrhea.  I discussed with her thoroughly the possibility of using BuSpar to improve her symptoms but the patient is extremely hesitant to start any medications for depression.  We will assess symptom improvement and if still refractory we will consider performing a CT of the abdomen and pelvis with IV contrast.  The patient is due for colorectal cancer screening, she will be scheduled for colonoscopy and random colonic biopsies will be obtained to rule out microscopic colitis.  - Explained presumed etiology of IBS symptoms. Patient was counseled about the benefit of implementing a low FODMAP to improve symptoms and recurrent episodes. A dietary list was provided to the patient. Also, the patient was counseled about the benefit of avoiding stressing situations and potential  environmental triggers leading to symptomatology. - Start Levsin 1 tablet every 8 hours as needed for abdominal pain - Stop Bentyl - Schedule colonoscopy with random colonic biopsies - Start Imodium as needed for diarrhea - Consider CT based on symptom improvement  All questions were answered.      Maylon Peppers, MD Gastroenterology and Hepatology [facility]

## 2020-10-05 NOTE — H&P (View-Only) (Signed)
Allison Dickerson, M.D. Gastroenterology & Hepatology [facility] [address] [place], [place] [address] Primary Care Physician: Lindell Spar, MD [address] [place] [address]  Referring MD: PCP  Chief Complaint: Chronic diarrhea  History of Present Illness: Allison Dickerson is a 65 y.o. female with PMH anxiety, cervical cancer status post hysterectomy and anxiety, who presents for evaluation of diarrhea.  States that in 2019 she presented persistent diarrhea for a year.  Due to this she had to go to the ER at [facility] to be evaluated.  It was considered her diarrhea was due to IBS and she was given dicyclomine which helped with her bowel movements.  She states that for a long time she was completely asymptomatic.    However she reports that for at least 2 years, she has presented recurrent episodes of watery to mushy bowel movements along with occasional episodes of explosive bowel movements. Has between 5-10 bowel movements a day. Reports that she has frequent tenesmus and rectal pressure but "she cannot relieve the pressure by trying to defecate". Has never had fecal soiling but had fecal urgency multiple times. Has taken dicyclomine since her symptoms recurred intermittently but does not feel it currently helps for diarrhea anymore. Does not take any antidiarrheals. States her symptoms have been intermittent in nature. She reports as well having some episodes of pressure-like LUQ abdominal pain intermittently which does not radiate but worsens before she is going to have a BM. Also describes having bad mouth taste.  Notably, she reports that she has been taking care of her mother for the last 2 years, which she believes has been stressful for her life and may have triggered the recurrence of her symptoms.  Has had on and off black stools, with unclear cause. No hematochezia events.  Says she lost weight 3 years ago when her symptoms  started, she lsot weight down to 100. Now her weight has been more stable, she reports that she forced herself to gain weight by eating more - went up 20 lb.  She was recently started on Buspar by Dr. Posey Pronto but she has not been taking the medication as "I'm not a depressed person but anxious".  The patient denies having any nausea, vomiting, fever, chills,  hematemesis,  jaundice, pruritus or weight loss.  Last EGD:10 years ago per patient, performed by Dr. Precious Bard, she reports that there was a hiatal hernia, no report available Last Colonoscopy:10years ago per patient,  performed by Dr. Precious Bard, snormal, no report available  FHx: neg for any gastrointestinal/liver disease, pancreatic cancer at age 3 Social: neg smoking, alcohol or illicit drug use Surgical: hysterectomy  Past Medical History: Past Medical History:  Diagnosis Date  . Anxiety   . Arthritis    Phreesia 06/24/2020  . Cervical cancer ([place])    When younger.  . Diverticulitis   . Pinched nerve    right hand    Past Surgical History: Past Surgical History:  Procedure Laterality Date  . ABDOMINAL HYSTERECTOMY    . cervical cancer removal    . EYE SURGERY N/A    Phreesia 06/24/2020    Family History: Family History  Problem Relation Age of Onset  . Cancer Father   . Cancer Other     Social History: Social History   Tobacco Use  Smoking Status Former Smoker  . Packs/day: 0.50  . Years: 20.00  . Pack years: 10.00  . Types: Cigarettes  Smokeless Tobacco  Never Used   Social History   Substance and Sexual Activity  Alcohol Use No   Social History   Substance and Sexual Activity  Drug Use Yes  . Types: Marijuana   Comment: occ with gummies    Allergies: No Known Allergies  Medications: Current Outpatient Medications  Medication Sig Dispense Refill  . acetaminophen (TYLENOL) 500 MG tablet Take 500 mg by mouth every 6 (six) hours as needed.    Marland Kitchen LORazepam (ATIVAN) 1 MG tablet Take 1 tablet (1 mg  total) by mouth every 8 (eight) hours as needed for anxiety. 90 tablet 2  . Vitamin D, Ergocalciferol, (DRISDOL) 1.25 MG (50000 UNIT) CAPS capsule Take 1 capsule (50,000 Units total) by mouth every 7 (seven) days. 5 capsule 5  . busPIRone (BUSPAR) 10 MG tablet Take 1 tablet (10 mg total) by mouth 2 (two) times daily. (Patient not taking: Reported on 10/05/2020) 60 tablet 2  . omeprazole (PRILOSEC) 20 MG capsule Take 1 capsule (20 mg total) by mouth daily. 1 tablet a day (Patient not taking: Reported on 10/05/2020) 30 capsule 6   No current facility-administered medications for this visit.    Review of Systems: GENERAL: negative for malaise, night sweats HEENT: No changes in hearing or vision, no nose bleeds or other nasal problems. NECK: Negative for lumps, goiter, pain and significant neck swelling RESPIRATORY: Negative for cough, wheezing CARDIOVASCULAR: Negative for chest pain, leg swelling, palpitations, orthopnea GI: SEE HPI MUSCULOSKELETAL: Negative for joint pain or swelling, back pain, and muscle pain. SKIN: Negative for lesions, rash PSYCH: Negative for sleep disturbance, mood disorder and recent psychosocial stressors. HEMATOLOGY Negative for prolonged bleeding, bruising easily, and swollen nodes. ENDOCRINE: Negative for cold or heat intolerance, polyuria, polydipsia and goiter. NEURO: negative for tremor, gait imbalance, syncope and seizures. The remainder of the review of systems is noncontributory.   Physical Exam: BP 117/83 (BP Location: Left Arm, Patient Position: Sitting, Cuff Size: Small)   Pulse 93   Temp 98.3 F (36.8 C) (Oral)   Ht 5\' 7"  (1.702 m)   Wt 120 lb (54.4 kg)   BMI 18.79 kg/m  GENERAL: The patient is AO x3, in no acute distress. HEENT: Head is normocephalic and atraumatic. EOMI are intact. Mouth is well hydrated and without lesions. NECK: Supple. No masses LUNGS: Clear to auscultation. No presence of rhonchi/wheezing/rales. Adequate chest  expansion HEART: RRR, normal s1 and s2. ABDOMEN: tender to palpation in the LUQ and epigastric area, no guarding, no peritoneal signs, and nondistended. BS +. No masses. EXTREMITIES: Without any cyanosis, clubbing, rash, lesions or edema. NEUROLOGIC: AOx3, no focal motor deficit. SKIN: no jaundice, no rashes   Imaging/Labs: as above  I personally reviewed and interpreted the available labs, imaging and endoscopic files.  Impression and Plan: TIYA SCHRUPP is a 65 y.o. female with PMH anxiety, cervical cancer status post hysterectomy and anxiety, who presents for evaluation of diarrhea.  Her symptoms of diarrhea have been chronic but she has not presented any red flag signs as her weight has been increasing and is able to eat as usual.  She is very clear on the fact that taking care of of her mother has put a toll on her life and these may have led to her gastrointestinal symptoms which point towards functional etiology of her presentation.  I considered she would benefit from starting a low FODMAP diet and switching to Levsin to improve her abdominal pain episodes.  She can also benefit from starting Imodium as  needed for diarrhea.  I discussed with her thoroughly the possibility of using BuSpar to improve her symptoms but the patient is extremely hesitant to start any medications for depression.  We will assess symptom improvement and if still refractory we will consider performing a CT of the abdomen and pelvis with IV contrast.  The patient is due for colorectal cancer screening, she will be scheduled for colonoscopy and random colonic biopsies will be obtained to rule out microscopic colitis.  - Explained presumed etiology of IBS symptoms. Patient was counseled about the benefit of implementing a low FODMAP to improve symptoms and recurrent episodes. A dietary list was provided to the patient. Also, the patient was counseled about the benefit of avoiding stressing situations and potential  environmental triggers leading to symptomatology. - Start Levsin 1 tablet every 8 hours as needed for abdominal pain - Stop Bentyl - Schedule colonoscopy with random colonic biopsies - Start Imodium as needed for diarrhea - Consider CT based on symptom improvement  All questions were answered.      Allison Peppers, MD Gastroenterology and Hepatology [facility]

## 2020-10-05 NOTE — Telephone Encounter (Signed)
Allison Dickerson, CMA  

## 2020-10-06 LAB — URINALYSIS
Bilirubin, UA: NEGATIVE
Glucose, UA: NEGATIVE
Leukocytes,UA: NEGATIVE
Nitrite, UA: NEGATIVE
Protein,UA: NEGATIVE
RBC, UA: NEGATIVE
Specific Gravity, UA: 1.017 (ref 1.005–1.030)
Urobilinogen, Ur: 0.2 mg/dL (ref 0.2–1.0)
pH, UA: 5.5 (ref 5.0–7.5)

## 2020-10-07 LAB — BENZODIAZEPINES SCREEN, URINE: BENZODIAZEPINE SCREEN, URINE: NEGATIVE ng/mL

## 2020-10-23 ENCOUNTER — Other Ambulatory Visit: Payer: Self-pay

## 2020-10-23 ENCOUNTER — Other Ambulatory Visit (HOSPITAL_COMMUNITY)
Admission: RE | Admit: 2020-10-23 | Discharge: 2020-10-23 | Disposition: A | Discharge status: Home / Self Care | Payer: Medicare Other | Source: Ambulatory Visit | Attending: Gastroenterology | Admitting: Gastroenterology

## 2020-10-23 DIAGNOSIS — Z01812 Encounter for preprocedural laboratory examination: Secondary | ICD-10-CM | POA: Insufficient documentation

## 2020-10-23 DIAGNOSIS — Z20822 Contact with and (suspected) exposure to covid-19: Secondary | ICD-10-CM | POA: Insufficient documentation

## 2020-10-23 LAB — SARS CORONAVIRUS 2 (TAT 6-24 HRS): SARS Coronavirus 2: NEGATIVE

## 2020-10-24 ENCOUNTER — Ambulatory Visit (HOSPITAL_COMMUNITY): Payer: Medicare Other | Admitting: Anesthesiology

## 2020-10-24 ENCOUNTER — Encounter (HOSPITAL_COMMUNITY)
Admission: RE | Disposition: A | Discharge status: Home / Self Care | Payer: Self-pay | Source: Ambulatory Visit | Attending: Gastroenterology

## 2020-10-24 ENCOUNTER — Ambulatory Visit (HOSPITAL_COMMUNITY)
Admission: RE | Admit: 2020-10-24 | Discharge: 2020-10-24 | Disposition: A | Discharge status: Home / Self Care | Payer: Medicare Other | Source: Ambulatory Visit | Attending: Gastroenterology | Admitting: Gastroenterology

## 2020-10-24 ENCOUNTER — Encounter (HOSPITAL_COMMUNITY): Payer: Self-pay | Admitting: Gastroenterology

## 2020-10-24 ENCOUNTER — Other Ambulatory Visit: Payer: Self-pay

## 2020-10-24 DIAGNOSIS — Z79899 Other long term (current) drug therapy: Secondary | ICD-10-CM | POA: Diagnosis not present

## 2020-10-24 DIAGNOSIS — Q438 Other specified congenital malformations of intestine: Secondary | ICD-10-CM | POA: Insufficient documentation

## 2020-10-24 DIAGNOSIS — R197 Diarrhea, unspecified: Secondary | ICD-10-CM

## 2020-10-24 DIAGNOSIS — R109 Unspecified abdominal pain: Secondary | ICD-10-CM | POA: Diagnosis not present

## 2020-10-24 DIAGNOSIS — D122 Benign neoplasm of ascending colon: Secondary | ICD-10-CM | POA: Diagnosis not present

## 2020-10-24 DIAGNOSIS — Z8541 Personal history of malignant neoplasm of cervix uteri: Secondary | ICD-10-CM | POA: Diagnosis not present

## 2020-10-24 DIAGNOSIS — Z8719 Personal history of other diseases of the digestive system: Secondary | ICD-10-CM | POA: Diagnosis not present

## 2020-10-24 DIAGNOSIS — Z87891 Personal history of nicotine dependence: Secondary | ICD-10-CM | POA: Diagnosis not present

## 2020-10-24 DIAGNOSIS — D123 Benign neoplasm of transverse colon: Secondary | ICD-10-CM

## 2020-10-24 DIAGNOSIS — K449 Diaphragmatic hernia without obstruction or gangrene: Secondary | ICD-10-CM

## 2020-10-24 DIAGNOSIS — K5730 Diverticulosis of large intestine without perforation or abscess without bleeding: Secondary | ICD-10-CM

## 2020-10-24 DIAGNOSIS — Z809 Family history of malignant neoplasm, unspecified: Secondary | ICD-10-CM | POA: Diagnosis not present

## 2020-10-24 HISTORY — PX: ESOPHAGOGASTRODUODENOSCOPY (EGD) WITH PROPOFOL: SHX5813

## 2020-10-24 HISTORY — PX: BIOPSY: SHX5522

## 2020-10-24 HISTORY — PX: POLYPECTOMY: SHX5525

## 2020-10-24 HISTORY — PX: COLONOSCOPY WITH PROPOFOL: SHX5780

## 2020-10-24 LAB — HM COLONOSCOPY

## 2020-10-24 SURGERY — COLONOSCOPY WITH PROPOFOL
Anesthesia: General

## 2020-10-24 MED ORDER — PROPOFOL 500 MG/50ML IV EMUL
INTRAVENOUS | Status: DC | PRN
  Administered 2020-10-24: 150 ug/kg/min via INTRAVENOUS

## 2020-10-24 MED ORDER — PROPOFOL 10 MG/ML IV BOLUS
INTRAVENOUS | Status: DC | PRN
  Administered 2020-10-24: 70 mg via INTRAVENOUS

## 2020-10-24 MED ORDER — LACTATED RINGERS IV SOLN: INTRAVENOUS | Status: DC

## 2020-10-24 MED ORDER — LIDOCAINE HCL (CARDIAC) PF 100 MG/5ML IV SOSY
PREFILLED_SYRINGE | INTRAVENOUS | Status: DC | PRN
  Administered 2020-10-24: 40 mg via INTRAVENOUS

## 2020-10-24 NOTE — Interval H&P Note (Signed)
History and Physical Interval Note:  10/24/2020 8:48 AM Allison Dickerson is a 65 y.o. female with PMH anxiety, cervical cancer status post hysterectomy and anxiety, who presents for evaluation of diarrhea and abdominal pain.  The patient reported after she to her bowel prep her symptoms have improved substantially.  She states that she tried to take the Levsin and had initial improvement of the symptoms but eventually she had recurrence so she has stopped taking this.  Has felt better after taking Imodium for diarrhea.  BP 113/74   Pulse 82   Temp 97.9 F (36.6 C) (Oral)   Resp 12   Ht 5\' 7"  (1.702 m)   Wt 54.4 kg   SpO2 98%   BMI 18.79 kg/m  GENERAL: The patient is AO x3, in no acute distress. HEENT: Head is normocephalic and atraumatic. EOMI are intact. Mouth is well hydrated and without lesions. NECK: Supple. No masses LUNGS: Clear to auscultation. No presence of rhonchi/wheezing/rales. Adequate chest expansion HEART: RRR, normal s1 and s2. ABDOMEN: Soft, nontender, no guarding, no peritoneal signs, and nondistended. BS +. No masses. EXTREMITIES: Without any cyanosis, clubbing, rash, lesions or edema. NEUROLOGIC: AOx3, no focal motor deficit. SKIN: no jaundice, no rashes   Allison Dickerson  has presented today for surgery, with the diagnosis of LUQ Abdominal Pain Screening Colonoscopy.  The various methods of treatment have been discussed with the patient and family. After consideration of risks, benefits and other options for treatment, the patient has consented to  Procedure(s) with comments: COLONOSCOPY WITH PROPOFOL (N/A) - 9:00 am ESOPHAGOGASTRODUODENOSCOPY (EGD) WITH PROPOFOL (N/A) as a surgical intervention.  The patient's history has been reviewed, patient examined, no change in status, stable for surgery.  I have reviewed the patient's chart and labs.  Questions were answered to the patient's satisfaction.     Maylon Peppers [place]a

## 2020-10-24 NOTE — Discharge Instructions (Signed)
You are being discharged to home.  Resume your previous diet.  We are waiting for your pathology results.  Your physician has recommended a repeat colonoscopy for surveillance based on pathology results.  Continue Imodium as needed for diarrhea.   Colonoscopy, Adult, Care After This sheet gives you information about how to care for yourself after your procedure. Your doctor may also give you more specific instructions. If you have problems or questions, call your doctor. What can I expect after the procedure? After the procedure, it is common to have:  A small amount of blood in your poop (stool) for 24 hours.  Some gas.  Mild cramping or bloating in your belly (abdomen). Follow these instructions at home: Eating and drinking  Drink enough fluid to keep your pee (urine) pale yellow.  Follow instructions from your doctor about what you cannot eat or drink.  Return to your normal diet as told by your doctor. Avoid heavy or fried foods that are hard to digest.   Activity  Rest as told by your doctor.  Do not sit for a long time without moving. Get up to take short walks every 1-2 hours. This is important. Ask for help if you feel weak or unsteady.  Return to your normal activities as told by your doctor. Ask your doctor what activities are safe for you. To help cramping and bloating:  Try walking around.  Put heat on your belly as told by your doctor. Use the heat source that your doctor recommends, such as a moist heat pack or a heating pad. ? Put a towel between your skin and the heat source. ? Leave the heat on for 20-30 minutes. ? Remove the heat if your skin turns bright red. This is very important if you are unable to feel pain, heat, or cold. You may have a greater risk of getting burned.   General instructions  If you were given a medicine to help you relax (sedative) during your procedure, it can affect you for many hours. Do not drive or use machinery until your  doctor says that it is safe.  For the first 24 hours after the procedure: ? Do not sign important documents. ? Do not drink alcohol. ? Do your daily activities more slowly than normal. ? Eat foods that are soft and easy to digest.  Take over-the-counter or prescription medicines only as told by your doctor.  Keep all follow-up visits as told by your doctor. This is important. Contact a doctor if:  You have blood in your poop 2-3 days after the procedure. Get help right away if:  You have more than a small amount of blood in your poop.  You see large clumps of tissue (blood clots) in your poop.  Your belly is swollen.  You feel like you may vomit (nauseous).  You vomit.  You have a fever.  You have belly pain that gets worse, and medicine does not help your pain. Summary  After the procedure, it is common to have a small amount of blood in your poop. You may also have mild cramping and bloating in your belly.  If you were given a medicine to help you relax (sedative) during your procedure, it can affect you for many hours. Do not drive or use machinery until your doctor says that it is safe.  Get help right away if you have a lot of blood in your poop, feel like you may vomit, have a fever, or have more  belly pain. This information is not intended to replace advice given to you by your health care provider. Make sure you discuss any questions you have with your health care provider. Document Revised: 04/09/2019 Document Reviewed: 12/28/2018 Elsevier Patient Education  [place].  Colon Polyps  Colon polyps are tissue growths inside the colon, which is part of the large intestine. They are one of the types of polyps that can grow in the body. A polyp may be a round bump or a mushroom-shaped growth. You could have one polyp or more than one. Most colon polyps are noncancerous (benign). However, some colon polyps can become cancerous over time. Finding and removing the  polyps early can help prevent this. What are the causes? The exact cause of colon polyps is not known. What increases the risk? The following factors may make you more likely to develop this condition:  Having a family history of colorectal cancer or colon polyps.  Being older than 66 years of age.  Being younger than 67 years of age and having a significant family history of colorectal cancer or colon polyps or a genetic condition that puts you at higher risk of getting colon polyps.  Having inflammatory bowel disease, such as ulcerative colitis or Crohn's disease.  Having certain conditions passed from parent to child (hereditary conditions), such as: ? Familial adenomatous polyposis (FAP). ? Lynch syndrome. ? Turcot syndrome. ? Peutz-Jeghers syndrome. ? MUTYH-associated polyposis (MAP).  Being overweight.  Certain lifestyle factors. These include smoking cigarettes, drinking too much alcohol, not getting enough exercise, and eating a diet that is high in fat and red meat and low in fiber.  Having had childhood cancer that was treated with radiation of the abdomen. What are the signs or symptoms? Many times, there are no symptoms. If you have symptoms, they may include:  Blood coming from the rectum during a bowel movement.  Blood in the stool (feces). The blood may be bright red or very dark in color.  Pain in the abdomen.  A change in bowel habits, such as constipation or diarrhea. How is this diagnosed? This condition is diagnosed with a colonoscopy. This is a procedure in which a lighted, flexible scope is inserted into the opening between the buttocks (anus) and then passed into the colon to examine the area. Polyps are sometimes found when a colonoscopy is done as part of routine cancer screening tests. How is this treated? This condition is treated by removing any polyps that are found. Most polyps can be removed during a colonoscopy. Those polyps will then be tested  for cancer. Additional treatment may be needed depending on the results of testing. Follow these instructions at home: Eating and drinking  Eat foods that are high in fiber, such as fruits, vegetables, and whole grains.  Eat foods that are high in calcium and vitamin D, such as milk, cheese, yogurt, eggs, liver, fish, and broccoli.  Limit foods that are high in fat, such as fried foods and desserts.  Limit the amount of red meat, precooked or cured meat, or other processed meat that you eat, such as hot dogs, sausages, bacon, or meat loaves.  Limit sugary drinks.   Lifestyle  Maintain a healthy weight, or lose weight if recommended by your health care provider.  Exercise every day or as told by your health care provider.  Do not use any products that contain nicotine or tobacco, such as cigarettes, e-cigarettes, and chewing tobacco. If you need help quitting, ask  your health care provider.  Do not drink alcohol if: ? Your health care provider tells you not to drink. ? You are pregnant, may be pregnant, or are planning to become pregnant.  If you drink alcohol: ? Limit how much you use to:  0-1 drink a day for women.  0-2 drinks a day for men. ? Know how much alcohol is in your drink. In the [place] one drink equals one 12 oz bottle of beer (355 mL), one 5 oz glass of wine (148 mL), or one 1 oz glass of hard liquor (44 mL). General instructions  Take over-the-counter and prescription medicines only as told by your health care provider.  Keep all follow-up visits. This is important. This includes having regularly scheduled colonoscopies. Talk to your health care provider about when you need a colonoscopy. Contact a health care provider if:  You have new or worsening bleeding during a bowel movement.  You have new or increased blood in your stool.  You have a change in bowel habits.  You lose weight for no known reason. Summary  Colon polyps are tissue growths inside the  colon, which is part of the large intestine. They are one type of polyp that can grow in the body.  Most colon polyps are noncancerous (benign), but some can become cancerous over time.  This condition is diagnosed with a colonoscopy.  This condition is treated by removing any polyps that are found. Most polyps can be removed during a colonoscopy. This information is not intended to replace advice given to you by your health care provider. Make sure you discuss any questions you have with your health care provider. Document Revised: 09/22/2019 Document Reviewed: 09/22/2019 Elsevier Patient Education  2021 [place].  Diverticulosis  Diverticulosis is a condition that develops when small pouches (diverticula) form in the wall of the large intestine (colon). The colon is where water is absorbed and stool (feces) is formed. The pouches form when the inside layer of the colon pushes through weak spots in the outer layers of the colon. You may have a few pouches or many of them. The pouches usually do not cause problems unless they become inflamed or infected. When this happens, the condition is called diverticulitis. What are the causes? The cause of this condition is not known. What increases the risk? The following factors may make you more likely to develop this condition:  Being older than age 62. Your risk for this condition increases with age. Diverticulosis is rare among people younger than age 23. By age 69, many people have it.  Eating a low-fiber diet.  Having frequent constipation.  Being overweight.  Not getting enough exercise.  Smoking.  Taking over-the-counter pain medicines, like aspirin and ibuprofen.  Having a family history of diverticulosis. What are the signs or symptoms? In most people, there are no symptoms of this condition. If you do have symptoms, they may include:  Bloating.  Cramps in the abdomen.  Constipation or diarrhea.  Pain in the lower  left side of the abdomen. How is this diagnosed? Because diverticulosis usually has no symptoms, it is most often diagnosed during an exam for other colon problems. The condition may be diagnosed by:  Using a flexible scope to examine the colon (colonoscopy).  Taking an X-ray of the colon after dye has been put into the colon (barium enema).  Having a CT scan. How is this treated? You may not need treatment for this condition. Your health care provider  may recommend treatment to prevent problems. You may need treatment if you have symptoms or if you previously had diverticulitis. Treatment may include:  Eating a high-fiber diet.  Taking a fiber supplement.  Taking a live bacteria supplement (probiotic).  Taking medicine to relax your colon.   Follow these instructions at home: Medicines  Take over-the-counter and prescription medicines only as told by your health care provider.  If told by your health care provider, take a fiber supplement or probiotic. Constipation prevention Your condition may cause constipation. To prevent or treat constipation, you may need to:  Drink enough fluid to keep your urine pale yellow.  Take over-the-counter or prescription medicines.  Eat foods that are high in fiber, such as beans, whole grains, and fresh fruits and vegetables.  Limit foods that are high in fat and processed sugars, such as fried or sweet foods.   General instructions  Try not to strain when you have a bowel movement.  Keep all follow-up visits as told by your health care provider. This is important. Contact a health care provider if you:  Have pain in your abdomen.  Have bloating.  Have cramps.  Have not had a bowel movement in 3 days. Get help right away if:  Your pain gets worse.  Your bloating becomes very bad.  You have a fever or chills, and your symptoms suddenly get worse.  You vomit.  You have bowel movements that are bloody or black.  You have  bleeding from your rectum. Summary  Diverticulosis is a condition that develops when small pouches (diverticula) form in the wall of the large intestine (colon).  You may have a few pouches or many of them.  This condition is most often diagnosed during an exam for other colon problems.  Treatment may include increasing the fiber in your diet, taking supplements, or taking medicines. This information is not intended to replace advice given to you by your health care provider. Make sure you discuss any questions you have with your health care provider. Document Revised: 12/31/2018 Document Reviewed: 12/31/2018 Elsevier Patient Education  [place].

## 2020-10-24 NOTE — Transfer of Care (Signed)
Immediate Anesthesia Transfer of Care Note  Patient: Allison Dickerson  Procedure(s) Performed: COLONOSCOPY WITH PROPOFOL (N/A ) ESOPHAGOGASTRODUODENOSCOPY (EGD) WITH PROPOFOL (N/A ) BIOPSY POLYPECTOMY  Patient Location: PACU  Anesthesia Type:General  Level of Consciousness: drowsy  Airway & Oxygen Therapy: Patient Spontanous Breathing  Post-op Assessment: Report given to RN and Post -op Vital signs reviewed and stable  Post vital signs: Reviewed and stable  Last Vitals:  Vitals Value Taken Time  BP    Temp    Pulse    Resp    SpO2      Last Pain:  Vitals:   10/24/20 0852  TempSrc:   PainSc: 0-No pain      Patients Stated Pain Goal: 6 (54/00/86 7619)  Complications: No complications documented.

## 2020-10-24 NOTE — Op Note (Signed)
[facility] Patient Name: Allison Dickerson Procedure Date: 10/24/2020 8:49 AM MRN: [id number] Date of Birth: 13-Nov-1955 Attending MD: Maylon Peppers ,  CSN: [id number] Age: 65 Admit Type: Outpatient Procedure:                Upper GI endoscopy Indications:              Abdominal pain Providers:                Maylon Peppers, Gwenlyn Fudge, RN, Aram Candela Referring MD:              Medicines:                Monitored Anesthesia Care Complications:            No immediate complications. Estimated Blood Loss:     Estimated blood loss: none. Procedure:                Pre-Anesthesia Assessment:                           - Prior to the procedure, a History and Physical                            was performed, and patient medications, allergies                            and sensitivities were reviewed. The patient's                            tolerance of previous anesthesia was reviewed.                           - The risks and benefits of the procedure and the                            sedation options and risks were discussed with the                            patient. All questions were answered and informed                            consent was obtained.                           - ASA Grade Assessment: II - A patient with mild                            systemic disease.                           After obtaining informed consent, the endoscope was                            passed under direct vision. Throughout the                            procedure, the patient's blood pressure, pulse,  and                            oxygen saturations were monitored continuously. The                            [id number]-H190 ([id number]) scope was introduced through the                            mouth, and advanced to the second part of duodenum.                            The upper GI endoscopy was accomplished without                            difficulty. The patient tolerated the  procedure                            well. Scope In: 8:57:34 AM Scope Out: 9:02:30 AM Total Procedure Duration: 0 hours 4 minutes 56 seconds  Findings:      A 2 cm hiatal hernia was present.      The entire examined stomach was normal.      The examined duodenum was normal. Biopsies were taken with a cold       forceps for histology. Impression:               - 2 cm hiatal hernia.                           - Normal stomach.                           - Normal examined duodenum. Biopsied. Moderate Sedation:      Per Anesthesia Care Recommendation:           - Discharge patient to home (ambulatory).                           - Resume previous diet.                           - Await pathology results. Procedure Code(s):        --- Professional ---                           [phone], Esophagogastroduodenoscopy, flexible,                            transoral; with biopsy, single or multiple Diagnosis Code(s):        --- Professional ---                           K44.9, Diaphragmatic hernia without obstruction or                            gangrene  R10.9, Unspecified abdominal pain CPT copyright 2019 American Medical Association. All rights reserved. The codes documented in this report are preliminary and upon coder review may  be revised to meet current compliance requirements. Maylon Peppers, MD Maylon Peppers,  10/24/2020 9:04:32 AM This report has been signed electronically. Number of Addenda: 0

## 2020-10-24 NOTE — Anesthesia Preprocedure Evaluation (Addendum)
Anesthesia Evaluation  Patient identified by MRN, date of birth, ID band Patient awake    Reviewed: Allergy & Precautions, NPO status , Patient's Chart, lab work & pertinent test results  History of Anesthesia Complications Negative for: history of anesthetic complications  Airway Mallampati: II  TM Distance: >3 FB Neck ROM: Full    Dental  (+) Dental Advisory Given, Poor Dentition, Missing, Loose, Chipped,    Pulmonary former smoker,    Pulmonary exam normal breath sounds clear to auscultation       Cardiovascular Exercise Tolerance: Good Normal cardiovascular exam Rhythm:Regular Rate:Normal     Neuro/Psych PSYCHIATRIC DISORDERS Anxiety Depression  Neuromuscular disease    GI/Hepatic GERD  Poorly Controlled,(+)     substance abuse  marijuana use,   Endo/Other  negative endocrine ROS  Renal/GU negative Renal ROS     Musculoskeletal  (+) Arthritis ,   Abdominal   Peds  Hematology negative hematology ROS (+)   Anesthesia Other Findings   Reproductive/Obstetrics                           Anesthesia Physical Anesthesia Plan  ASA: II  Anesthesia Plan: General   Post-op Pain Management:    Induction: Intravenous  PONV Risk Score and Plan: Propofol infusion  Airway Management Planned: Nasal Cannula and Natural Airway  Additional Equipment:   Intra-op Plan:   Post-operative Plan:   Informed Consent: I have reviewed the patients History and Physical, chart, labs and discussed the procedure including the risks, benefits and alternatives for the proposed anesthesia with the patient or authorized representative who has indicated his/her understanding and acceptance.     Dental advisory given  Plan Discussed with: CRNA and Surgeon  Anesthesia Plan Comments:         Anesthesia Quick Evaluation

## 2020-10-24 NOTE — Op Note (Signed)
[facility] Patient Name: Allison Dickerson Procedure Date: 10/24/2020 9:04 AM MRN: [id number] Date of Birth: [birth year] Attending MD: Maylon Peppers ,  CSN: [id number] Age: 65 Admit Type: Outpatient Procedure:                Colonoscopy Indications:              Clinically significant diarrhea of unexplained                            origin Providers:                Maylon Peppers, Gwenlyn Fudge, RN, Aram Candela Referring MD:              Medicines:                Monitored Anesthesia Care Complications:            No immediate complications. Estimated Blood Loss:     Estimated blood loss: none. Procedure:                Pre-Anesthesia Assessment:                           - Prior to the procedure, a History and Physical                            was performed, and patient medications, allergies                            and sensitivities were reviewed. The patient's                            tolerance of previous anesthesia was reviewed.                           - The risks and benefits of the procedure and the                            sedation options and risks were discussed with the                            patient. All questions were answered and informed                            consent was obtained.                           - ASA Grade Assessment: II - A patient with mild                            systemic disease.                           After obtaining informed consent, the colonoscope                            was passed under direct vision. Throughout the  procedure, the patient's blood pressure, pulse, and                            oxygen saturations were monitored continuously. The                            [id number]-[id number] scope was introduced through                            the anus and advanced to the the terminal ileum.                            The colonoscopy was technically difficult and                             complex due to a redundant colon. The patient                            tolerated the procedure well. The quality of the                            bowel preparation was good. Scope In: 9:06:33 AM Scope Out: 9:36:36 AM Scope Withdrawal Time: 0 hours 20 minutes 11 seconds  Total Procedure Duration: 0 hours 30 minutes 3 seconds  Findings:      The perianal and digital rectal examinations were normal.      The terminal ileum appeared normal.      A 6 mm polyp was found in the transverse colon. The polyp was flat. Area       was successfully injected with 2 mL Eleview for a lift polypectomy.       Imaging was performed using white light and narrow band imaging to       visualize the mucosa and demarcate the polyp site after injection for       EMR purposes. The polyp was removed with a cold snare. Resection and       retrieval were complete.      The colon (entire examined portion) was moderately tortuous. Biopsies       for histology were taken with a cold forceps from the right colon and       left colon for evaluation of microscopic colitis.      Multiple small and large-mouthed diverticula were found in the sigmoid       colon and descending colon.      The retroflexed view of the distal rectum and anal verge was normal and       showed no anal or rectal abnormalities. Impression:               - The examined portion of the ileum was normal.                           - One 6 mm polyp in the transverse colon, removed                            with a cold snare. Resected and retrieved. Injected.                           -  Tortuous colon. Biopsied.                           - Diverticulosis in the sigmoid colon and in the                            descending colon.                           - The distal rectum and anal verge are normal on                            retroflexion view. Moderate Sedation:      Per Anesthesia Care Recommendation:           - Discharge patient  to home (ambulatory).                           - Resume previous diet.                           - Await pathology results.                           - Repeat colonoscopy for surveillance based on                            pathology results.                           - Continue Imodium as needed for diarrhea. Procedure Code(s):        --- Professional ---                           [phone], 59, Colonoscopy, flexible; with removal of                            tumor(s), polyp(s), or other lesion(s) by snare                            technique                           45381, Colonoscopy, flexible; with directed                            submucosal injection(s), any substance                           50539, 59, Colonoscopy, flexible; with biopsy,                            single or multiple Diagnosis Code(s):        --- Professional ---                           K63.5, Polyp of colon  R19.7, Diarrhea, unspecified                           K57.30, Diverticulosis of large intestine without                            perforation or abscess without bleeding                           Q43.8, Other specified congenital malformations of                            intestine CPT copyright 2019 American Medical Association. All rights reserved. The codes documented in this report are preliminary and upon coder review may  be revised to meet current compliance requirements. Maylon Peppers, MD Maylon Peppers,  10/24/2020 9:49:28 AM This report has been signed electronically. Number of Addenda: 0

## 2020-10-24 NOTE — Anesthesia Postprocedure Evaluation (Signed)
Anesthesia Post Note  Patient: Allison Dickerson  Procedure(s) Performed: COLONOSCOPY WITH PROPOFOL (N/A ) ESOPHAGOGASTRODUODENOSCOPY (EGD) WITH PROPOFOL (N/A ) BIOPSY POLYPECTOMY  Patient location during evaluation: Endoscopy Anesthesia Type: General Level of consciousness: awake and alert and oriented Pain management: pain level controlled Vital Signs Assessment: post-procedure vital signs reviewed and stable Respiratory status: spontaneous breathing and respiratory function stable Cardiovascular status: blood pressure returned to baseline and stable Postop Assessment: no apparent nausea or vomiting Anesthetic complications: no   No complications documented.   Last Vitals:  Vitals:   10/24/20 0747 10/24/20 0940  BP: 113/74 110/66  Pulse:    Resp:  16  Temp:  36.6 C  SpO2:  99%    Last Pain:  Vitals:   10/24/20 0940  TempSrc: Oral  PainSc: 0-No pain                 Ikey Omary C Redina Zeller

## 2020-10-25 ENCOUNTER — Encounter (INDEPENDENT_AMBULATORY_CARE_PROVIDER_SITE_OTHER): Payer: Self-pay | Admitting: *Deleted

## 2020-10-25 LAB — SURGICAL PATHOLOGY

## 2020-10-30 ENCOUNTER — Encounter (HOSPITAL_COMMUNITY): Payer: Self-pay | Admitting: Gastroenterology

## 2020-11-01 ENCOUNTER — Ambulatory Visit: Payer: Medicare Other | Admitting: Urology

## 2020-11-02 ENCOUNTER — Other Ambulatory Visit (INDEPENDENT_AMBULATORY_CARE_PROVIDER_SITE_OTHER): Payer: Self-pay | Admitting: Gastroenterology

## 2020-11-02 MED ORDER — CILIDINIUM-CHLORDIAZEPOXIDE 2.5-5 MG PO CAPS: 1.0000 | ORAL_CAPSULE | Freq: Three times a day (TID) | ORAL | 3 refills | Status: DC | PRN

## 2020-11-06 ENCOUNTER — Telehealth: Payer: Self-pay

## 2020-11-07 ENCOUNTER — Other Ambulatory Visit: Payer: Medicare Other

## 2020-11-07 ENCOUNTER — Other Ambulatory Visit (INDEPENDENT_AMBULATORY_CARE_PROVIDER_SITE_OTHER): Payer: Self-pay | Admitting: Gastroenterology

## 2020-11-07 ENCOUNTER — Telehealth (INDEPENDENT_AMBULATORY_CARE_PROVIDER_SITE_OTHER): Payer: Self-pay

## 2020-11-07 DIAGNOSIS — N390 Urinary tract infection, site not specified: Secondary | ICD-10-CM

## 2020-11-07 LAB — URINALYSIS, ROUTINE W REFLEX MICROSCOPIC
Bilirubin, UA: NEGATIVE
Glucose, UA: NEGATIVE
Ketones, UA: NEGATIVE
Nitrite, UA: NEGATIVE
Protein,UA: NEGATIVE
Specific Gravity, UA: <1.005 — ABNORMAL LOW (ref 1.005–1.030)
Urobilinogen, Ur: 0.2 mg/dL (ref 0.2–1.0)
pH, UA: 6.5 (ref 5.0–7.5)

## 2020-11-07 LAB — MICROSCOPIC EXAMINATION
Renal Epithel, UA: NONE SEEN /hpf
WBC, UA: >30 /hpf — AB (ref 0–5)

## 2020-11-07 MED ORDER — HYOSCYAMINE SULFATE 0.125 MG SL SUBL: 0.1250 mg | SUBLINGUAL_TABLET | Freq: Four times a day (QID) | SUBLINGUAL | 2 refills | Status: DC | PRN

## 2020-11-07 MED ORDER — CLIDINIUM BROMIDE POWD: 2.5000 mg | Freq: Three times a day (TID) | 2 refills | Status: DC | PRN

## 2020-11-07 NOTE — Telephone Encounter (Signed)
Patient states she does not want to come off the Ativan. She wants to know if she could pay out of pocket for the levsin since the insurance will not pay. If so she would like it to be sent in.

## 2020-11-07 NOTE — Telephone Encounter (Signed)
I called the drug store they applied a discount card to the medication and the price will now be $33.24. Patient is aware and willing to pay.

## 2020-11-07 NOTE — Telephone Encounter (Signed)
Patient states she is going to call her pharmacy to see what they suggest and she will call me back with her decision.

## 2020-11-07 NOTE — Telephone Encounter (Signed)
Yes, she can pay it out of pocket. Medication was sent.

## 2020-11-07 NOTE — Telephone Encounter (Signed)
Please let the patient know they can try the Librax at the dose I initially prescribed without Ativan or they will need to take only the Ativan, as the Levsin is not covered by her insurance unfortunately Thanks

## 2020-11-07 NOTE — Telephone Encounter (Signed)
So you are going to prescribe the stomach portion alone and she can continue to take her Ativan 1 mg TID ? Please advise.

## 2020-11-07 NOTE — Telephone Encounter (Signed)
Thanks

## 2020-11-07 NOTE — Telephone Encounter (Signed)
That would also be good, a dose of 25 mg TID is too high in my opinion. I will prescribe the clinidium then.  Thanks

## 2020-11-07 NOTE — Telephone Encounter (Signed)
Patient insurance sent a form asking if the patient was going to continue on Ativan, if she was going to be started on Chlordiazepoxide as these two medications are in the same drug class. She states she has been on Ativan for years and takes 1 mg TID, plans on continuing, But wants to know if this new med the chlordiazepoxide would do the same thing as the ativan and help with her stomach issues. She states if so she will discontinue the Ativan. Please advise.

## 2020-11-07 NOTE — Telephone Encounter (Signed)
[facility] states they do not carry this drug alone, I called [facility] and they say the same. It can be given in the combination ,but not alone. Please advise.

## 2020-11-07 NOTE — Telephone Encounter (Signed)
Patient husband called back stating he had spoken with the pharmacist and they say the conversion from the Ativan 1 mg TID to Cholordiazepoxide would be 75 mg or 25 mg TID. OR per patient husband the pharmacist states patient could stay on Ativan and take the stomach portion with it . Please advise.

## 2020-11-07 NOTE — Telephone Encounter (Signed)
It would lead to similar effect and can take it three times a day. She would need to try it and see how the medication works for her. Thanks

## 2020-11-08 ENCOUNTER — Ambulatory Visit: Payer: Medicare Other | Admitting: Urology

## 2020-11-08 MED ORDER — SULFAMETHOXAZOLE-TRIMETHOPRIM 800-160 MG PO TABS: 1.0000 | ORAL_TABLET | Freq: Two times a day (BID) | ORAL | 0 refills | Status: DC

## 2020-11-08 NOTE — Telephone Encounter (Signed)
Per Dr. Alyson Ingles patient is to start Bactrim. Rx sent in.  Patient called and made aware of.

## 2020-11-09 ENCOUNTER — Telehealth: Payer: Self-pay | Admitting: Internal Medicine

## 2020-11-09 ENCOUNTER — Telehealth: Payer: Self-pay

## 2020-11-09 NOTE — Telephone Encounter (Signed)
Pt returned your call and said she can not do a AWV this year.  She is caring for a 67 Yr Old mother that is not doing well and she said she just can not even do a phone call for this.  Please wait till next year to call her back about this.

## 2020-11-09 NOTE — Telephone Encounter (Signed)
Left message for patient to call back and schedule Medicare Annual Wellness Visit (AWV) either virtually or in office.   AWV-I PER PAL[place] 02/16/2020  please schedule at anytime with [facility]  health coach  This should be a 40 minute visit.

## 2020-11-10 ENCOUNTER — Telehealth: Payer: Self-pay

## 2020-11-10 DIAGNOSIS — N390 Urinary tract infection, site not specified: Secondary | ICD-10-CM

## 2020-11-10 LAB — URINE CULTURE

## 2020-11-10 MED ORDER — NITROFURANTOIN MONOHYD MACRO 100 MG PO CAPS: 100.0000 mg | ORAL_CAPSULE | Freq: Two times a day (BID) | ORAL | 0 refills | Status: DC

## 2020-11-10 NOTE — Telephone Encounter (Signed)
Thank you for letting me know I will forward this to my careguide as I do not call the patients to schedule.

## 2020-11-10 NOTE — Telephone Encounter (Signed)
New rx sent in per Dr. Alyson Ingles. Husband made aware of the change in antibiotic due the the culture results.

## 2020-11-14 NOTE — Telephone Encounter (Signed)
Documented on spreadsheet 

## 2020-11-15 ENCOUNTER — Telehealth: Payer: Self-pay

## 2020-11-15 NOTE — Telephone Encounter (Signed)
Pt was given macrobid 100mg  on 05/27 called no answer.   My chart message sent.

## 2020-11-15 NOTE — Telephone Encounter (Signed)
-----   Message from Cleon Gustin, MD sent at 11/14/2020 10:01 AM EDT ----- Macrobid 100mg  BID for 7 days ----- Message ----- From: Iris Pert, LPN Sent: 8/42/1031   4:07 PM EDT To: Cleon Gustin, MD  Patient started on Bactrim 11/08/20

## 2020-11-23 ENCOUNTER — Telehealth: Payer: Self-pay | Admitting: Urology

## 2020-11-23 NOTE — Telephone Encounter (Signed)
Patient will come leave urine specimen on Tuesday.

## 2020-11-23 NOTE — Telephone Encounter (Signed)
Pt called after being tx'd for bladder infection w/in the last week or so & was requesting to have her urine retested, as she feels the infection has not cleared (swollen/tender in the genital area).  IF @ all possible, she would like to come in Tues around 1, so that her 67 yr old mother is not left alone.  Plz advise by calling back.  Thank you

## 2020-11-28 ENCOUNTER — Other Ambulatory Visit: Payer: Medicare Other

## 2020-11-28 ENCOUNTER — Telehealth: Payer: Self-pay | Admitting: Obstetrics & Gynecology

## 2020-11-28 ENCOUNTER — Other Ambulatory Visit: Payer: Self-pay | Admitting: Urology

## 2020-11-28 ENCOUNTER — Other Ambulatory Visit: Payer: Self-pay

## 2020-11-28 DIAGNOSIS — N390 Urinary tract infection, site not specified: Secondary | ICD-10-CM

## 2020-11-28 NOTE — Telephone Encounter (Signed)
Patient called stating that she has an open sore that might be herpes and she states that he wanted to look at it when there was one that was fresh because the other one was dry. She has made an appointment with him but she would like to know what she can place on it while she waits for the appointment to see him. Please contact pt

## 2020-11-28 NOTE — Telephone Encounter (Signed)
Scheduled patient to come see Anderson Malta tomorrow.

## 2020-11-29 ENCOUNTER — Encounter: Payer: Self-pay | Admitting: Adult Health

## 2020-11-29 ENCOUNTER — Ambulatory Visit (INDEPENDENT_AMBULATORY_CARE_PROVIDER_SITE_OTHER): Payer: Medicare Other | Admitting: Adult Health

## 2020-11-29 VITALS — BP 116/81 | HR 97 | Ht 67.0 in | Wt 118.0 lb

## 2020-11-29 DIAGNOSIS — N898 Other specified noninflammatory disorders of vagina: Secondary | ICD-10-CM | POA: Diagnosis not present

## 2020-11-29 DIAGNOSIS — N952 Postmenopausal atrophic vaginitis: Secondary | ICD-10-CM | POA: Diagnosis not present

## 2020-11-29 DIAGNOSIS — N362 Urethral caruncle: Secondary | ICD-10-CM | POA: Insufficient documentation

## 2020-11-29 DIAGNOSIS — L900 Lichen sclerosus et atrophicus: Secondary | ICD-10-CM | POA: Insufficient documentation

## 2020-11-29 LAB — URINALYSIS, ROUTINE W REFLEX MICROSCOPIC
Bilirubin, UA: NEGATIVE
Glucose, UA: NEGATIVE
Nitrite, UA: NEGATIVE
Protein,UA: NEGATIVE
Specific Gravity, UA: 1.02 (ref 1.005–1.030)
Urobilinogen, Ur: 0.2 mg/dL (ref 0.2–1.0)
pH, UA: 5.5 (ref 5.0–7.5)

## 2020-11-29 LAB — MICROSCOPIC EXAMINATION: Bacteria, UA: NONE SEEN

## 2020-11-29 MED ORDER — CLOBETASOL PROPIONATE 0.05 % EX OINT: TOPICAL_OINTMENT | CUTANEOUS | 3 refills | Status: DC

## 2020-11-29 MED ORDER — ESTRADIOL 0.1 MG/GM VA CREA: TOPICAL_CREAM | VAGINAL | 2 refills | Status: DC

## 2020-11-29 NOTE — Progress Notes (Signed)
  Subjective:     Patient ID: IDABELL PICKING, female   DOB: 23-May-1956, 65 y.o.   MRN: [id number]  HPI Janicia is a 65 year old white female, married, PM in complaining of vaginal irritation, burns when pee hits skin. She said it started after having colonoscopy and had UTI. She saw urologist yesterday.  PCP is Dr Posey Pronto.  Review of Systems Has vaginal irritation Burns when pee hits skin Reviewed past medical,surgical, social and family history. Reviewed medications and allergies.     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 Bladder is non tender and no masses felt. HSV culture obtained.    Fall risk is low  Upstream - 11/29/20 1612       Pregnancy Intention Screening   Does the patient want to become pregnant in the next year? N/A    Does the patient's partner want to become pregnant in the next year? N/A    Would the patient like to discuss contraceptive options today? N/A      Contraception Wrap Up   Current Method Female Sterilization   hyst   End Method Female Sterilization   hyst   Contraception Counseling Provided No            Examination chaperoned by Levy Pupa LPN.  Assessment:     1. Vaginal irritation HSV culture set   2. Vaginal atrophy Will try estrace cream Meds ordered this encounter  Medications   estradiol (ESTRACE VAGINAL) 0.1 MG/GM vaginal cream    Sig: Use 0.5 gm in vagina at bedtime daily for 2 weeks then 2-3 x weekly    Dispense:  42.5 g    Refill:  2    Order Specific Question:   Supervising Provider    Answer:   Tania Ade H [[id number]]   clobetasol ointment (TEMOVATE) 0.05 %     Sig: Use 2 x daily for 4 weeks then 2 x weekly    Dispense:  30 g    Refill:  3    Order Specific Question:   Supervising Provider    Answer:   Elonda Husky, LUTHER H [[id number]]     3. Lichen sclerosus Will try temovate  4. Urethral caruncle     Plan:    Follow up in 4 weeks

## 2020-11-30 ENCOUNTER — Telehealth: Payer: Self-pay | Admitting: *Deleted

## 2020-11-30 LAB — URINE CULTURE

## 2020-11-30 NOTE — Telephone Encounter (Signed)
Pt's plan don't cover Clobetasol ointment. I spoke with Anderson Malta. Jenn wonders if cream would be cheaper. I called [facility] [address] Leveda Anna is not covered either. Anderson Malta recommends Betamethasone oint,  apply twice daily for 4 weeks, then apply twice weekly. Hilda

## 2020-12-03 LAB — HERPES SIMPLEX VIRUS CULTURE

## 2020-12-04 NOTE — Progress Notes (Signed)
Sent via mychart

## 2020-12-14 ENCOUNTER — Ambulatory Visit: Payer: Medicare Other | Admitting: Obstetrics & Gynecology

## 2020-12-19 ENCOUNTER — Telehealth: Payer: Self-pay | Admitting: Adult Health

## 2020-12-19 NOTE — Telephone Encounter (Signed)
Left message I called 

## 2020-12-19 NOTE — Telephone Encounter (Signed)
Pt requesting a call back from [place], would like to let her know some things that are happening since she started the Estrogen & to get advise on whether or not she should continue to take it  Please advise & call pt

## 2020-12-22 ENCOUNTER — Telehealth: Payer: Self-pay

## 2020-12-22 NOTE — Telephone Encounter (Signed)
Patient left a Voice Mail:  Wants a nurse call her back.  Call back:  [phone] (Home)  Thanks, Helene Kelp

## 2020-12-22 NOTE — Telephone Encounter (Signed)
Returned call- pt reported Dr. Elonda Husky placed patient of estrace cream but patient was not able to tolerate.  Pt asking for medication to help with "irritation" and reports her flow is going " backwards" reports stream is fine during the day.

## 2020-12-25 ENCOUNTER — Other Ambulatory Visit: Payer: Self-pay | Admitting: *Deleted

## 2020-12-25 ENCOUNTER — Ambulatory Visit: Payer: Medicare Other | Admitting: Internal Medicine

## 2020-12-25 ENCOUNTER — Other Ambulatory Visit: Payer: Self-pay | Admitting: Internal Medicine

## 2020-12-25 ENCOUNTER — Telehealth: Payer: Self-pay

## 2020-12-25 DIAGNOSIS — F419 Anxiety disorder, unspecified: Secondary | ICD-10-CM

## 2020-12-25 NOTE — Telephone Encounter (Signed)
Patient called need med   LORazepam (ATIVAN) 1 MG tablet  Pharmacy: [facility]

## 2020-12-25 NOTE — Telephone Encounter (Signed)
This medication has been sent to pt pharmacy  

## 2020-12-26 ENCOUNTER — Other Ambulatory Visit: Payer: Self-pay | Admitting: Internal Medicine

## 2020-12-26 ENCOUNTER — Other Ambulatory Visit: Payer: Self-pay | Admitting: Family Medicine

## 2020-12-26 DIAGNOSIS — F419 Anxiety disorder, unspecified: Secondary | ICD-10-CM

## 2020-12-27 ENCOUNTER — Telehealth: Payer: Self-pay | Admitting: Internal Medicine

## 2020-12-27 ENCOUNTER — Other Ambulatory Visit: Payer: Self-pay | Admitting: Internal Medicine

## 2020-12-27 DIAGNOSIS — F419 Anxiety disorder, unspecified: Secondary | ICD-10-CM

## 2020-12-27 NOTE — Telephone Encounter (Signed)
error 

## 2020-12-27 NOTE — Telephone Encounter (Signed)
Please call in [place] to [place] [address]

## 2020-12-27 NOTE — Telephone Encounter (Signed)
This was sent in today 12-27-20

## 2020-12-28 ENCOUNTER — Other Ambulatory Visit: Payer: Self-pay

## 2020-12-28 DIAGNOSIS — F419 Anxiety disorder, unspecified: Secondary | ICD-10-CM

## 2020-12-28 MED ORDER — LORAZEPAM 1 MG PO TABS: ORAL_TABLET | ORAL | 0 refills | Status: DC

## 2021-01-08 ENCOUNTER — Ambulatory Visit (INDEPENDENT_AMBULATORY_CARE_PROVIDER_SITE_OTHER): Payer: Medicare Other | Admitting: Gastroenterology

## 2021-01-10 MED ORDER — TAMSULOSIN HCL 0.4 MG PO CAPS: 0.4000 mg | ORAL_CAPSULE | Freq: Every day | ORAL | 5 refills | Status: DC

## 2021-01-10 NOTE — Telephone Encounter (Signed)
Mychart message sent.

## 2021-01-16 ENCOUNTER — Ambulatory Visit (INDEPENDENT_AMBULATORY_CARE_PROVIDER_SITE_OTHER): Payer: Medicare Other | Admitting: Internal Medicine

## 2021-01-16 ENCOUNTER — Encounter: Payer: Self-pay | Admitting: Internal Medicine

## 2021-01-16 ENCOUNTER — Other Ambulatory Visit: Payer: Self-pay

## 2021-01-16 VITALS — BP 98/68 | HR 92 | Temp 98.4°F | Resp 18 | Ht 67.0 in | Wt 117.1 lb

## 2021-01-16 DIAGNOSIS — G5603 Carpal tunnel syndrome, bilateral upper limbs: Secondary | ICD-10-CM | POA: Diagnosis not present

## 2021-01-16 DIAGNOSIS — N952 Postmenopausal atrophic vaginitis: Secondary | ICD-10-CM | POA: Diagnosis not present

## 2021-01-16 DIAGNOSIS — E559 Vitamin D deficiency, unspecified: Secondary | ICD-10-CM

## 2021-01-16 DIAGNOSIS — F419 Anxiety disorder, unspecified: Secondary | ICD-10-CM | POA: Diagnosis not present

## 2021-01-16 NOTE — Assessment & Plan Note (Signed)
Followed by Ob/Gyn and Urology Has tried multiple different vaginal creams in the past with minimal benefit Did not tolerate Estrace cream

## 2021-01-16 NOTE — Assessment & Plan Note (Addendum)
Followed by Neurology Gets steroid injections Wrist splint advised 

## 2021-01-16 NOTE — Patient Instructions (Addendum)
Please continue taking medications as prescribed.  You are being referred to Behavioral health therapist for anxiety.  Please consider getting following vaccines at your local pharmacy: -PCV13 -Shingrix

## 2021-01-16 NOTE — Progress Notes (Signed)
Established Patient Office Visit  Subjective:  Patient ID: Allison Dickerson, female    DOB: Jun 25, 1955  Age: 66 y.o. MRN: [id number]  CC:  Chief Complaint  Patient presents with   Follow-up    3 month follow up     HPI Allison Dickerson is a 65 year old female with past medical history of GERD, polyarthritis, severe anxiety due to caregiver stress and pulmonary nodule who presents for follow up of her chronic medical conditions.   She takes Ativan chronically for her severe anxiety.  She takes care of her mother, who is bedbound and has severe dementia.  She feels stressed as she is taking care of her mother. She takes the medications regularly. She denies depressed mood, change in appetite or weight, change in activity or suicidal or homicidal ideation. She did not tolerate Wellbutrin and Buspar for anxiety.  She has been taking Vitamin D supplements for Vitamin D deficiency.  She has chronic vaginal area irritation, for which she was given Estrace cream, which she did not tolerate. She states that her symptoms are intermittent, and does not want any medication for it currently.   Past Medical History:  Diagnosis Date   Anxiety    Arthritis    Phreesia 06/24/2020   Cervical cancer ([place])    When younger.   Diverticulitis    Pinched nerve    right hand    Past Surgical History:  Procedure Laterality Date   ABDOMINAL HYSTERECTOMY     BIOPSY  10/24/2020   Procedure: BIOPSY;  Surgeon: Montez Morita, Quillian Quince, MD;  [facility];  Service: Gastroenterology;;   cervical cancer removal     COLONOSCOPY WITH PROPOFOL N/A 10/24/2020   Procedure: COLONOSCOPY WITH PROPOFOL;  Surgeon: Harvel Quale, MD;  [facility];  Service: Gastroenterology;  Laterality: N/A;  9:00 am   ESOPHAGOGASTRODUODENOSCOPY (EGD) WITH PROPOFOL N/A 10/24/2020   Procedure: ESOPHAGOGASTRODUODENOSCOPY (EGD) WITH PROPOFOL;  Surgeon: Harvel Quale, MD;  [facility];  Service: Gastroenterology;  Laterality: N/A;   EYE SURGERY N/A    Phreesia 06/24/2020   POLYPECTOMY  10/24/2020   Procedure: POLYPECTOMY;  Surgeon: Montez Morita, Quillian Quince, MD;  [facility];  Service: Gastroenterology;;    Family History  Problem Relation Age of Onset   Cancer Father    Alcohol abuse Sister    Cancer Other     Social History   Socioeconomic History   Marital status: Married    Spouse name: Not on file   Number of children: Not on file   Years of education: Not on file   Highest education level: Not on file  Occupational History   Occupation: retired  Tobacco Use   Smoking status: Former    Packs/day: 0.50    Years: 20.00    Pack years: 10.00    Types: Cigarettes   Smokeless tobacco: Never  Vaping Use   Vaping Use: Never used  Substance and Sexual Activity   Alcohol use: No   Drug use: Yes    Types: Marijuana    Comment: occ with gummies   Sexual activity: Not Currently    Birth control/protection: Surgical    Comment: hyst  Other Topics Concern   Not on file  Social History Narrative   Not on file   Social Determinants of Health   Financial Resource Strain: Low Risk    Difficulty of Paying Living Expenses: Not hard at all  Food Insecurity: No Food Insecurity  Worried About Charity fundraiser in the Last Year: Never true   [place] in the Last Year: Never true  Transportation Needs: No Transportation Needs   Lack of Transportation (Medical): No   Lack of Transportation (Non-Medical): No  Physical Activity: Sufficiently Active   Days of Exercise per Week: 7 days   Minutes of Exercise per Session: 60 min  Stress: Stress Concern Present   Feeling of Stress : To some extent  Social Connections: Socially Isolated   Frequency of Communication with Friends and Family: Once a week   Frequency of Social Gatherings with Friends and Family: Never   Attends Religious Services: Never   Engineer, materials: No   Attends Music therapist: Never   Marital Status: Married  Human resources officer Violence: Not At Risk   Fear of Current or Ex-Partner: No   Emotionally Abused: No   Physically Abused: No   Sexually Abused: No    Outpatient Medications Prior to Visit  Medication Sig Dispense Refill   acetaminophen (TYLENOL) 500 MG tablet Take 500 mg by mouth every 8 (eight) hours as needed for mild pain or headache.     LORazepam (ATIVAN) 1 MG tablet TAKE 1 TABLET(1 MG) BY MOUTH EVERY 8 HOURS AS NEEDED FOR ANXIETY 90 tablet 0   Multiple Vitamin (MULTIVITAMIN WITH MINERALS) TABS tablet Take 1 tablet by mouth daily.     Vitamin D, Ergocalciferol, (DRISDOL) 1.25 MG (50000 UNIT) CAPS capsule Take 1 capsule (50,000 Units total) by mouth every 7 (seven) days. 5 capsule 5   clobetasol ointment (TEMOVATE) 0.05 % Use 2 x daily for 4 weeks then 2 x weekly (Patient not taking: Reported on 01/16/2021) 30 g 3   estradiol (ESTRACE VAGINAL) 0.1 MG/GM vaginal cream Use 0.5 gm in vagina at bedtime daily for 2 weeks then 2-3 x weekly (Patient not taking: Reported on 01/16/2021) 42.5 g 2   ibuprofen (ADVIL) 200 MG tablet Take 200 mg by mouth as needed. (Patient not taking: Reported on 01/16/2021)     tamsulosin (FLOMAX) 0.4 MG CAPS capsule Take 1 capsule (0.4 mg total) by mouth daily. (Patient not taking: Reported on 01/16/2021) 30 capsule 5   No facility-administered medications prior to visit.    No Known Allergies  ROS Review of Systems  Constitutional:  Negative for chills and fever.  HENT:  Negative for congestion, sinus pressure, sinus pain and sore throat.   Eyes:  Negative for pain and discharge.  Respiratory:  Negative for cough and shortness of breath.   Cardiovascular:  Negative for chest pain and palpitations.  Gastrointestinal:  Positive for diarrhea. Negative for abdominal pain, constipation, nausea and vomiting.  Endocrine: Negative for polydipsia and polyuria.  Genitourinary:   Negative for dysuria and hematuria.  Musculoskeletal:  Negative for neck pain and neck stiffness.  Skin:  Negative for rash.  Neurological:  Negative for dizziness, speech difficulty, weakness and numbness.  Psychiatric/Behavioral:  Positive for sleep disturbance. Negative for agitation, behavioral problems, dysphoric mood and suicidal ideas. The patient is nervous/anxious.      Objective:    Physical Exam Vitals reviewed.  Constitutional:      General: She is not in acute distress.    Appearance: She is not diaphoretic.  HENT:     Head: Normocephalic and atraumatic.     Nose: Nose normal.     Mouth/Throat:     Mouth: Mucous membranes are moist.  Eyes:  General: No scleral icterus.    Extraocular Movements: Extraocular movements intact.     Pupils: Pupils are equal, round, and reactive to light.  Cardiovascular:     Rate and Rhythm: Normal rate and regular rhythm.     Pulses: Normal pulses.     Heart sounds: Normal heart sounds. No murmur heard. Pulmonary:     Breath sounds: Normal breath sounds. No wheezing or rales.  Abdominal:     Palpations: Abdomen is soft.     Tenderness: There is no abdominal tenderness.  Musculoskeletal:     Cervical back: Neck supple. No tenderness.     Right lower leg: No edema.     Left lower leg: No edema.  Skin:    General: Skin is warm.     Findings: No rash.  Neurological:     General: No focal deficit present.     Mental Status: She is alert and oriented to person, place, and time.     Sensory: No sensory deficit.     Motor: No weakness.  Psychiatric:        Mood and Affect: Mood normal.        Behavior: Behavior normal.    BP 98/68 (BP Location: Left Arm, Patient Position: Sitting, Cuff Size: Normal)   Pulse 92   Temp 98.4 F (36.9 C) (Oral)   Resp 18   Ht '5\' 7"'  (1.702 m)   Wt 117 lb 1.9 oz (53.1 kg)   SpO2 97%   BMI 18.34 kg/m  Wt Readings from Last 3 Encounters:  01/16/21 117 lb 1.9 oz (53.1 kg)  11/29/20 118 lb (53.5  kg)  10/24/20 120 lb (54.4 kg)     Health Maintenance Due  Topic Date Due   COVID-19 Vaccine (1) Never done   MAMMOGRAM  Never done   Zoster Vaccines- Shingrix (1 of 2) Never done   DEXA SCAN  Never done   PNA vac Low Risk Adult (1 of 2 - PCV13) 03/15/2019   INFLUENZA VACCINE  01/15/2021    There are no preventive care reminders to display for this patient.  Lab Results  Component Value Date   TSH 1.860 09/20/2020   Lab Results  Component Value Date   WBC 6.1 09/20/2020   HGB 14.0 09/20/2020   HCT 41.1 09/20/2020   MCV 91 09/20/2020   PLT 278 09/20/2020   Lab Results  Component Value Date   NA 141 09/20/2020   K 4.0 09/20/2020   CO2 18 (L) 09/20/2020   GLUCOSE 107 (H) 09/20/2020   BUN 23 09/20/2020   CREATININE 0.97 09/20/2020   BILITOT 0.5 09/20/2020   ALKPHOS 63 09/20/2020   AST 16 09/20/2020   ALT 14 09/20/2020   PROT 7.4 09/20/2020   ALBUMIN 4.4 09/20/2020   CALCIUM 9.9 09/20/2020   ANIONGAP 10 01/13/2020   EGFR 64 09/20/2020   Lab Results  Component Value Date   CHOL 250 (H) 09/20/2020   Lab Results  Component Value Date   HDL 83 09/20/2020   Lab Results  Component Value Date   LDLCALC 146 (H) 09/20/2020   Lab Results  Component Value Date   TRIG 122 09/20/2020   Lab Results  Component Value Date   CHOLHDL 3.0 09/20/2020   Lab Results  Component Value Date   HGBA1C 5.8 (H) 09/20/2020      Assessment & Plan:   Problem List Items Addressed This Visit       Nervous and Auditory  Bilateral carpal tunnel syndrome    Followed by Neurology Gets steroid injections Wrist splint advised         Genitourinary   Vaginal atrophy    Followed by Ob/Gyn and Urology Has tried multiple different vaginal creams in the past with minimal benefit Did not tolerate Estrace cream         Other   Anxiety - Primary    Has caregiver stress as well On Lorazepam 1 mg TID, has been on Benzodiazepine for more than 15 years Did not tolerate  Wellbutrin in the past with her previous PCP Did not like Buspar Discussed about Psychiatry referral - referred to [facility] therapist for now       Relevant Orders   Ambulatory referral to Psychiatry   Vitamin D deficiency    Last vitamin D Lab Results  Component Value Date   VD25OH 15.1 (L) 09/20/2020  Continue Vitamin D 50,000 IU once weekly       No orders of the defined types were placed in this encounter.   Follow-up: Return in about 3 months (around 04/18/2021) for Anxiety and Vitamin D deficiency.    Lindell Spar, MD

## 2021-01-16 NOTE — Assessment & Plan Note (Signed)
Has caregiver stress as well On Lorazepam 1 mg TID, has been on Benzodiazepine for more than 15 years Did not tolerate Wellbutrin in the past with her previous PCP Did not like Buspar Discussed about Psychiatry referral - referred to [facility] therapist for now

## 2021-01-16 NOTE — Assessment & Plan Note (Signed)
Last vitamin D Lab Results  Component Value Date   VD25OH 15.1 (L) 09/20/2020   Continue Vitamin D 50,000 IU once weekly

## 2021-01-24 ENCOUNTER — Other Ambulatory Visit: Payer: Self-pay | Admitting: Internal Medicine

## 2021-01-24 ENCOUNTER — Telehealth: Payer: Self-pay

## 2021-01-24 ENCOUNTER — Telehealth: Payer: Self-pay | Admitting: Internal Medicine

## 2021-01-24 DIAGNOSIS — F419 Anxiety disorder, unspecified: Secondary | ICD-10-CM

## 2021-01-24 MED ORDER — LORAZEPAM 1 MG PO TABS: ORAL_TABLET | ORAL | 2 refills | Status: DC

## 2021-01-24 NOTE — Telephone Encounter (Signed)
Lmom to call to set up appt with [facility]

## 2021-01-24 NOTE — Telephone Encounter (Signed)
Please send Lorazepam to the pharmacy

## 2021-02-06 ENCOUNTER — Encounter (HOSPITAL_COMMUNITY): Payer: Self-pay | Admitting: *Deleted

## 2021-02-06 ENCOUNTER — Other Ambulatory Visit: Payer: Self-pay

## 2021-02-06 ENCOUNTER — Emergency Department (HOSPITAL_COMMUNITY)
Admission: EM | Admit: 2021-02-06 | Discharge: 2021-02-06 | Disposition: A | Discharge status: Home / Self Care | Payer: Medicare Other | Attending: Emergency Medicine | Admitting: Emergency Medicine

## 2021-02-06 DIAGNOSIS — Z8541 Personal history of malignant neoplasm of cervix uteri: Secondary | ICD-10-CM | POA: Insufficient documentation

## 2021-02-06 DIAGNOSIS — B9689 Other specified bacterial agents as the cause of diseases classified elsewhere: Secondary | ICD-10-CM | POA: Diagnosis not present

## 2021-02-06 DIAGNOSIS — K219 Gastro-esophageal reflux disease without esophagitis: Secondary | ICD-10-CM | POA: Insufficient documentation

## 2021-02-06 DIAGNOSIS — Z87891 Personal history of nicotine dependence: Secondary | ICD-10-CM | POA: Diagnosis not present

## 2021-02-06 DIAGNOSIS — N390 Urinary tract infection, site not specified: Secondary | ICD-10-CM | POA: Diagnosis not present

## 2021-02-06 DIAGNOSIS — R1030 Lower abdominal pain, unspecified: Secondary | ICD-10-CM | POA: Diagnosis present

## 2021-02-06 LAB — URINALYSIS, ROUTINE W REFLEX MICROSCOPIC
Bilirubin Urine: NEGATIVE
Glucose, UA: NEGATIVE mg/dL
Ketones, ur: 20 mg/dL — AB
Nitrite: POSITIVE — AB
Protein, ur: NEGATIVE mg/dL
Specific Gravity, Urine: 1.014 (ref 1.005–1.030)
WBC, UA: >50 WBC/hpf — ABNORMAL HIGH (ref 0–5)
pH: 5 (ref 5.0–8.0)

## 2021-02-06 LAB — CBC
HCT: 37.9 % (ref 36.0–46.0)
Hemoglobin: 12.7 g/dL (ref 12.0–15.0)
MCH: 31.3 pg (ref 26.0–34.0)
MCHC: 33.5 g/dL (ref 30.0–36.0)
MCV: 93.3 fL (ref 80.0–100.0)
Platelets: 242 10*3/uL (ref 150–400)
RBC: 4.06 MIL/uL (ref 3.87–5.11)
RDW: 12.5 % (ref 11.5–15.5)
WBC: 9.2 10*3/uL (ref 4.0–10.5)
nRBC: 0 % (ref 0.0–0.2)

## 2021-02-06 LAB — BASIC METABOLIC PANEL
Anion gap: 8 (ref 5–15)
BUN: 20 mg/dL (ref 8–23)
CO2: 21 mmol/L — ABNORMAL LOW (ref 22–32)
Calcium: 9.2 mg/dL (ref 8.9–10.3)
Chloride: 108 mmol/L (ref 98–111)
Creatinine, Ser: 0.77 mg/dL (ref 0.44–1.00)
GFR, Estimated: >60 mL/min (ref >60)
Glucose, Bld: 92 mg/dL (ref 70–99)
Potassium: 3.8 mmol/L (ref 3.5–5.1)
Sodium: 137 mmol/L (ref 135–145)

## 2021-02-06 MED ORDER — CEPHALEXIN 500 MG PO CAPS: 500.0000 mg | ORAL_CAPSULE | Freq: Two times a day (BID) | ORAL | 0 refills | Status: AC

## 2021-02-06 MED ORDER — SODIUM CHLORIDE 0.9 % IV SOLN
1.0000 g | Freq: Once | INTRAVENOUS | Status: AC
  Administered 2021-02-06: 1 g via INTRAVENOUS
  Filled 2021-02-06: qty 10

## 2021-02-06 MED ORDER — PHENAZOPYRIDINE HCL 200 MG PO TABS: 200.0000 mg | ORAL_TABLET | Freq: Three times a day (TID) | ORAL | 0 refills | Status: DC

## 2021-02-06 NOTE — Discharge Instructions (Addendum)
Please follow-up with the urologist regarding recurrent urinary tract infections

## 2021-02-06 NOTE — ED Notes (Signed)
Pt in bathroom attempting sample.

## 2021-02-06 NOTE — ED Provider Notes (Signed)
[facility] EMERGENCY DEPARTMENT Provider Note   CSN: [id number] Arrival date & time: 02/06/21  1003     History Chief Complaint  Patient presents with  . Urinary Tract Infection    Allison Dickerson is a 65 y.o. female.  Six 39-year-old female history of recurrent urinary tract infection, vaginal atrophy presents to the ER secondary to concern for recurrent urinary tract infection.  Patient with onset of symptoms 2 evenings ago, dysuria, frequency, suprapubic discomfort.  Feels similar to prior urinary infections.  No fevers or chills.  No nausea or vomiting.  She time p.o. without difficulty.  No change in bowel function.  No abnormal vaginal bleeding or discharge.  Reports some ongoing irritation to her vulva which she follows with OB/GYN for.  Not acutely changed in the past few days. No hematuria or flank pain  The history is provided by the patient. No language interpreter was used.  Urinary Tract Infection Associated symptoms: abdominal pain   Associated symptoms: no fever, no nausea and no vomiting       Past Medical History:  Diagnosis Date  . Anxiety   . Arthritis    Phreesia 06/24/2020  . Cervical cancer ([place])    When younger.  . Diverticulitis   . Pinched nerve    right hand    Patient Active Problem List   Diagnosis Date Noted  . Urethral caruncle 11/29/2020  . Lichen sclerosus [id number]  . Vaginal atrophy 11/29/2020  . IBS (irritable bowel syndrome) 10/05/2020  . Chronic diarrhea 10/05/2020  . Prediabetes 09/25/2020  . Vitamin D deficiency 09/25/2020  . Mixed hyperlipidemia 09/25/2020  . Diarrhea 06/28/2020  . Pulmonary nodule 06/28/2020  . Bilateral carpal tunnel syndrome 06/27/2020  . Hand joint pain 06/27/2020  . Lesion of ulnar nerve 06/27/2020  . Polyarthropathy 06/27/2020  . GERD (gastroesophageal reflux disease) 06/27/2020  . OAB (overactive bladder) 05/08/2020  . Anxiety 02/18/2017    Past Surgical History:  Procedure Laterality Date  .  ABDOMINAL HYSTERECTOMY    . BIOPSY  10/24/2020   Procedure: BIOPSY;  Surgeon: Montez Morita, Quillian Quince, MD;  [facility];  Service: Gastroenterology;;  . cervical cancer removal    . COLONOSCOPY WITH PROPOFOL N/A 10/24/2020   Procedure: COLONOSCOPY WITH PROPOFOL;  Surgeon: Harvel Quale, MD;  [facility];  Service: Gastroenterology;  Laterality: N/A;  9:00 am  . ESOPHAGOGASTRODUODENOSCOPY (EGD) WITH PROPOFOL N/A 10/24/2020   Procedure: ESOPHAGOGASTRODUODENOSCOPY (EGD) WITH PROPOFOL;  Surgeon: Harvel Quale, MD;  [facility];  Service: Gastroenterology;  Laterality: N/A;  . EYE SURGERY N/A    Phreesia 06/24/2020  . POLYPECTOMY  10/24/2020   Procedure: POLYPECTOMY;  Surgeon: Harvel Quale, MD;  [facility];  Service: Gastroenterology;;     OB History     Gravida  3   Para  1   Term  1   Preterm      AB  2   Living  1      SAB      IAB      Ectopic      Multiple      Live Births              Family History  Problem Relation Age of Onset  . Cancer Father   . Alcohol abuse Sister   . Cancer Other     Social History   Tobacco Use  . Smoking status: Former    Packs/day: 0.50  Years: 20.00    Pack years: 10.00    Types: Cigarettes  . Smokeless tobacco: Never  Vaping Use  . Vaping Use: Never used  Substance Use Topics  . Alcohol use: No  . Drug use: Yes    Types: Marijuana    Comment: occ with gummies    Home Medications Prior to Admission medications   Medication Sig Start Date End Date Taking? Authorizing Provider  acetaminophen (TYLENOL) 500 MG tablet Take 500 mg by mouth every 8 (eight) hours as needed for mild pain or headache.   Yes [provider]  cephALEXin (KEFLEX) 500 MG capsule Take 1 capsule (500 mg total) by mouth 2 (two) times daily for 7 days. 02/06/21 02/13/21 Yes Wynona Dove A, DO  Cyanocobalamin (VITAMIN B-12 PO) Take 1 tablet by mouth daily.    Yes [provider]  LORazepam (ATIVAN) 1 MG tablet TAKE 1 TABLET(1 MG) BY MOUTH EVERY 8 HOURS AS NEEDED FOR ANXIETY 01/24/21  Yes Lindell Spar, MD  Multiple Vitamin (MULTIVITAMIN WITH MINERALS) TABS tablet Take 1 tablet by mouth daily.   Yes [provider]  phenazopyridine (PYRIDIUM) 200 MG tablet Take 1 tablet (200 mg total) by mouth 3 (three) times daily. 02/06/21  Yes Jeanell Sparrow, DO  Vitamin D, Ergocalciferol, (DRISDOL) 1.25 MG (50000 UNIT) CAPS capsule Take 1 capsule (50,000 Units total) by mouth every 7 (seven) days. 09/25/20  Yes Lindell Spar, MD  betamethasone dipropionate 0.05 % cream betamethasone dipropionate 0.05 % topical cream Patient not taking: No sig reported    [provider]  estradiol (ESTRACE) 0.1 MG/GM vaginal cream estradiol 0.01% (0.1 mg/gram) vaginal cream  USE 0.5 GRAM VAGINALLY DAILY AT BEDTIME FOR 2 WEEKS THEN USE VAGINALLY 2 TO 3 TIMES WEEKLY Patient not taking: No sig reported    [provider]  nitrofurantoin, macrocrystal-monohydrate, (MACROBID) 100 MG capsule nitrofurantoin monohydrate/macrocrystals 100 mg capsule Patient not taking: No sig reported    [provider]  tamsulosin (FLOMAX) 0.4 MG CAPS capsule tamsulosin 0.4 mg capsule Patient not taking: No sig reported    [provider]    Allergies    Patient has no known allergies.  Review of Systems   Review of Systems  Constitutional:  Negative for chills and fever.  HENT:  Negative for facial swelling and trouble swallowing.   Eyes:  Negative for photophobia and visual disturbance.  Respiratory:  Negative for cough and shortness of breath.   Cardiovascular:  Negative for chest pain and palpitations.  Gastrointestinal:  Positive for abdominal pain. Negative for nausea and vomiting.  Endocrine: Negative for polydipsia and polyuria.  Genitourinary:  Positive for dysuria, frequency and vaginal pain. Negative for difficulty urinating and  hematuria.  Musculoskeletal:  Negative for gait problem and joint swelling.  Skin:  Negative for pallor and rash.  Neurological:  Negative for syncope and headaches.  Psychiatric/Behavioral:  Negative for agitation and confusion.    Physical Exam Updated Vital Signs BP 109/72   Pulse 82   Temp 98.2 F (36.8 C) (Oral)   Resp 17   SpO2 100%   Physical Exam Vitals and nursing note reviewed. Exam conducted with a chaperone present.  Constitutional:      General: She is not in acute distress.    Appearance: Normal appearance.  HENT:     Head: Normocephalic and atraumatic.     Right Ear: External ear normal.     Left Ear: External ear normal.     Nose:  Nose normal.     Mouth/Throat:     Mouth: Mucous membranes are moist.  Eyes:     General: No scleral icterus.       Right eye: No discharge.        Left eye: No discharge.  Cardiovascular:     Rate and Rhythm: Normal rate and regular rhythm.     Pulses: Normal pulses.     Heart sounds: Normal heart sounds.  Pulmonary:     Effort: Pulmonary effort is normal. No respiratory distress.     Breath sounds: Normal breath sounds.  Abdominal:     General: Abdomen is flat.     Tenderness: There is abdominal tenderness in the suprapubic area.     Comments: Minimal suprapubic tenderness, not peritoneal   Genitourinary:    Comments: Findings consistent with atrophic vaginitis Musculoskeletal:        General: Normal range of motion.     Cervical back: Normal range of motion.     Right lower leg: No edema.     Left lower leg: No edema.  Skin:    General: Skin is warm and dry.     Capillary Refill: Capillary refill takes less than 2 seconds.  Neurological:     Mental Status: She is alert.  Psychiatric:        Mood and Affect: Mood normal.        Behavior: Behavior normal.    ED Results / Procedures / Treatments   Labs (all labs ordered are listed, but only abnormal results are displayed) Labs Reviewed  URINALYSIS, ROUTINE W  REFLEX MICROSCOPIC - Abnormal; Notable for the following components:      Result Value   APPearance CLOUDY (*)    Hgb urine dipstick MODERATE (*)    Ketones, ur 20 (*)    Nitrite POSITIVE (*)    Leukocytes,Ua LARGE (*)    WBC, UA >50 (*)    Bacteria, UA RARE (*)    All other components within normal limits  BASIC METABOLIC PANEL - Abnormal; Notable for the following components:   CO2 21 (*)    All other components within normal limits  URINE CULTURE  CBC    EKG None  Radiology No results found.  Procedures Procedures   Medications Ordered in ED Medications  cefTRIAXone (ROCEPHIN) 1 g in sodium chloride 0.9 % 100 mL IVPB (1 g Intravenous New Bag/Given 02/06/21 1527)    ED Course  I have reviewed the triage vital signs and the nursing notes.  Pertinent labs & imaging results that were available during my care of the patient were reviewed by me and considered in my medical decision making (see chart for details).    MDM Rules/Calculators/A&P                         This patient complains of dysuria; this involves an extensive number of treatment Options and is a complaint that carries with it a high risk of complications and Morbidity.  Vital signs reviewed and are stable.  Serious etiologies considered.   I ordered, reviewed and interpreted labs, which included  CBC and BMP reviewed, non acute  I ordered medication rocephin   Previous records obtained and reviewed   Urinalysis consistent with acute UTI.  Previous urine cultures reviewed.  No history of ESBL.  Recurrent UTI, typically E. coli.  Patient given Rocephin in the ER.  Keflex p.o.  Tolerant oral intake without difficulty.  Suspicion for pyelonephritis is very low.  Suspicion for nephrolithiasis is low as well.  Patient given Keflex, Pyridium.  Vies she follow-up with the urologist in the next few days regarding recurrent UTI.  Discussed hygiene precautions to avoid future UTIs.  The patient improved  significantly and was discharged in stable condition. Detailed discussions were had with the patient regarding current findings, and need for close f/u with PCP or on call doctor. The patient has been instructed to return immediately if the symptoms worsen in any way for re-evaluation. Patient verbalized understanding and is in agreement with current care plan. All questions answered prior to discharge.   Final Clinical Impression(s) / ED Diagnoses Final diagnoses:  Lower urinary tract infectious disease    Rx / DC Orders ED Discharge Orders          Ordered    cephALEXin (KEFLEX) 500 MG capsule  2 times daily        02/06/21 1603    phenazopyridine (PYRIDIUM) 200 MG tablet  3 times daily        02/06/21 1603             Jeanell Sparrow, DO 02/06/21 1606

## 2021-02-06 NOTE — ED Triage Notes (Signed)
Vaginal swelling with recurrent UTI

## 2021-02-09 LAB — URINE CULTURE: Culture: >=100000 — AB

## 2021-02-10 ENCOUNTER — Telehealth: Payer: Self-pay | Admitting: Emergency Medicine

## 2021-02-10 NOTE — Telephone Encounter (Unsigned)
Post ED Visit - Positive Culture Follow-up  Culture report reviewed by antimicrobial stewardship pharmacist: [place] Team '[]'$  Elenor Quinones, Pharm.D. '[]'$  Heide Guile, Pharm.D., BCPS AQ-ID '[]'$  Parks Neptune, Pharm.D., BCPS '[]'$  Alycia Rossetti, Pharm.D., BCPS '[]'$  [place], Pharm.D., BCPS, AAHIVP '[]'$  Legrand Como, Pharm.D., BCPS, AAHIVP '[]'$  Salome Arnt, PharmD, BCPS '[]'$  Johnnette Gourd, PharmD, BCPS '[]'$  Hughes Better, PharmD, BCPS '[x]'$  Lorelei Pont, PharmD '[]'$  Laqueta Linden, PharmD, BCPS '[]'$  Albertina Parr, PharmD  [place] Team '[]'$  Leodis Sias, PharmD '[]'$  Lindell Spar, PharmD '[]'$  Royetta Asal, PharmD '[]'$  Graylin Shiver, Rph '[]'$  Rema Fendt) Glennon Mac, PharmD '[]'$  Arlyn Dunning, PharmD '[]'$  Netta Cedars, PharmD '[]'$  Dia Sitter, PharmD '[]'$  Leone Haven, PharmD '[]'$  Gretta Arab, PharmD '[]'$  Theodis Shove, PharmD '[]'$  Peggyann Juba, PharmD '[]'$  Reuel Boom, PharmD   Positive urine culture Treated with Cephalexin, organism sensitive to the same and no further patient follow-up is required at this time.  Allison Dickerson 02/10/2021, 2:41 PM

## 2021-03-09 ENCOUNTER — Telehealth: Payer: Self-pay

## 2021-03-09 NOTE — Telephone Encounter (Signed)
Patient returning Allison Dickerson called said if you will give patient a call regarding medicare awv not sure when it is a good time to setup. Best day to call patient back is on Monday, Thursday or Friday in the afternoon.  She would like to talk to you. Call back # [phone].

## 2021-03-13 NOTE — Telephone Encounter (Signed)
NA NVM to schedule

## 2021-04-17 ENCOUNTER — Telehealth: Payer: Self-pay | Admitting: Internal Medicine

## 2021-04-17 ENCOUNTER — Ambulatory Visit (INDEPENDENT_AMBULATORY_CARE_PROVIDER_SITE_OTHER): Payer: Medicare Other | Admitting: Internal Medicine

## 2021-04-17 ENCOUNTER — Ambulatory Visit (INDEPENDENT_AMBULATORY_CARE_PROVIDER_SITE_OTHER): Payer: Medicare Other

## 2021-04-17 ENCOUNTER — Encounter: Payer: Self-pay | Admitting: Internal Medicine

## 2021-04-17 ENCOUNTER — Other Ambulatory Visit: Payer: Self-pay

## 2021-04-17 VITALS — BP 137/76 | HR 102 | Resp 18 | Ht 67.0 in | Wt 115.0 lb

## 2021-04-17 DIAGNOSIS — R7303 Prediabetes: Secondary | ICD-10-CM | POA: Diagnosis not present

## 2021-04-17 DIAGNOSIS — K219 Gastro-esophageal reflux disease without esophagitis: Secondary | ICD-10-CM | POA: Diagnosis not present

## 2021-04-17 DIAGNOSIS — F419 Anxiety disorder, unspecified: Secondary | ICD-10-CM | POA: Diagnosis not present

## 2021-04-17 DIAGNOSIS — E559 Vitamin D deficiency, unspecified: Secondary | ICD-10-CM | POA: Diagnosis not present

## 2021-04-17 DIAGNOSIS — Z0000 Encounter for general adult medical examination without abnormal findings: Secondary | ICD-10-CM | POA: Diagnosis not present

## 2021-04-17 DIAGNOSIS — Z2821 Immunization not carried out because of patient refusal: Secondary | ICD-10-CM

## 2021-04-17 DIAGNOSIS — K580 Irritable bowel syndrome with diarrhea: Secondary | ICD-10-CM

## 2021-04-17 MED ORDER — LORAZEPAM 1 MG PO TABS: ORAL_TABLET | ORAL | 2 refills | Status: DC

## 2021-04-17 MED ORDER — PANTOPRAZOLE SODIUM 40 MG PO TBEC: 40.0000 mg | DELAYED_RELEASE_TABLET | Freq: Every day | ORAL | 3 refills | Status: DC

## 2021-04-17 NOTE — Telephone Encounter (Signed)
Please schedule AWV for today with [facility] phone

## 2021-04-17 NOTE — Telephone Encounter (Signed)
Pt called to return phone call. Pt apologizing she did not want to seem rude or the call drop.

## 2021-04-17 NOTE — Assessment & Plan Note (Signed)
Lab Results  Component Value Date   HGBA1C 5.8 (H) 09/20/2020   Advised to follow low carb diet DASH diet material provided

## 2021-04-17 NOTE — Assessment & Plan Note (Signed)
Tried Bentyl and Levsin, did not provide relief Has been trying diet modification Followed by GI

## 2021-04-17 NOTE — Assessment & Plan Note (Signed)
Does not take Omeprazole as she thinks it made her bloating worse Will give trial of Pantoprazole for now

## 2021-04-17 NOTE — Progress Notes (Signed)
Subjective:   Allison Dickerson is a 65 y.o. female who presents for Medicare Annual (Subsequent) preventive examination.   I connected with  NISHA DHAMI on 04/17/21 by a audio enabled telemedicine application and verified that I am speaking with the correct person using two identifiers.   I discussed the limitations, risks, security and privacy concerns of performing an evaluation and management service by telephone and the availability of in person appointments. I also discussed with the patient that there may be a patient responsible charge related to this service. The patient expressed understanding and verbally consented to this telephonic visit.  Review of Systems           Objective:    There were no vitals filed for this visit. There is no height or weight on file to calculate BMI.  Advanced Directives 02/06/2021 10/24/2020 10/24/2020 03/17/2018 02/03/2018 02/18/2017 02/18/2017  Does Patient Have a Medical Advance Directive? No - No No No No No  Would patient like information on creating a medical advance directive? No - Patient declined No - Patient declined - - No - Patient declined No - Patient declined -  Some encounter information is confidential and restricted. Go to Review Flowsheets activity to see all data.    Current Medications (verified) Outpatient Encounter Medications as of 04/17/2021  Medication Sig   acetaminophen (TYLENOL) 500 MG tablet Take 500 mg by mouth every 8 (eight) hours as needed for mild pain or headache.   betamethasone dipropionate 0.05 % cream betamethasone dipropionate 0.05 % topical cream (Patient not taking: No sig reported)   Cyanocobalamin (VITAMIN B-12 PO) Take 1 tablet by mouth daily.   estradiol (ESTRACE) 0.1 MG/GM vaginal cream estradiol 0.01% (0.1 mg/gram) vaginal cream  USE 0.5 GRAM VAGINALLY DAILY AT BEDTIME FOR 2 WEEKS THEN USE VAGINALLY 2 TO 3 TIMES WEEKLY (Patient not taking: No sig reported)   LORazepam (ATIVAN) 1 MG tablet TAKE 1  TABLET(1 MG) BY MOUTH EVERY 8 HOURS AS NEEDED FOR ANXIETY   Multiple Vitamin (MULTIVITAMIN WITH MINERALS) TABS tablet Take 1 tablet by mouth daily.   nitrofurantoin, macrocrystal-monohydrate, (MACROBID) 100 MG capsule nitrofurantoin monohydrate/macrocrystals 100 mg capsule (Patient not taking: No sig reported)   phenazopyridine (PYRIDIUM) 200 MG tablet Take 1 tablet (200 mg total) by mouth 3 (three) times daily.   tamsulosin (FLOMAX) 0.4 MG CAPS capsule tamsulosin 0.4 mg capsule (Patient not taking: No sig reported)   Vitamin D, Ergocalciferol, (DRISDOL) 1.25 MG (50000 UNIT) CAPS capsule Take 1 capsule (50,000 Units total) by mouth every 7 (seven) days.   No facility-administered encounter medications on file as of 04/17/2021.    Allergies (verified) Patient has no known allergies.   History: Past Medical History:  Diagnosis Date   Anxiety    Arthritis    Phreesia 06/24/2020   Cervical cancer ([place])    When younger.   Diverticulitis    Pinched nerve    right hand   Past Surgical History:  Procedure Laterality Date   ABDOMINAL HYSTERECTOMY     BIOPSY  10/24/2020   Procedure: BIOPSY;  Surgeon: Montez Morita, Quillian Quince, MD;  [facility];  Service: Gastroenterology;;   cervical cancer removal     COLONOSCOPY WITH PROPOFOL N/A 10/24/2020   Procedure: COLONOSCOPY WITH PROPOFOL;  Surgeon: Harvel Quale, MD;  [facility];  Service: Gastroenterology;  Laterality: N/A;  9:00 am   ESOPHAGOGASTRODUODENOSCOPY (EGD) WITH PROPOFOL N/A 10/24/2020   Procedure: ESOPHAGOGASTRODUODENOSCOPY (EGD) WITH PROPOFOL;  Surgeon: Jenetta Downer  Roney Marion, MD;  [facility];  Service: Gastroenterology;  Laterality: N/A;   EYE SURGERY N/A    Phreesia 06/24/2020   POLYPECTOMY  10/24/2020   Procedure: POLYPECTOMY;  Surgeon: Montez Morita, Quillian Quince, MD;  [facility];  Service: Gastroenterology;;   Family History  Problem Relation Age of Onset   Cancer  Father    Alcohol abuse Sister    Cancer Other    Social History   Socioeconomic History   Marital status: Married    Spouse name: Not on file   Number of children: Not on file   Years of education: Not on file   Highest education level: Not on file  Occupational History   Occupation: retired  Tobacco Use   Smoking status: Former    Packs/day: 0.50    Years: 20.00    Pack years: 10.00    Types: Cigarettes   Smokeless tobacco: Never  Vaping Use   Vaping Use: Never used  Substance and Sexual Activity   Alcohol use: No   Drug use: Yes    Types: Marijuana    Comment: occ with gummies   Sexual activity: Not Currently    Birth control/protection: Surgical    Comment: hyst  Other Topics Concern   Not on file  Social History Narrative   Not on file   Social Determinants of Health   Financial Resource Strain: Low Risk    Difficulty of Paying Living Expenses: Not hard at all  Food Insecurity: No Food Insecurity   Worried About Charity fundraiser in the Last Year: Never true   [place] in the Last Year: Never true  Transportation Needs: No Transportation Needs   Lack of Transportation (Medical): No   Lack of Transportation (Non-Medical): No  Physical Activity: Sufficiently Active   Days of Exercise per Week: 7 days   Minutes of Exercise per Session: 60 min  Stress: Stress Concern Present   Feeling of Stress : To some extent  Social Connections: Socially Isolated   Frequency of Communication with Friends and Family: Once a week   Frequency of Social Gatherings with Friends and Family: Never   Attends Religious Services: Never   Marine scientist or Organizations: No   Attends Archivist Meetings: Never   Marital Status: Married    Tobacco Counseling Counseling given: Not Answered   Clinical Intake:                 Diabetic?No          Activities of Daily Living In your present state of health, do you have any difficulty  performing the following activities: 01/16/2021  Hearing? N  Vision? N  Difficulty concentrating or making decisions? N  Walking or climbing stairs? N  Dressing or bathing? N  Doing errands, shopping? N  Some recent data might be hidden    Patient Care Team: Lindell Spar, MD as PCP - General (Internal Medicine)  Indicate any recent Medical Services you may have received from other than [facility] providers in the past year (date may be approximate).     Assessment:   This is a routine wellness examination for [facility].  Hearing/Vision screen No results found.  Dietary issues and exercise activities discussed:     Goals Addressed   None   Depression Screen PHQ 2/9 Scores 01/16/2021 09/25/2020 06/27/2020 04/17/2020 01/13/2020  PHQ - 2 Score 0 0 0 0 2  PHQ- 9 Score 0  6 - 3 6    Fall Risk Fall Risk  01/16/2021 11/29/2020 09/25/2020 06/27/2020 04/17/2020  Falls in the past year? 0 0 0 0 0  Number falls in past yr: 0 - 0 0 0  Injury with Fall? 0 - 0 0 0  Risk for fall due to : No Fall Risks - No Fall Risks No Fall Risks -  Follow up Falls evaluation completed - Falls evaluation completed Falls evaluation completed -    FALL RISK PREVENTION PERTAINING TO THE HOME:  Any stairs in or around the home? Yes  If so, are there any without handrails? Yes  Home free of loose throw rugs in walkways, pet beds, electrical cords, etc? Yes  Adequate lighting in your home to reduce risk of falls? Yes   ASSISTIVE DEVICES UTILIZED TO PREVENT FALLS:  Life alert? No  Use of a cane, walker or w/c? No  Grab bars in the bathroom? No  Shower chair or bench in shower? No  Elevated toilet seat or a handicapped toilet? No   TIMED UP AND GO:  Was the test performed? No .  Length of time to ambulate 10 feet: n/a sec.     Cognitive Function:        Immunizations Immunization History  Administered Date(s) Administered   Pneumococcal Polysaccharide-23 02/19/2017    TDAP status: Due, Education has  been provided regarding the importance of this vaccine. Advised may receive this vaccine at local pharmacy or Health Dept. Aware to provide a copy of the vaccination record if obtained from local pharmacy or Health Dept. Verbalized acceptance and understanding.  Flu Vaccine status: Due, Education has been provided regarding the importance of this vaccine. Advised may receive this vaccine at local pharmacy or Health Dept. Aware to provide a copy of the vaccination record if obtained from local pharmacy or Health Dept. Verbalized acceptance and understanding.  Pneumococcal vaccine status: Due, Education has been provided regarding the importance of this vaccine. Advised may receive this vaccine at local pharmacy or Health Dept. Aware to provide a copy of the vaccination record if obtained from local pharmacy or Health Dept. Verbalized acceptance and understanding.  Covid-19 vaccine status: Declined, Education has been provided regarding the importance of this vaccine but patient still declined. Advised may receive this vaccine at local pharmacy or Health Dept.or vaccine clinic. Aware to provide a copy of the vaccination record if obtained from local pharmacy or Health Dept. Verbalized acceptance and understanding.  Qualifies for Shingles Vaccine? Yes   Zostavax completed Yes   Shingrix Completed?: No.    Education has been provided regarding the importance of this vaccine. Patient has been advised to call insurance company to determine out of pocket expense if they have not yet received this vaccine. Advised may also receive vaccine at local pharmacy or Health Dept. Verbalized acceptance and understanding.  Screening Tests Health Maintenance  Topic Date Due   COVID-19 Vaccine (1) Never done   MAMMOGRAM  Never done   Zoster Vaccines- Shingrix (1 of 2) Never done   Pneumonia Vaccine 48+ Years old (2 - PCV) 02/19/2018   DEXA SCAN  Never done   INFLUENZA VACCINE  Never done   TETANUS/TDAP  09/25/2021  (Originally 03/14/1975)   Hepatitis C Screening  09/25/2021 (Originally 03/14/1974)   COLONOSCOPY (Pts 45-63yrs Insurance coverage will need to be confirmed)  10/24/2025   HPV VACCINES  Aged Out    Health Maintenance  Health Maintenance Due  Topic Date Due  COVID-19 Vaccine (1) Never done   MAMMOGRAM  Never done   Zoster Vaccines- Shingrix (1 of 2) Never done   Pneumonia Vaccine 47+ Years old (2 - PCV) 02/19/2018   DEXA SCAN  Never done   INFLUENZA VACCINE  Never done    Colorectal cancer screening: Type of screening: Colonoscopy. Completed 10/24/20. Repeat every 5 years  Mammogram status: Ordered  . Pt provided with contact info and advised to call to schedule appt.   Bone Density status: Ordered  . Pt provided with contact info and advised to call to schedule appt.  Lung Cancer Screening: (Low Dose CT Chest recommended if Age 74-80 years, 30 pack-year currently smoking OR have quit w/in 15years.) does qualify.   Lung Cancer Screening Referral: yes  Additional Screening:  Hepatitis C Screening: does qualify; Completed no  Vision Screening: Recommended annual ophthalmology exams for early detection of glaucoma and other disorders of the eye. Is the patient up to date with their annual eye exam?  Yes  Who is the provider or what is the name of the office in which the patient attends annual eye exams? My eye doctor Linna Hoff  If pt is not established with a provider, would they like to be referred to a provider to establish care? No .   Dental Screening: Recommended annual dental exams for proper oral hygiene  Community Resource Referral / Chronic Care Management: CRR required this visit?  No   CCM required this visit?  No      Plan:     I have personally reviewed and noted the following in the patient's chart:   Medical and social history Use of alcohol, tobacco or illicit drugs  Current medications and supplements including opioid prescriptions.  Functional  ability and status Nutritional status Physical activity Advanced directives List of other physicians Hospitalizations, surgeries, and ER visits in previous 12 months Vitals Screenings to include cognitive, depression, and falls Referrals and appointments  In addition, I have reviewed and discussed with patient certain preventive protocols, quality metrics, and best practice recommendations. A written personalized care plan for preventive services as well as general preventive health recommendations were provided to patient.     Quentin Angst, [place]   04/17/2021   Nurse Notes: This is a tele health visit with the patient at home. The provider was in the office and is Ihor Dow, MD.

## 2021-04-17 NOTE — Assessment & Plan Note (Signed)
Last vitamin D Lab Results  Component Value Date   VD25OH 15.1 (L) 09/20/2020   On Vitamin D 50,000 IU once weekly Check Vitamin D level

## 2021-04-17 NOTE — Progress Notes (Signed)
Established Patient Office Visit  Subjective:  Patient ID: Allison Dickerson, female    DOB: 04-Jul-1955  Age: 65 y.o. MRN: [id number]  CC:  Chief Complaint  Patient presents with   Follow-up    3 month follow up anxiety and vit d deficiency mom passed a little of a month ago so anxiety is little worse     HPI Allison Dickerson  is a 65 year old female with past medical history of GERD, polyarthritis, severe anxiety and pulmonary nodule who presents for follow up of her chronic medical conditions.  GAD: She recently lost her mother, who was bedbound and had severe dementia.  Patient was a caregiver for her and had been taking Ativan chronically for her severe anxiety due to caregiver stress.  She has been stressed since losing her, but admits that she will have more time for herself now.  She would prefer to continue Ativan for now and will try to titrate down later, I agree. She denies any SI or HI. Denies any delusions or hallucinations.  She has been taking Vitamin D supplements for Vitamin D deficiency.  She complains of chronic diarrhea due to IBS.  She tried Levsin and Bentyl with no relief.  She also complains of chronic acid reflux and bloating, which did not improve with Omeprazole.  Denies any dysphagia or odynophagia.  Past Medical History:  Diagnosis Date   Anxiety    Arthritis    Phreesia 06/24/2020   Cervical cancer ([place])    When younger.   Diverticulitis    Pinched nerve    right hand    Past Surgical History:  Procedure Laterality Date   ABDOMINAL HYSTERECTOMY     BIOPSY  10/24/2020   Procedure: BIOPSY;  Surgeon: Montez Morita, Quillian Quince, MD;  [facility];  Service: Gastroenterology;;   cervical cancer removal     COLONOSCOPY WITH PROPOFOL N/A 10/24/2020   Procedure: COLONOSCOPY WITH PROPOFOL;  Surgeon: Harvel Quale, MD;  [facility];  Service: Gastroenterology;  Laterality: N/A;  9:00 am   ESOPHAGOGASTRODUODENOSCOPY (EGD)  WITH PROPOFOL N/A 10/24/2020   Procedure: ESOPHAGOGASTRODUODENOSCOPY (EGD) WITH PROPOFOL;  Surgeon: Harvel Quale, MD;  [facility];  Service: Gastroenterology;  Laterality: N/A;   EYE SURGERY N/A    Phreesia 06/24/2020   POLYPECTOMY  10/24/2020   Procedure: POLYPECTOMY;  Surgeon: Montez Morita, Quillian Quince, MD;  [facility];  Service: Gastroenterology;;    Family History  Problem Relation Age of Onset   Cancer Father    Alcohol abuse Sister    Cancer Other     Social History   Socioeconomic History   Marital status: Married    Spouse name: Not on file   Number of children: Not on file   Years of education: Not on file   Highest education level: Not on file  Occupational History   Occupation: retired  Tobacco Use   Smoking status: Former    Packs/day: 0.50    Years: 20.00    Pack years: 10.00    Types: Cigarettes   Smokeless tobacco: Never  Vaping Use   Vaping Use: Never used  Substance and Sexual Activity   Alcohol use: No   Drug use: Yes    Types: Marijuana    Comment: occ with gummies   Sexual activity: Not Currently    Birth control/protection: Surgical    Comment: hyst  Other Topics Concern   Not on file  Social History Narrative  Not on file   Social Determinants of Health   Financial Resource Strain: Low Risk    Difficulty of Paying Living Expenses: Not hard at all  Food Insecurity: No Food Insecurity   Worried About Charity fundraiser in the Last Year: Never true   [place] in the Last Year: Never true  Transportation Needs: No Transportation Needs   Lack of Transportation (Medical): No   Lack of Transportation (Non-Medical): No  Physical Activity: Inactive   Days of Exercise per Week: 0 days   Minutes of Exercise per Session: 0 min  Stress: Stress Concern Present   Feeling of Stress : To some extent  Social Connections: Moderately Isolated   Frequency of Communication with Friends and Family: More than  three times a week   Frequency of Social Gatherings with Friends and Family: More than three times a week   Attends Religious Services: Never   Marine scientist or Organizations: No   Attends Music therapist: Never   Marital Status: Married  Human resources officer Violence: Not At Risk   Fear of Current or Ex-Partner: No   Emotionally Abused: No   Physically Abused: No   Sexually Abused: No    Outpatient Medications Prior to Visit  Medication Sig Dispense Refill   acetaminophen (TYLENOL) 500 MG tablet Take 500 mg by mouth every 8 (eight) hours as needed for mild pain or headache.     betamethasone dipropionate 0.05 % cream betamethasone dipropionate 0.05 % topical cream     Cyanocobalamin (VITAMIN B-12 PO) Take 1 tablet by mouth daily.     estradiol (ESTRACE) 0.1 MG/GM vaginal cream estradiol 0.01% (0.1 mg/gram) vaginal cream  USE 0.5 GRAM VAGINALLY DAILY AT BEDTIME FOR 2 WEEKS THEN USE VAGINALLY 2 TO 3 TIMES WEEKLY     Multiple Vitamin (MULTIVITAMIN WITH MINERALS) TABS tablet Take 1 tablet by mouth daily.     phenazopyridine (PYRIDIUM) 200 MG tablet Take 1 tablet (200 mg total) by mouth 3 (three) times daily. 6 tablet 0   tamsulosin (FLOMAX) 0.4 MG CAPS capsule tamsulosin 0.4 mg capsule     Vitamin D, Ergocalciferol, (DRISDOL) 1.25 MG (50000 UNIT) CAPS capsule Take 1 capsule (50,000 Units total) by mouth every 7 (seven) days. 5 capsule 5   LORazepam (ATIVAN) 1 MG tablet TAKE 1 TABLET(1 MG) BY MOUTH EVERY 8 HOURS AS NEEDED FOR ANXIETY 90 tablet 2   nitrofurantoin, macrocrystal-monohydrate, (MACROBID) 100 MG capsule nitrofurantoin monohydrate/macrocrystals 100 mg capsule     No facility-administered medications prior to visit.    No Known Allergies  ROS Review of Systems  Constitutional:  Negative for chills and fever.  HENT:  Negative for congestion, sinus pressure, sinus pain and sore throat.   Eyes:  Negative for pain and discharge.  Respiratory:  Negative for  cough and shortness of breath.   Cardiovascular:  Negative for chest pain and palpitations.  Gastrointestinal:  Positive for diarrhea. Negative for abdominal pain, constipation, nausea and vomiting.  Endocrine: Negative for polydipsia and polyuria.  Genitourinary:  Negative for dysuria and hematuria.  Musculoskeletal:  Positive for arthralgias (Right wrist). Negative for neck pain and neck stiffness.  Skin:  Negative for rash.  Neurological:  Positive for weakness and numbness (Right hand). Negative for dizziness and speech difficulty.  Psychiatric/Behavioral:  Positive for sleep disturbance. Negative for agitation, behavioral problems, dysphoric mood and suicidal ideas. The patient is nervous/anxious.      Objective:    Physical Exam Vitals  reviewed.  Constitutional:      General: She is not in acute distress.    Appearance: She is not diaphoretic.  HENT:     Head: Normocephalic and atraumatic.     Nose: Nose normal.     Mouth/Throat:     Mouth: Mucous membranes are moist.  Eyes:     General: No scleral icterus.    Extraocular Movements: Extraocular movements intact.  Cardiovascular:     Rate and Rhythm: Normal rate and regular rhythm.     Pulses: Normal pulses.     Heart sounds: Normal heart sounds. No murmur heard. Pulmonary:     Breath sounds: Normal breath sounds. No wheezing or rales.  Abdominal:     Palpations: Abdomen is soft.     Tenderness: There is no abdominal tenderness.  Musculoskeletal:     Cervical back: Neck supple. No tenderness.     Right lower leg: No edema.     Left lower leg: No edema.     Comments: Right wrist brace in place  Skin:    General: Skin is warm.     Findings: No rash.  Neurological:     General: No focal deficit present.     Mental Status: She is alert and oriented to person, place, and time.     Sensory: No sensory deficit.     Motor: No weakness.  Psychiatric:        Mood and Affect: Mood normal.        Behavior: Behavior normal.     BP 137/76 (BP Location: Left Arm, Patient Position: Sitting, Cuff Size: Normal)   Pulse (!) 102   Resp 18   Ht '5\' 7"'  (1.702 m)   Wt 115 lb (52.2 kg)   SpO2 96%   BMI 18.01 kg/m  Wt Readings from Last 3 Encounters:  04/17/21 115 lb (52.2 kg)  01/16/21 117 lb 1.9 oz (53.1 kg)  11/29/20 118 lb (53.5 kg)     Health Maintenance Due  Topic Date Due   COVID-19 Vaccine (1) Never done   MAMMOGRAM  Never done   Zoster Vaccines- Shingrix (1 of 2) Never done   Pneumonia Vaccine 11+ Years old (2 - PCV) 02/19/2018   DEXA SCAN  Never done   INFLUENZA VACCINE  Never done    There are no preventive care reminders to display for this patient.  Lab Results  Component Value Date   TSH 1.860 09/20/2020   Lab Results  Component Value Date   WBC 9.2 02/06/2021   HGB 12.7 02/06/2021   HCT 37.9 02/06/2021   MCV 93.3 02/06/2021   PLT 242 02/06/2021   Lab Results  Component Value Date   NA 137 02/06/2021   K 3.8 02/06/2021   CO2 21 (L) 02/06/2021   GLUCOSE 92 02/06/2021   BUN 20 02/06/2021   CREATININE 0.77 02/06/2021   BILITOT 0.5 09/20/2020   ALKPHOS 63 09/20/2020   AST 16 09/20/2020   ALT 14 09/20/2020   PROT 7.4 09/20/2020   ALBUMIN 4.4 09/20/2020   CALCIUM 9.2 02/06/2021   ANIONGAP 8 02/06/2021   EGFR 64 09/20/2020   Lab Results  Component Value Date   CHOL 250 (H) 09/20/2020   Lab Results  Component Value Date   HDL 83 09/20/2020   Lab Results  Component Value Date   LDLCALC 146 (H) 09/20/2020   Lab Results  Component Value Date   TRIG 122 09/20/2020   Lab Results  Component Value Date  CHOLHDL 3.0 09/20/2020   Lab Results  Component Value Date   HGBA1C 5.8 (H) 09/20/2020      Assessment & Plan:   Problem List Items Addressed This Visit       Digestive   GERD (gastroesophageal reflux disease)    Does not take Omeprazole as she thinks it made her bloating worse Will give trial of Pantoprazole for now      Relevant Medications    pantoprazole (PROTONIX) 40 MG tablet   IBS (irritable bowel syndrome)    Tried Bentyl and Levsin, did not provide relief Has been trying diet modification Followed by GI      Relevant Medications   pantoprazole (PROTONIX) 40 MG tablet     Other   Anxiety - Primary    Had caregiver stress as well, has been stressed due to loss of her mother recently On Lorazepam 1 mg TID, has been on Benzodiazepine for more than 15 years Did not tolerate Wellbutrin in the past with her previous PCP Did not like Buspar      Relevant Medications   LORazepam (ATIVAN) 1 MG tablet (Start on 04/27/2021)   Other Relevant Orders   ToxASSURE Select 13 (MW), Urine   Prediabetes    Lab Results  Component Value Date   HGBA1C 5.8 (H) 09/20/2020  Advised to follow low carb diet DASH diet material provided      Relevant Orders   CMP14+EGFR   HgB A1c   Vitamin D deficiency    Last vitamin D Lab Results  Component Value Date   VD25OH 15.1 (L) 09/20/2020  On Vitamin D 50,000 IU once weekly Check Vitamin D level      Relevant Orders   Vitamin D (25 hydroxy)   Other Visit Diagnoses     Refused influenza vaccine           Meds ordered this encounter  Medications   pantoprazole (PROTONIX) 40 MG tablet    Sig: Take 1 tablet (40 mg total) by mouth daily.    Dispense:  30 tablet    Refill:  3   LORazepam (ATIVAN) 1 MG tablet    Sig: TAKE 1 TABLET(1 MG) BY MOUTH EVERY 8 HOURS AS NEEDED FOR ANXIETY    Dispense:  90 tablet    Refill:  2    Follow-up: Return in about 3 months (around 07/18/2021) for Anxiety.    Lindell Spar, MD

## 2021-04-17 NOTE — Patient Instructions (Addendum)
Please start taking Pantoprazole.  Please continue taking other medications as prescribed.  Avoid hot and spicy food. If you have persistent stomach pain or bloating, please contact your GI physician.

## 2021-04-17 NOTE — Patient Instructions (Signed)
[name] , Thank you for taking time to come for your Medicare Wellness Visit. I appreciate your ongoing commitment to your health goals. Please review the following plan we discussed and let me know if I can assist you in the future.   Screening recommendations/referrals: Colonoscopy: complete  Mammogram: due now  Bone Density: due now Recommended yearly ophthalmology/optometry visit for glaucoma screening and checkup Recommended yearly dental visit for hygiene and checkup  Vaccinations: Influenza vaccine: due now Pneumococcal vaccine: due now  Tdap vaccine: due now  Shingles vaccine: due now     Advanced directives: yes    Conditions/risks identified: hyperlipidemia   Next appointment: 1 year   Preventive Care 67 Years and Older, Female Preventive care refers to lifestyle choices and visits with your health care provider that can promote health and wellness. What does preventive care include? A yearly physical exam. This is also called an annual well check. Dental exams once or twice a year. Routine eye exams. Ask your health care provider how often you should have your eyes checked. Personal lifestyle choices, including: Daily care of your teeth and gums. Regular physical activity. Eating a healthy diet. Avoiding tobacco and drug use. Limiting alcohol use. Practicing safe sex. Taking low-dose aspirin every day. Taking vitamin and mineral supplements as recommended by your health care provider. What happens during an annual well check? The services and screenings done by your health care provider during your annual well check will depend on your age, overall health, lifestyle risk factors, and family history of disease. Counseling  Your health care provider may ask you questions about your: Alcohol use. Tobacco use. Drug use. Emotional well-being. Home and relationship well-being. Sexual activity. Eating habits. History of falls. Memory and ability to understand  (cognition). Work and work Statistician. Reproductive health. Screening  You may have the following tests or measurements: Height, weight, and BMI. Blood pressure. Lipid and cholesterol levels. These may be checked every 5 years, or more frequently if you are over 50 years old. Skin check. Lung cancer screening. You may have this screening every year starting at age 73 if you have a 30-pack-year history of smoking and currently smoke or have quit within the past 15 years. Fecal occult blood test (FOBT) of the stool. You may have this test every year starting at age 96. Flexible sigmoidoscopy or colonoscopy. You may have a sigmoidoscopy every 5 years or a colonoscopy every 10 years starting at age 62. Hepatitis C blood test. Hepatitis B blood test. Sexually transmitted disease (STD) testing. Diabetes screening. This is done by checking your blood sugar (glucose) after you have not eaten for a while (fasting). You may have this done every 1-3 years. Bone density scan. This is done to screen for osteoporosis. You may have this done starting at age 67. Mammogram. This may be done every 1-2 years. Talk to your health care provider about how often you should have regular mammograms. Talk with your health care provider about your test results, treatment options, and if necessary, the need for more tests. Vaccines  Your health care provider may recommend certain vaccines, such as: Influenza vaccine. This is recommended every year. Tetanus, diphtheria, and acellular pertussis (Tdap, Td) vaccine. You may need a Td booster every 10 years. Zoster vaccine. You may need this after age 19. Pneumococcal 13-valent conjugate (PCV13) vaccine. One dose is recommended after age 67. Pneumococcal polysaccharide (PPSV23) vaccine. One dose is recommended after age 67. Talk to your health care provider about which  screenings and vaccines you need and how often you need them. This information is not intended to  replace advice given to you by your health care provider. Make sure you discuss any questions you have with your health care provider. Document Released: 06/30/2015 Document Revised: 02/21/2016 Document Reviewed: 04/04/2015 Elsevier Interactive Patient Education  2017 [place] Prevention in the Home Falls can cause injuries. They can happen to people of all ages. There are many things you can do to make your home safe and to help prevent falls. What can I do on the outside of my home? Regularly fix the edges of walkways and driveways and fix any cracks. Remove anything that might make you trip as you walk through a door, such as a raised step or threshold. Trim any bushes or trees on the path to your home. Use bright outdoor lighting. Clear any walking paths of anything that might make someone trip, such as rocks or tools. Regularly check to see if handrails are loose or broken. Make sure that both sides of any steps have handrails. Any raised decks and porches should have guardrails on the edges. Have any leaves, snow, or ice cleared regularly. Use sand or salt on walking paths during winter. Clean up any spills in your garage right away. This includes oil or grease spills. What can I do in the bathroom? Use night lights. Install grab bars by the toilet and in the tub and shower. Do not use towel bars as grab bars. Use non-skid mats or decals in the tub or shower. If you need to sit down in the shower, use a plastic, non-slip stool. Keep the floor dry. Clean up any water that spills on the floor as soon as it happens. Remove soap buildup in the tub or shower regularly. Attach bath mats securely with double-sided non-slip rug tape. Do not have throw rugs and other things on the floor that can make you trip. What can I do in the bedroom? Use night lights. Make sure that you have a light by your bed that is easy to reach. Do not use any sheets or blankets that are too big for  your bed. They should not hang down onto the floor. Have a firm chair that has side arms. You can use this for support while you get dressed. Do not have throw rugs and other things on the floor that can make you trip. What can I do in the kitchen? Clean up any spills right away. Avoid walking on wet floors. Keep items that you use a lot in easy-to-reach places. If you need to reach something above you, use a strong step stool that has a grab bar. Keep electrical cords out of the way. Do not use floor polish or wax that makes floors slippery. If you must use wax, use non-skid floor wax. Do not have throw rugs and other things on the floor that can make you trip. What can I do with my stairs? Do not leave any items on the stairs. Make sure that there are handrails on both sides of the stairs and use them. Fix handrails that are broken or loose. Make sure that handrails are as long as the stairways. Check any carpeting to make sure that it is firmly attached to the stairs. Fix any carpet that is loose or worn. Avoid having throw rugs at the top or bottom of the stairs. If you do have throw rugs, attach them to the floor with carpet  tape. Make sure that you have a light switch at the top of the stairs and the bottom of the stairs. If you do not have them, ask someone to add them for you. What else can I do to help prevent falls? Wear shoes that: Do not have high heels. Have rubber bottoms. Are comfortable and fit you well. Are closed at the toe. Do not wear sandals. If you use a stepladder: Make sure that it is fully opened. Do not climb a closed stepladder. Make sure that both sides of the stepladder are locked into place. Ask someone to hold it for you, if possible. Clearly mark and make sure that you can see: Any grab bars or handrails. First and last steps. Where the edge of each step is. Use tools that help you move around (mobility aids) if they are needed. These  include: Canes. Walkers. Scooters. Crutches. Turn on the lights when you go into a dark area. Replace any light bulbs as soon as they burn out. Set up your furniture so you have a clear path. Avoid moving your furniture around. If any of your floors are uneven, fix them. If there are any pets around you, be aware of where they are. Review your medicines with your doctor. Some medicines can make you feel dizzy. This can increase your chance of falling. Ask your doctor what other things that you can do to help prevent falls. This information is not intended to replace advice given to you by your health care provider. Make sure you discuss any questions you have with your health care provider. Document Released: 03/30/2009 Document Revised: 11/09/2015 Document Reviewed: 07/08/2014 Elsevier Interactive Patient Education  2017 [employer].

## 2021-04-17 NOTE — Assessment & Plan Note (Signed)
Had caregiver stress as well, has been stressed due to loss of her mother recently On Lorazepam 1 mg TID, has been on Benzodiazepine for more than 15 years Did not tolerate Wellbutrin in the past with her previous PCP Did not like Buspar

## 2021-04-18 LAB — HEMOGLOBIN A1C
Est. average glucose Bld gHb Est-mCnc: 117 mg/dL
Hgb A1c MFr Bld: 5.7 % — ABNORMAL HIGH (ref 4.8–5.6)

## 2021-04-18 LAB — CMP14+EGFR
ALT: 18 IU/L (ref 0–32)
AST: 21 IU/L (ref 0–40)
Albumin/Globulin Ratio: 1.6 (ref 1.2–2.2)
Albumin: 4.5 g/dL (ref 3.8–4.8)
Alkaline Phosphatase: 72 IU/L (ref 44–121)
BUN/Creatinine Ratio: 17 (ref 12–28)
BUN: 16 mg/dL (ref 8–27)
Bilirubin Total: 0.3 mg/dL (ref 0.0–1.2)
CO2: 22 mmol/L (ref 20–29)
Calcium: 10.2 mg/dL (ref 8.7–10.3)
Chloride: 104 mmol/L (ref 96–106)
Creatinine, Ser: 0.96 mg/dL (ref 0.57–1.00)
Globulin, Total: 2.8 g/dL (ref 1.5–4.5)
Glucose: 112 mg/dL — ABNORMAL HIGH (ref 70–99)
Potassium: 4.3 mmol/L (ref 3.5–5.2)
Sodium: 142 mmol/L (ref 134–144)
Total Protein: 7.3 g/dL (ref 6.0–8.5)
eGFR: 65 mL/min/{1.73_m2} (ref >59)

## 2021-04-18 LAB — VITAMIN D 25 HYDROXY (VIT D DEFICIENCY, FRACTURES): Vit D, 25-Hydroxy: 49.4 ng/mL (ref 30.0–100.0)

## 2021-04-24 LAB — TOXASSURE SELECT 13 (MW), URINE

## 2021-05-29 ENCOUNTER — Encounter: Payer: Self-pay | Admitting: Internal Medicine

## 2021-05-29 DIAGNOSIS — F1320 Sedative, hypnotic or anxiolytic dependence, uncomplicated: Secondary | ICD-10-CM | POA: Insufficient documentation

## 2021-07-12 NOTE — Addendum Note (Signed)
Addended by: Eual Fines on: 07/12/2021 02:23 PM   Modules accepted: Orders

## 2021-07-24 ENCOUNTER — Encounter: Payer: Self-pay | Admitting: Internal Medicine

## 2021-07-24 ENCOUNTER — Other Ambulatory Visit: Payer: Self-pay

## 2021-07-24 ENCOUNTER — Ambulatory Visit (INDEPENDENT_AMBULATORY_CARE_PROVIDER_SITE_OTHER): Payer: Medicare Other | Admitting: Internal Medicine

## 2021-07-24 VITALS — BP 128/78 | HR 97 | Ht 67.0 in | Wt 116.1 lb

## 2021-07-24 DIAGNOSIS — K580 Irritable bowel syndrome with diarrhea: Secondary | ICD-10-CM

## 2021-07-24 DIAGNOSIS — Z79899 Other long term (current) drug therapy: Secondary | ICD-10-CM

## 2021-07-24 DIAGNOSIS — R7303 Prediabetes: Secondary | ICD-10-CM

## 2021-07-24 DIAGNOSIS — F419 Anxiety disorder, unspecified: Secondary | ICD-10-CM | POA: Diagnosis not present

## 2021-07-24 MED ORDER — LORAZEPAM 1 MG PO TABS: 1.0000 mg | ORAL_TABLET | Freq: Three times a day (TID) | ORAL | 2 refills | Status: DC | PRN

## 2021-07-24 NOTE — Progress Notes (Signed)
Established Patient Office Visit  Subjective:  Patient ID: Allison Dickerson, female    DOB: 1956/04/22  Age: 66 y.o. MRN: [id number]  CC:  Chief Complaint  Patient presents with   Follow-up    3 month follow up    HPI Allison Dickerson is a 66 y.o. female with past medical history of GERD, polyarthritis, severe anxiety and pulmonary nodule who presents for f/u of her chronic medical conditions.  GAD: She recently lost her mother, who was bedbound and had severe dementia.  Patient was a caregiver for her and had been taking Ativan chronically for her severe anxiety due to caregiver stress.  She is stressed currently about her brother, who has been missing. She would prefer to continue Ativan for now and will try to titrate down later, I agree. She denies any SI or HI. Denies any delusions or hallucinations.  She had swelling of the UE with pantoprazole.  She has instead started taking ginger, which has helped with her GERD.  Her diarrhea has also resolved now.  She wants to talk about her preventive tests in the next visit.  Past Medical History:  Diagnosis Date   Anxiety    Arthritis    Phreesia 06/24/2020   Cervical cancer ([place])    When younger.   Diverticulitis    Pinched nerve    right hand    Past Surgical History:  Procedure Laterality Date   ABDOMINAL HYSTERECTOMY     BIOPSY  10/24/2020   Procedure: BIOPSY;  Surgeon: Montez Morita, Quillian Quince, MD;  [facility];  Service: Gastroenterology;;   cervical cancer removal     COLONOSCOPY WITH PROPOFOL N/A 10/24/2020   Procedure: COLONOSCOPY WITH PROPOFOL;  Surgeon: Harvel Quale, MD;  [facility];  Service: Gastroenterology;  Laterality: N/A;  9:00 am   ESOPHAGOGASTRODUODENOSCOPY (EGD) WITH PROPOFOL N/A 10/24/2020   Procedure: ESOPHAGOGASTRODUODENOSCOPY (EGD) WITH PROPOFOL;  Surgeon: Harvel Quale, MD;  [facility];  Service: Gastroenterology;  Laterality: N/A;   EYE  SURGERY N/A    Phreesia 06/24/2020   POLYPECTOMY  10/24/2020   Procedure: POLYPECTOMY;  Surgeon: Montez Morita, Quillian Quince, MD;  [facility];  Service: Gastroenterology;;    Family History  Problem Relation Age of Onset   Cancer Father    Alcohol abuse Sister    Cancer Other     Social History   Socioeconomic History   Marital status: Married    Spouse name: Not on file   Number of children: Not on file   Years of education: Not on file   Highest education level: Not on file  Occupational History   Occupation: retired  Tobacco Use   Smoking status: Former    Packs/day: 0.50    Years: 20.00    Pack years: 10.00    Types: Cigarettes   Smokeless tobacco: Never  Vaping Use   Vaping Use: Never used  Substance and Sexual Activity   Alcohol use: No   Drug use: Yes    Types: Marijuana    Comment: occ with gummies   Sexual activity: Not Currently    Birth control/protection: Surgical    Comment: hyst  Other Topics Concern   Not on file  Social History Narrative   Not on file   Social Determinants of Health   Financial Resource Strain: Low Risk    Difficulty of Paying Living Expenses: Not hard at all  Food Insecurity: No Food Insecurity   Worried  About Running Out of Food in the Last Year: Never true   Ran Out of Food in the Last Year: Never true  Transportation Needs: No Transportation Needs   Lack of Transportation (Medical): No   Lack of Transportation (Non-Medical): No  Physical Activity: Inactive   Days of Exercise per Week: 0 days   Minutes of Exercise per Session: 0 min  Stress: Stress Concern Present   Feeling of Stress : To some extent  Social Connections: Moderately Isolated   Frequency of Communication with Friends and Family: More than three times a week   Frequency of Social Gatherings with Friends and Family: More than three times a week   Attends Religious Services: Never   Marine scientist or Organizations: No   Attends Programme researcher, broadcasting/film/video: Never   Marital Status: Married  Human resources officer Violence: Not At Risk   Fear of Current or Ex-Partner: No   Emotionally Abused: No   Physically Abused: No   Sexually Abused: No    Outpatient Medications Prior to Visit  Medication Sig Dispense Refill   acetaminophen (TYLENOL) 500 MG tablet Take 500 mg by mouth every 8 (eight) hours as needed for mild pain or headache.     betamethasone dipropionate 0.05 % cream betamethasone dipropionate 0.05 % topical cream     Cyanocobalamin (VITAMIN B-12 PO) Take 1 tablet by mouth daily.     estradiol (ESTRACE) 0.1 MG/GM vaginal cream estradiol 0.01% (0.1 mg/gram) vaginal cream  USE 0.5 GRAM VAGINALLY DAILY AT BEDTIME FOR 2 WEEKS THEN USE VAGINALLY 2 TO 3 TIMES WEEKLY     Multiple Vitamin (MULTIVITAMIN WITH MINERALS) TABS tablet Take 1 tablet by mouth daily.     LORazepam (ATIVAN) 1 MG tablet TAKE 1 TABLET(1 MG) BY MOUTH EVERY 8 HOURS AS NEEDED FOR ANXIETY 90 tablet 2   phenazopyridine (PYRIDIUM) 200 MG tablet Take 1 tablet (200 mg total) by mouth 3 (three) times daily. 6 tablet 0   tamsulosin (FLOMAX) 0.4 MG CAPS capsule tamsulosin 0.4 mg capsule     Vitamin D, Ergocalciferol, (DRISDOL) 1.25 MG (50000 UNIT) CAPS capsule Take 1 capsule (50,000 Units total) by mouth every 7 (seven) days. 5 capsule 5   pantoprazole (PROTONIX) 40 MG tablet Take 1 tablet (40 mg total) by mouth daily. (Patient not taking: Reported on 07/24/2021) 30 tablet 3   No facility-administered medications prior to visit.    No Known Allergies  ROS Review of Systems  Constitutional:  Negative for chills and fever.  HENT:  Negative for congestion, sinus pressure, sinus pain and sore throat.   Eyes:  Negative for pain and discharge.  Respiratory:  Negative for cough and shortness of breath.   Cardiovascular:  Negative for chest pain and palpitations.  Gastrointestinal:  Positive for diarrhea. Negative for abdominal pain, constipation, nausea and vomiting.   Endocrine: Negative for polydipsia and polyuria.  Genitourinary:  Negative for dysuria and hematuria.  Musculoskeletal:  Positive for arthralgias (Right wrist). Negative for neck pain and neck stiffness.  Skin:  Negative for rash.  Neurological:  Positive for weakness and numbness (Right hand). Negative for dizziness and speech difficulty.  Psychiatric/Behavioral:  Positive for sleep disturbance. Negative for agitation, behavioral problems, dysphoric mood and suicidal ideas. The patient is nervous/anxious.      Objective:    Physical Exam Vitals reviewed.  Constitutional:      General: She is not in acute distress.    Appearance: She is not diaphoretic.  HENT:  Head: Normocephalic and atraumatic.     Nose: Nose normal.     Mouth/Throat:     Mouth: Mucous membranes are moist.  Eyes:     General: No scleral icterus.    Extraocular Movements: Extraocular movements intact.  Cardiovascular:     Rate and Rhythm: Normal rate and regular rhythm.     Pulses: Normal pulses.     Heart sounds: Normal heart sounds. No murmur heard. Pulmonary:     Breath sounds: Normal breath sounds. No wheezing or rales.  Musculoskeletal:     Cervical back: Neck supple. No tenderness.     Right lower leg: No edema.     Left lower leg: No edema.     Comments: Right wrist brace in place  Skin:    General: Skin is warm.     Findings: No rash.  Neurological:     General: No focal deficit present.     Mental Status: She is alert and oriented to person, place, and time.     Sensory: No sensory deficit.     Motor: No weakness.  Psychiatric:        Mood and Affect: Mood normal.        Behavior: Behavior normal.    BP 128/78    Pulse 97    Ht '5\' 7"'  (1.702 m)    Wt 116 lb 1.9 oz (52.7 kg)    SpO2 96%    BMI 18.19 kg/m  Wt Readings from Last 3 Encounters:  07/24/21 116 lb 1.9 oz (52.7 kg)  04/17/21 115 lb (52.2 kg)  01/16/21 117 lb 1.9 oz (53.1 kg)    Lab Results  Component Value Date   TSH  1.860 09/20/2020   Lab Results  Component Value Date   WBC 9.2 02/06/2021   HGB 12.7 02/06/2021   HCT 37.9 02/06/2021   MCV 93.3 02/06/2021   PLT 242 02/06/2021   Lab Results  Component Value Date   NA 142 04/17/2021   K 4.3 04/17/2021   CO2 22 04/17/2021   GLUCOSE 112 (H) 04/17/2021   BUN 16 04/17/2021   CREATININE 0.96 04/17/2021   BILITOT 0.3 04/17/2021   ALKPHOS 72 04/17/2021   AST 21 04/17/2021   ALT 18 04/17/2021   PROT 7.3 04/17/2021   ALBUMIN 4.5 04/17/2021   CALCIUM 10.2 04/17/2021   ANIONGAP 8 02/06/2021   EGFR 65 04/17/2021   Lab Results  Component Value Date   CHOL 250 (H) 09/20/2020   Lab Results  Component Value Date   HDL 83 09/20/2020   Lab Results  Component Value Date   LDLCALC 146 (H) 09/20/2020   Lab Results  Component Value Date   TRIG 122 09/20/2020   Lab Results  Component Value Date   CHOLHDL 3.0 09/20/2020   Lab Results  Component Value Date   HGBA1C 5.7 (H) 04/17/2021      Assessment & Plan:   Problem List Items Addressed This Visit       Digestive   IBS (irritable bowel syndrome)    Now resolved Has tried Bentyl and Levsin, did not provide relief Takes ginger now        Other   GAD (generalized anxiety disorder) - Primary    Had caregiver stress as well, has been stressed due to loss of her mother recently and her missing brother On Lorazepam 1 mg TID, has been on Benzodiazepine for more than 15 years Did not tolerate Wellbutrin in the past with her  previous PCP Did not like Buspar      Relevant Medications   LORazepam (ATIVAN) 1 MG tablet   Prediabetes   Relevant Orders   Basic Metabolic Panel (BMET)   Hemoglobin A1c   Chronic prescription benzodiazepine use    For severe GAD Will check Toxassure in the next visit      Relevant Orders   ToxASSURE Select 13 (MW), Urine    Meds ordered this encounter  Medications   LORazepam (ATIVAN) 1 MG tablet    Sig: Take 1 tablet (1 mg total) by mouth every 8  (eight) hours as needed for anxiety.    Dispense:  90 tablet    Refill:  2    Follow-up: Return in about 3 months (around 10/21/2021) for GAD.    Lindell Spar, MD

## 2021-07-24 NOTE — Assessment & Plan Note (Signed)
For severe GAD Will check Toxassure in the next visit

## 2021-07-24 NOTE — Assessment & Plan Note (Signed)
Had caregiver stress as well, has been stressed due to loss of her mother recently and her missing brother On Lorazepam 1 mg TID, has been on Benzodiazepine for more than 15 years Did not tolerate Wellbutrin in the past with her previous PCP Did not like Buspar

## 2021-07-24 NOTE — Patient Instructions (Addendum)
Please continue to take Ativan as prescribed.  Please continue Womens' multivitamin.  Please get blood tests are done before the next visit.

## 2021-07-24 NOTE — Assessment & Plan Note (Signed)
Now resolved Has tried Bentyl and Levsin, did not provide relief Takes ginger now 

## 2021-09-20 IMAGING — CT CT CHEST W/O CM
2 of 4 series · 15 of 36 positions shown, 18 images · non-contrast
Comparison: February 03, 2018.

CLINICAL DATA: Lung nodule.

EXAM:
CT CHEST WITHOUT CONTRAST
TECHNIQUE: Multidetector CT imaging of the chest was performed following the
standard protocol without IV contrast.

[Series 2: routine chest without · axial · non-contrast · 0.64mm/px · z∈[+968,+1222]mm · 12 of 151 slices shown, 15 images]
[im 12/151  mediastinal]
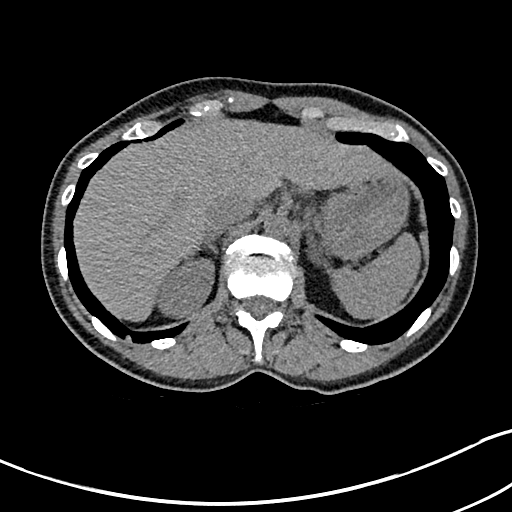
[im 12/151  lung]
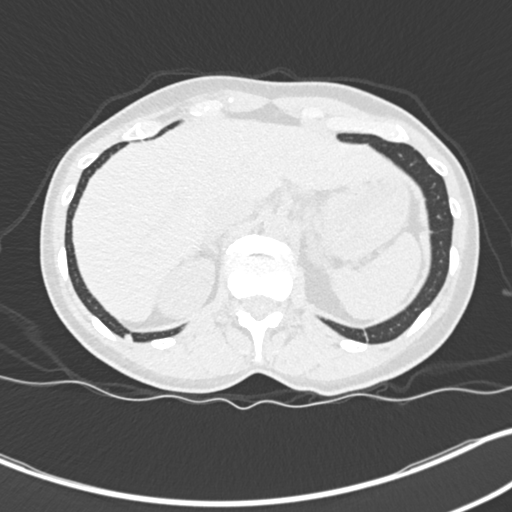
[im 24/151  lung]
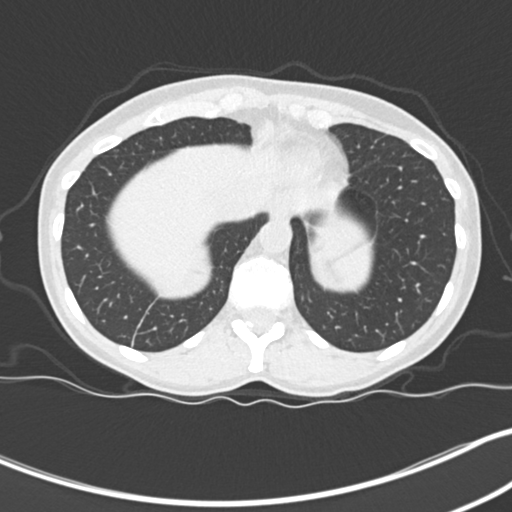
[im 35/151  lung]
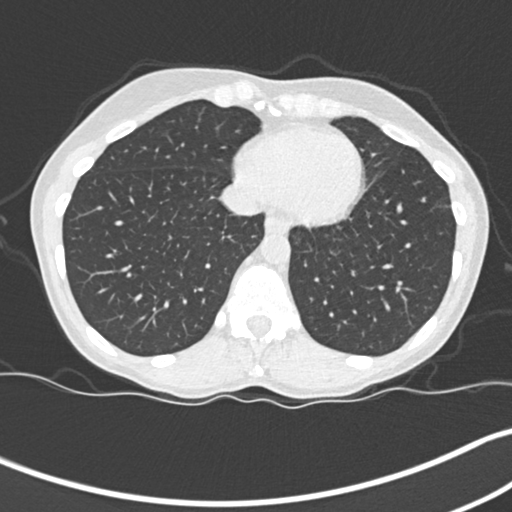
[im 47/151  lung]
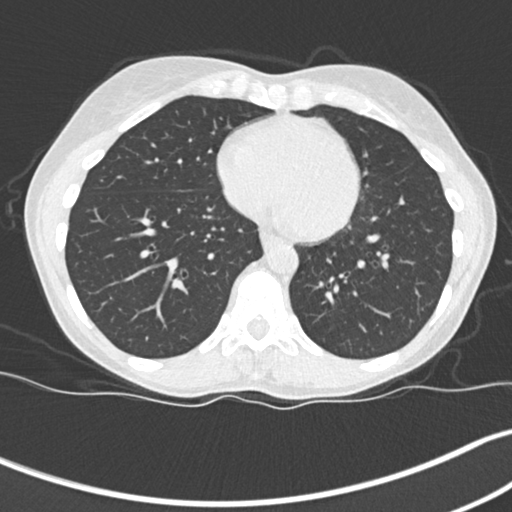
[im 58/151  mediastinal]
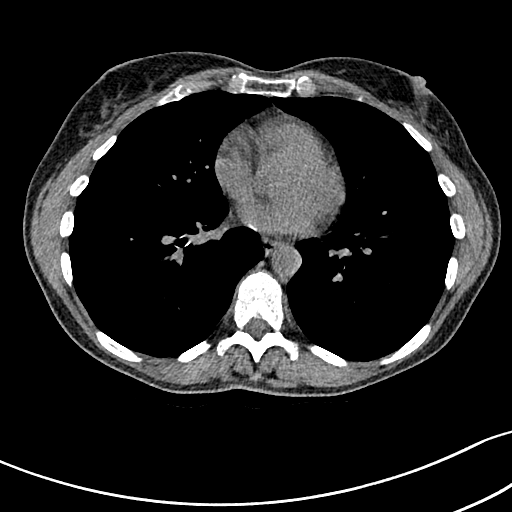
[im 58/151  lung]
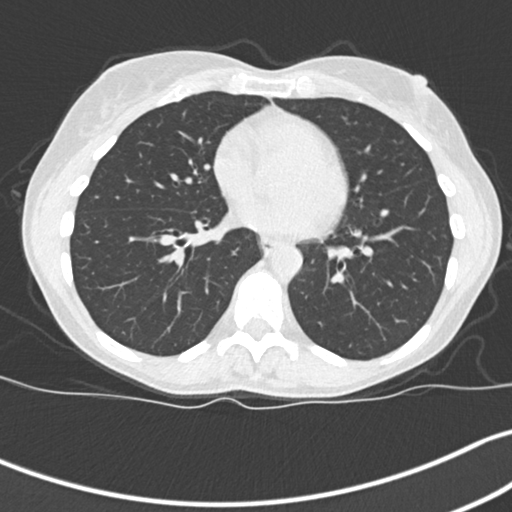
[im 70/151  lung]
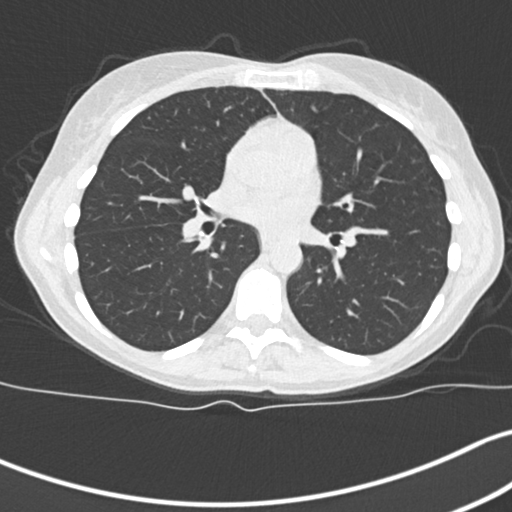
[im 81/151  lung]
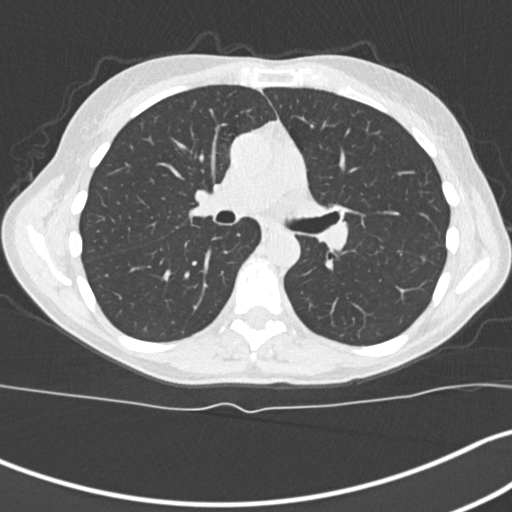
[im 93/151  lung]
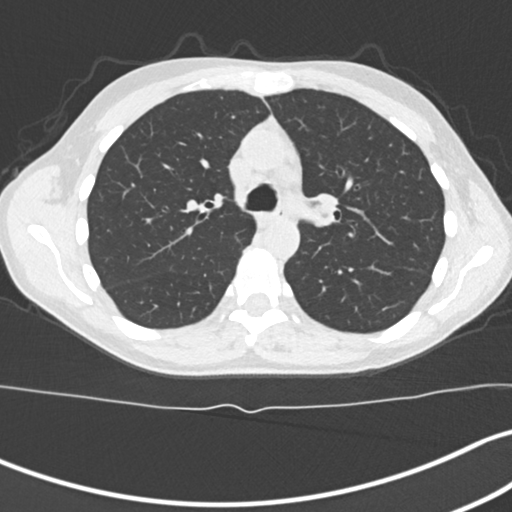
[im 104/151  mediastinal]
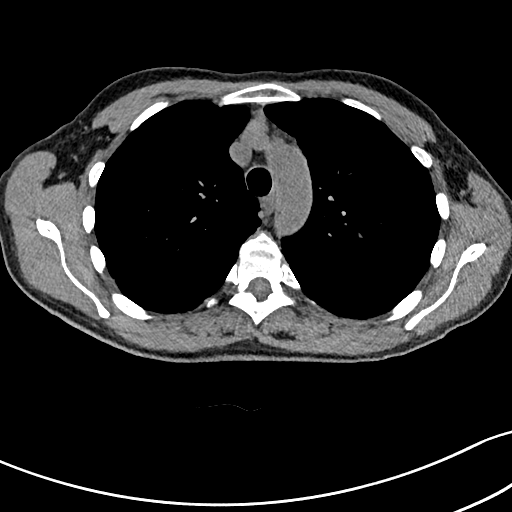
[im 104/151  lung]
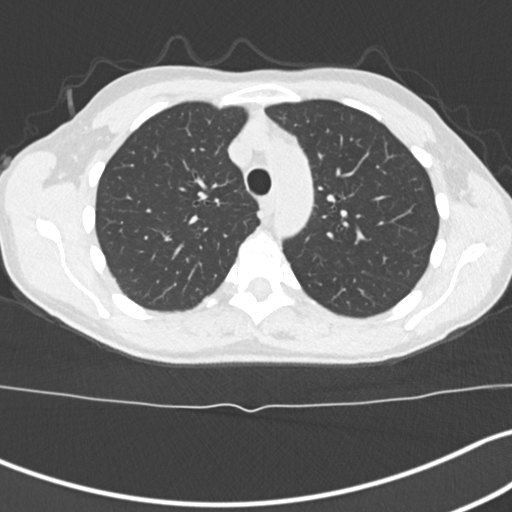
[im 116/151  lung]
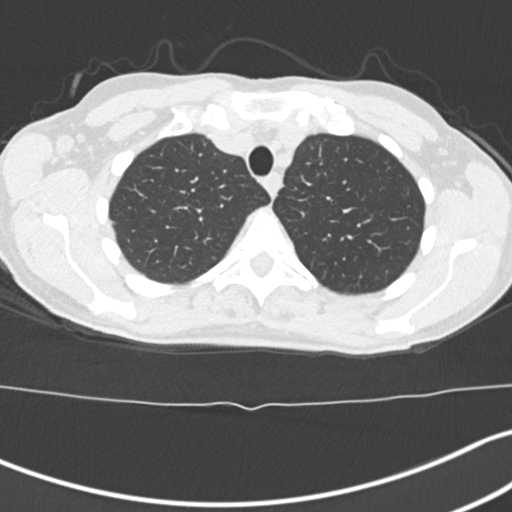
[im 127/151  lung]
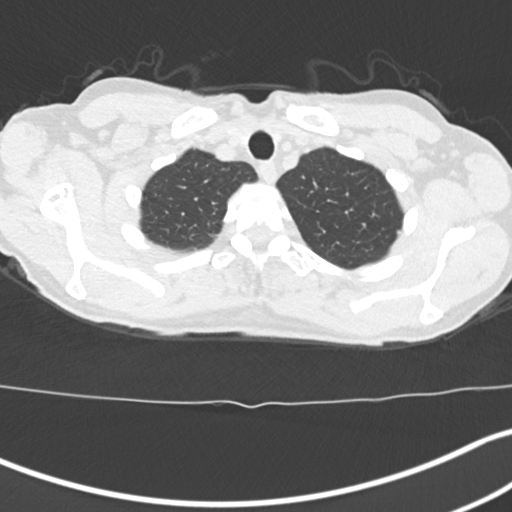
[im 139/151  lung]
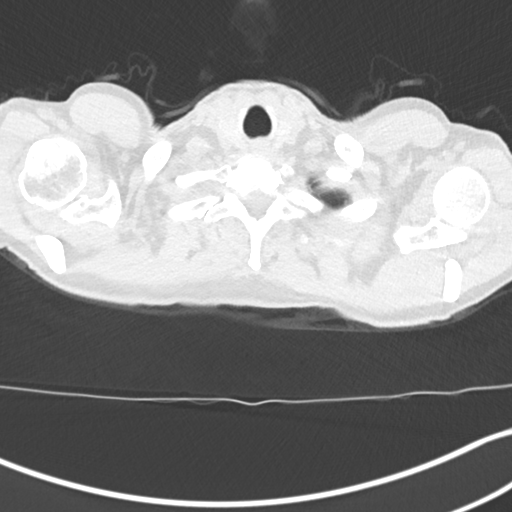

[Series 5: coronal · coronal · 0.62mm/px · 3 of 118 slices shown]
[im 24/118  lung]
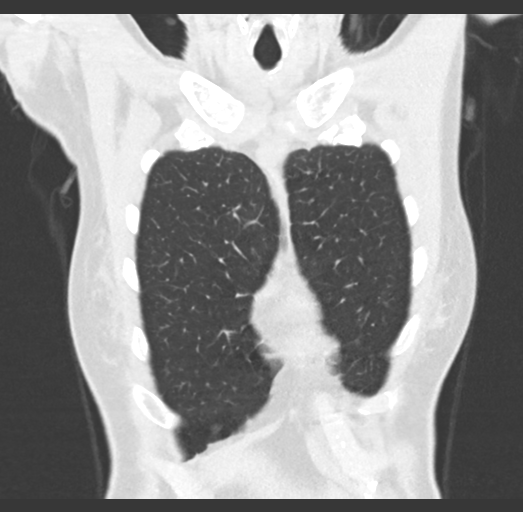
[im 47/118  lung]
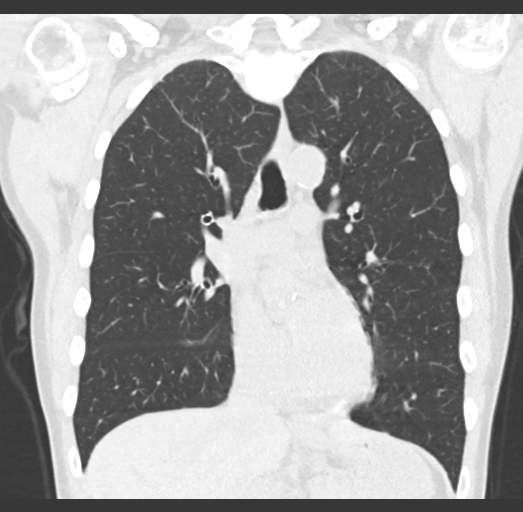
[im 71/118  lung]
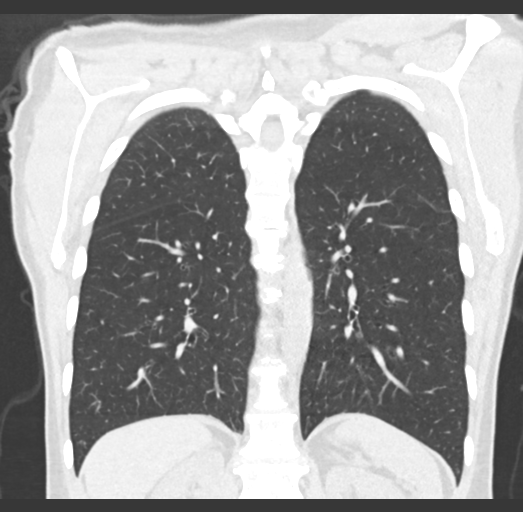

[15 of 36 positions shown; findings below may reference images not displayed]

FINDINGS: Cardiovascular: No significant vascular findings. Normal heart size.
No pericardial effusion.

Mediastinum/Nodes: No enlarged mediastinal or axillary lymph nodes.
Thyroid gland, trachea, and esophagus demonstrate no significant
findings.

Lungs/Pleura: No pneumothorax or pleural effusion is noted. Right
lung is clear. Stable 7 mm subpleural nodule is seen in left lower
lobe laterally best seen on image number 119 of series 4. This can
be considered benign at this point with no further follow-up
required. 5 mm nodule is seen in left lower lobe adjacent to left
major fissure best seen on image number 143 of series 6.

Upper Abdomen: No acute abnormality.

Musculoskeletal: No chest wall mass or suspicious bone lesions
identified.
IMPRESSION: 1. Stable 7 mm subpleural nodule is seen in left lower lobe. This
can be seen considered benign at this time with no further follow-up
required
2. 5 mm nodule is seen in left lower lobe adjacent to left major
fissure which may represent intrapulmonary lymph node. No follow-up
needed if patient is low-risk. Non-contrast chest CT can be
considered in 12 months if patient is high-risk. This recommendation
follows the consensus statement: Guidelines for Management of
Incidental Pulmonary Nodules Detected on CT Images: From the

## 2021-10-24 ENCOUNTER — Encounter: Payer: Self-pay | Admitting: Family Medicine

## 2021-10-24 ENCOUNTER — Ambulatory Visit (INDEPENDENT_AMBULATORY_CARE_PROVIDER_SITE_OTHER): Payer: Medicare Other | Admitting: Family Medicine

## 2021-10-24 DIAGNOSIS — K529 Noninfective gastroenteritis and colitis, unspecified: Secondary | ICD-10-CM

## 2021-10-24 DIAGNOSIS — Z79899 Other long term (current) drug therapy: Secondary | ICD-10-CM | POA: Diagnosis not present

## 2021-10-24 DIAGNOSIS — F419 Anxiety disorder, unspecified: Secondary | ICD-10-CM | POA: Diagnosis not present

## 2021-10-24 DIAGNOSIS — G5603 Carpal tunnel syndrome, bilateral upper limbs: Secondary | ICD-10-CM

## 2021-10-24 DIAGNOSIS — E559 Vitamin D deficiency, unspecified: Secondary | ICD-10-CM

## 2021-10-24 MED ORDER — LORAZEPAM 1 MG PO TABS: 1.0000 mg | ORAL_TABLET | Freq: Three times a day (TID) | ORAL | 2 refills | Status: DC | PRN

## 2021-10-24 NOTE — Assessment & Plan Note (Signed)
Refilled today for severe GAD ?

## 2021-10-24 NOTE — Assessment & Plan Note (Addendum)
She reports not following up with neurology and wearing her wrist splint.  ? ?

## 2021-10-24 NOTE — Assessment & Plan Note (Signed)
resolved 

## 2021-10-24 NOTE — Patient Instructions (Signed)
.  I appreciate the opportunity to provide care to you today! ? ?  ?Follow up:  3 months ? ?Refill: Ativan  ? ?Please continue to a heart-healthy diet and increase your physical activities. Try to exercise for 60mns at least three times a week.  ? ? ?  ?It was a pleasure to see you and I look forward to continuing to work together on your health and well-being. ?Please do not hesitate to call the office if you need care or have questions about your care. ?  ?Have a wonderful day and week. ?With Gratitude, ?GAlvira MondayMSN, FNP-BC ? ? ?

## 2021-10-24 NOTE — Assessment & Plan Note (Addendum)
Resolved ?She reports not eating well while caring for her mother, leading to her GERD symptoms. ?

## 2021-10-24 NOTE — Assessment & Plan Note (Addendum)
Well-controlled with no active flair-ups reported ? ? ? ?

## 2021-10-24 NOTE — Assessment & Plan Note (Addendum)
Not taking Vit D,  ?Reports only taking centrum silver women 50+  ?

## 2021-10-24 NOTE — Progress Notes (Signed)
? ?Established Patient Office Visit ? ?Subjective:  ?Patient ID: Allison Dickerson, female    DOB: 23-May-1956  Age: 66 y.o. MRN: [id number] ? ?CC:  ?Chief Complaint  ?Patient presents with  ? Follow-up  ?  Needs refills. No complaints.   ? ? ?HPI ?Allison Dickerson is a 66 y.o. female with past medical history of GERD, IBS, and chronic diarrhea presents for f/u of chronic medical conditions. ? ?IBS: she reports that her symptoms are well managed. She has not had any recent diarrhea or abdominal pain. ? ?GAD: she denies SI and HI. Her symptoms are well managed on Ativan. She reports that her husband is helping out as much as possible. ? ?Vit. D deficiency: reports not taking her Vit D supplement. The only vitamins she has been taking are centrum silver women. ? ?  ? ?Past Medical History:  ?Diagnosis Date  ? Anxiety   ? Arthritis   ? Phreesia 06/24/2020  ? Cervical cancer ([place])   ? When younger.  ? Diverticulitis   ? Pinched nerve   ? right hand  ? ? ?Past Surgical History:  ?Procedure Laterality Date  ? ABDOMINAL HYSTERECTOMY    ? BIOPSY  10/24/2020  ? Procedure: BIOPSY;  Surgeon: Montez Morita, Quillian Quince, MD;  [facility];  Service: Gastroenterology;;  ? cervical cancer removal    ? COLONOSCOPY WITH PROPOFOL N/A 10/24/2020  ? Procedure: COLONOSCOPY WITH PROPOFOL;  Surgeon: Harvel Quale, MD;  [facility];  Service: Gastroenterology;  Laterality: N/A;  9:00 am  ? ESOPHAGOGASTRODUODENOSCOPY (EGD) WITH PROPOFOL N/A 10/24/2020  ? Procedure: ESOPHAGOGASTRODUODENOSCOPY (EGD) WITH PROPOFOL;  Surgeon: Harvel Quale, MD;  [facility];  Service: Gastroenterology;  Laterality: N/A;  ? EYE SURGERY N/A   ? Phreesia 06/24/2020  ? POLYPECTOMY  10/24/2020  ? Procedure: POLYPECTOMY;  Surgeon: Harvel Quale, MD;  [facility];  Service: Gastroenterology;;  ? ? ?Family History  ?Problem Relation Age of Onset  ? Cancer Father   ? Alcohol abuse Sister   ?  Cancer Other   ? ? ?Social History  ? ?Socioeconomic History  ? Marital status: Married  ?  Spouse name: Not on file  ? Number of children: Not on file  ? Years of education: Not on file  ? Highest education level: Not on file  ?Occupational History  ? Occupation: retired  ?Tobacco Use  ? Smoking status: Former  ?  Packs/day: 0.50  ?  Years: 20.00  ?  Pack years: 10.00  ?  Types: Cigarettes  ? Smokeless tobacco: Never  ?Vaping Use  ? Vaping Use: Never used  ?Substance and Sexual Activity  ? Alcohol use: No  ? Drug use: Yes  ?  Types: Marijuana  ?  Comment: occ with gummies  ? Sexual activity: Not Currently  ?  Birth control/protection: Surgical  ?  Comment: hyst  ?Other Topics Concern  ? Not on file  ?Social History Narrative  ? Not on file  ? ?Social Determinants of Health  ? ?Financial Resource Strain: Low Risk   ? Difficulty of Paying Living Expenses: Not hard at all  ?Food Insecurity: No Food Insecurity  ? Worried About Charity fundraiser in the Last Year: Never true  ? Ran Out of Food in the Last Year: Never true  ?Transportation Needs: No Transportation Needs  ? Lack of Transportation (Medical): No  ? Lack of Transportation (Non-Medical): No  ?Physical Activity: Inactive  ?  Days of Exercise per Week: 0 days  ? Minutes of Exercise per Session: 0 min  ?Stress: Stress Concern Present  ? Feeling of Stress : To some extent  ?Social Connections: Moderately Isolated  ? Frequency of Communication with Friends and Family: More than three times a week  ? Frequency of Social Gatherings with Friends and Family: More than three times a week  ? Attends Religious Services: Never  ? Active Member of Clubs or Organizations: No  ? Attends Archivist Meetings: Never  ? Marital Status: Married  ?Intimate Partner Violence: Not At Risk  ? Fear of Current or Ex-Partner: No  ? Emotionally Abused: No  ? Physically Abused: No  ? Sexually Abused: No  ? ? ?Outpatient Medications Prior to Visit  ?Medication Sig Dispense Refill   ? acetaminophen (TYLENOL) 500 MG tablet Take 500 mg by mouth every 8 (eight) hours as needed for mild pain or headache.    ? betamethasone dipropionate 0.05 % cream betamethasone dipropionate 0.05 % topical cream    ? Cyanocobalamin (VITAMIN B-12 PO) Take 1 tablet by mouth daily.    ? estradiol (ESTRACE) 0.1 MG/GM vaginal cream estradiol 0.01% (0.1 mg/gram) vaginal cream ? USE 0.5 GRAM VAGINALLY DAILY AT BEDTIME FOR 2 WEEKS THEN USE VAGINALLY 2 TO 3 TIMES WEEKLY    ? Multiple Vitamin (MULTIVITAMIN WITH MINERALS) TABS tablet Take 1 tablet by mouth daily.    ? LORazepam (ATIVAN) 1 MG tablet Take 1 tablet (1 mg total) by mouth every 8 (eight) hours as needed for anxiety. 90 tablet 2  ? ?No facility-administered medications prior to visit.  ? ? ?No Known Allergies ? ?ROS ?Review of Systems  ?Constitutional:  Negative for chills, fatigue and fever.  ?HENT:  Negative for sinus pressure, sinus pain and sore throat.   ?Eyes:  Negative for photophobia, pain and redness.  ?Respiratory:  Negative for chest tightness, shortness of breath and wheezing.   ?Cardiovascular:  Negative for chest pain and palpitations.  ?Gastrointestinal:  Negative for constipation, diarrhea, nausea and vomiting.  ?Endocrine: Negative for polydipsia, polyphagia and polyuria.  ?Genitourinary:  Negative for frequency and urgency.  ?Musculoskeletal:  Positive for arthralgias. Negative for back pain and neck pain.  ?Neurological:  Negative for dizziness, weakness and headaches.  ? ?  ?Objective:  ?  ?Physical Exam ?Constitutional:   ?   Appearance: Normal appearance.  ?HENT:  ?   Head: Normocephalic.  ?   Mouth/Throat:  ?   Comments: Missing second and third molars ?Cardiovascular:  ?   Rate and Rhythm: Normal rate and regular rhythm.  ?   Pulses: Normal pulses.  ?   Heart sounds: Normal heart sounds.  ?Pulmonary:  ?   Effort: Pulmonary effort is normal.  ?   Breath sounds: Normal breath sounds.  ?Musculoskeletal:  ?   Cervical back: No rigidity.  ?    Comments: She has her Wrist brace on her right hand. She noted severe arthritis in this right hand compared to the left. ?  ?Skin: ?   General: Skin is warm.  ?Neurological:  ?   Mental Status: She is alert and oriented to person, place, and time.  ?Psychiatric:  ?   Comments: Nomal affect  ? ? ?BP 120/62   Pulse 78   Ht '5\' 7"'  (1.702 m)   Wt 116 lb 12.8 oz (53 kg)   SpO2 98%   BMI 18.29 kg/m?  ?Wt Readings from Last 3 Encounters:  ?10/24/21 116 lb  12.8 oz (53 kg)  ?07/24/21 116 lb 1.9 oz (52.7 kg)  ?04/17/21 115 lb (52.2 kg)  ? ? ?Lab Results  ?Component Value Date  ? TSH 1.860 09/20/2020  ? ?Lab Results  ?Component Value Date  ? WBC 9.2 02/06/2021  ? HGB 12.7 02/06/2021  ? HCT 37.9 02/06/2021  ? MCV 93.3 02/06/2021  ? PLT 242 02/06/2021  ? ?Lab Results  ?Component Value Date  ? NA 142 04/17/2021  ? K 4.3 04/17/2021  ? CO2 22 04/17/2021  ? GLUCOSE 112 (H) 04/17/2021  ? BUN 16 04/17/2021  ? CREATININE 0.96 04/17/2021  ? BILITOT 0.3 04/17/2021  ? ALKPHOS 72 04/17/2021  ? AST 21 04/17/2021  ? ALT 18 04/17/2021  ? PROT 7.3 04/17/2021  ? ALBUMIN 4.5 04/17/2021  ? CALCIUM 10.2 04/17/2021  ? ANIONGAP 8 02/06/2021  ? EGFR 65 04/17/2021  ? ?Lab Results  ?Component Value Date  ? CHOL 250 (H) 09/20/2020  ? ?Lab Results  ?Component Value Date  ? HDL 83 09/20/2020  ? ?Lab Results  ?Component Value Date  ? LDLCALC 146 (H) 09/20/2020  ? ?Lab Results  ?Component Value Date  ? TRIG 122 09/20/2020  ? ?Lab Results  ?Component Value Date  ? CHOLHDL 3.0 09/20/2020  ? ?Lab Results  ?Component Value Date  ? HGBA1C 5.7 (H) 04/17/2021  ? ? ?  ?Assessment & Plan:  ? ?Problem List Items Addressed This Visit   ? ?  ? Digestive  ? Chronic diarrhea  ?  resolved ? ?  ?  ?  ? Nervous and Auditory  ? Bilateral carpal tunnel syndrome  ?  She reports not following up with neurology and wearing her wrist splint.  ? ? ?  ?  ? Relevant Medications  ? LORazepam (ATIVAN) 1 MG tablet  ?  ? Other  ? Vitamin D deficiency  ?  Not taking Vit D,  ?Reports  only taking centrum silver women 50+  ? ?  ?  ? Chronic prescription benzodiazepine use  ?  Refilled today for severe GAD ? ?  ?  ? ?Other Visit Diagnoses   ? ? Anxiety      ? Relevant Medications  ? LORazepam (

## 2021-10-30 ENCOUNTER — Ambulatory Visit: Payer: Medicare Other | Admitting: Internal Medicine

## 2022-01-22 ENCOUNTER — Ambulatory Visit (INDEPENDENT_AMBULATORY_CARE_PROVIDER_SITE_OTHER): Payer: Medicare Other | Admitting: Internal Medicine

## 2022-01-22 ENCOUNTER — Other Ambulatory Visit: Payer: Self-pay | Admitting: Family Medicine

## 2022-01-22 ENCOUNTER — Encounter: Payer: Self-pay | Admitting: Internal Medicine

## 2022-01-22 VITALS — BP 102/62 | HR 86 | Resp 18 | Ht 67.0 in | Wt 117.4 lb

## 2022-01-22 DIAGNOSIS — E559 Vitamin D deficiency, unspecified: Secondary | ICD-10-CM

## 2022-01-22 DIAGNOSIS — F419 Anxiety disorder, unspecified: Secondary | ICD-10-CM

## 2022-01-22 DIAGNOSIS — Z79899 Other long term (current) drug therapy: Secondary | ICD-10-CM | POA: Diagnosis not present

## 2022-01-22 DIAGNOSIS — F411 Generalized anxiety disorder: Secondary | ICD-10-CM | POA: Diagnosis not present

## 2022-01-22 DIAGNOSIS — E782 Mixed hyperlipidemia: Secondary | ICD-10-CM

## 2022-01-22 DIAGNOSIS — K580 Irritable bowel syndrome with diarrhea: Secondary | ICD-10-CM

## 2022-01-22 DIAGNOSIS — R7303 Prediabetes: Secondary | ICD-10-CM | POA: Diagnosis not present

## 2022-01-22 DIAGNOSIS — Z1159 Encounter for screening for other viral diseases: Secondary | ICD-10-CM

## 2022-01-22 NOTE — Patient Instructions (Signed)
Please continue taking medications as prescribed.   Please get fasting blood tests done before the next visit. 

## 2022-01-22 NOTE — Assessment & Plan Note (Signed)
Now resolved Has tried Bentyl and Levsin, did not provide relief Takes ginger now

## 2022-01-25 NOTE — Assessment & Plan Note (Signed)
Lab Results  Component Value Date   HGBA1C 5.7 (H) 04/17/2021   Advised to follow low carb diet DASH diet material provided

## 2022-01-25 NOTE — Assessment & Plan Note (Signed)
Lipid profile reviewed Advised to follow low cholesterol diet for now

## 2022-01-25 NOTE — Assessment & Plan Note (Signed)
Had caregiver stress as well, has been stressed due to loss of her mother recently and her right hand pain On Lorazepam 1 mg TID, has been on Benzodiazepine for more than 15 years Did not tolerate Wellbutrin in the past with her previous PCP Did not like Buspar 

## 2022-01-25 NOTE — Assessment & Plan Note (Signed)
For severe GAD Will check Toxassure in the next visit Advised strongly to avoid marijuana use

## 2022-01-25 NOTE — Progress Notes (Signed)
Established Patient Office Visit  Subjective:  Patient ID: Allison Dickerson, female    DOB: 1955/07/16  Age: 66 y.o. MRN: [id number]  CC:  Chief Complaint  Patient presents with   Follow-up    3 month follow up left jaw is swollen due to a tooth she has been trying vinegar and blueberrys     HPI Allison Dickerson is a 66 y.o. female with past medical history of GERD, polyarthritis, severe anxiety and pulmonary nodule who presents for f/u of her chronic medical conditions.  GAD: She takes Ativan 1 mg 3 times daily for anxiety.  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  She is stressed currently due to her chronic right hand pain and recent left jaw pain.  She is going to have dentures done.  She wants to talk about her preventive tests in the next visit.   Past Medical History:  Diagnosis Date   Anxiety    Arthritis    Phreesia 06/24/2020   Cervical cancer ([place])    When younger.   Diverticulitis    Pinched nerve    right hand    Past Surgical History:  Procedure Laterality Date   ABDOMINAL HYSTERECTOMY     BIOPSY  10/24/2020   Procedure: BIOPSY;  Surgeon: Montez Morita, Quillian Quince, MD;  [facility];  Service: Gastroenterology;;   cervical cancer removal     COLONOSCOPY WITH PROPOFOL N/A 10/24/2020   Procedure: COLONOSCOPY WITH PROPOFOL;  Surgeon: Harvel Quale, MD;  [facility];  Service: Gastroenterology;  Laterality: N/A;  9:00 am   ESOPHAGOGASTRODUODENOSCOPY (EGD) WITH PROPOFOL N/A 10/24/2020   Procedure: ESOPHAGOGASTRODUODENOSCOPY (EGD) WITH PROPOFOL;  Surgeon: Harvel Quale, MD;  [facility];  Service: Gastroenterology;  Laterality: N/A;   EYE SURGERY N/A    Phreesia 06/24/2020   POLYPECTOMY  10/24/2020   Procedure: POLYPECTOMY;  Surgeon: Montez Morita, Quillian Quince, MD;  [facility];  Service: Gastroenterology;;     Family History  Problem Relation Age of Onset   Cancer Father    Alcohol abuse Sister    Cancer Other     Social History   Socioeconomic History   Marital status: Married    Spouse name: Not on file   Number of children: Not on file   Years of education: Not on file   Highest education level: Not on file  Occupational History   Occupation: retired  Tobacco Use   Smoking status: Former    Packs/day: 0.50    Years: 20.00    Total pack years: 10.00    Types: Cigarettes   Smokeless tobacco: Never  Vaping Use   Vaping Use: Never used  Substance and Sexual Activity   Alcohol use: No   Drug use: Yes    Types: Marijuana    Comment: occ with gummies   Sexual activity: Not Currently    Birth control/protection: Surgical    Comment: hyst  Other Topics Concern   Not on file  Social History Narrative   Not on file   Social Determinants of Health   Financial Resource Strain: Low Risk  (04/17/2021)   Overall Financial Resource Strain (CARDIA)    Difficulty of Paying Living Expenses: Not hard at all  Food Insecurity: No Food Insecurity (04/17/2021)   Hunger Vital Sign    Worried About Running Out of Food in the Last Year: Never true  Ran Out of Food in the Last Year: Never true  Transportation Needs: No Transportation Needs (04/17/2021)   PRAPARE - Hydrologist (Medical): No    Lack of Transportation (Non-Medical): No  Physical Activity: Inactive (04/17/2021)   Exercise Vital Sign    Days of Exercise per Week: 0 days    Minutes of Exercise per Session: 0 min  Stress: Stress Concern Present (04/17/2021)   [employer]    Feeling of Stress : To some extent  Social Connections: Moderately Isolated (04/17/2021)   Social Connection and Isolation Panel [NHANES]    Frequency of Communication with Friends and Family: More than three times a week    Frequency of Social Gatherings with  Friends and Family: More than three times a week    Attends Religious Services: Never    Marine scientist or Organizations: No    Attends Archivist Meetings: Never    Marital Status: Married  Human resources officer Violence: Not At Risk (04/17/2021)   Humiliation, Afraid, Rape, and Kick questionnaire    Fear of Current or Ex-Partner: No    Emotionally Abused: No    Physically Abused: No    Sexually Abused: No    Outpatient Medications Prior to Visit  Medication Sig Dispense Refill   acetaminophen (TYLENOL) 500 MG tablet Take 500 mg by mouth every 8 (eight) hours as needed for mild pain or headache.     betamethasone dipropionate 0.05 % cream betamethasone dipropionate 0.05 % topical cream     Cyanocobalamin (VITAMIN B-12 PO) Take 1 tablet by mouth daily.     estradiol (ESTRACE) 0.1 MG/GM vaginal cream estradiol 0.01% (0.1 mg/gram) vaginal cream  USE 0.5 GRAM VAGINALLY DAILY AT BEDTIME FOR 2 WEEKS THEN USE VAGINALLY 2 TO 3 TIMES WEEKLY     Multiple Vitamin (MULTIVITAMIN WITH MINERALS) TABS tablet Take 1 tablet by mouth daily.     LORazepam (ATIVAN) 1 MG tablet Take 1 tablet (1 mg total) by mouth every 8 (eight) hours as needed for anxiety. 90 tablet 2   No facility-administered medications prior to visit.    No Known Allergies  ROS Review of Systems  Constitutional:  Negative for chills and fever.  HENT:  Negative for congestion, sinus pressure, sinus pain and sore throat.   Eyes:  Negative for pain and discharge.  Respiratory:  Negative for cough and shortness of breath.   Cardiovascular:  Negative for chest pain and palpitations.  Gastrointestinal:  Negative for abdominal pain, constipation, nausea and vomiting.  Endocrine: Negative for polydipsia and polyuria.  Genitourinary:  Negative for dysuria and hematuria.  Musculoskeletal:  Positive for arthralgias (Right wrist). Negative for neck pain and neck stiffness.  Skin:  Negative for rash.  Neurological:  Positive  for weakness and numbness (Right hand). Negative for dizziness and speech difficulty.  Psychiatric/Behavioral:  Positive for sleep disturbance. Negative for agitation, behavioral problems, dysphoric mood and suicidal ideas. The patient is nervous/anxious.       Objective:    Physical Exam Vitals reviewed.  Constitutional:      General: She is not in acute distress.    Appearance: She is not diaphoretic.  HENT:     Head: Normocephalic and atraumatic.     Nose: Nose normal.     Mouth/Throat:     Mouth: Mucous membranes are moist.  Eyes:     General: No scleral icterus.    Extraocular Movements: Extraocular  movements intact.  Cardiovascular:     Rate and Rhythm: Normal rate and regular rhythm.     Pulses: Normal pulses.     Heart sounds: Normal heart sounds. No murmur heard. Pulmonary:     Breath sounds: Normal breath sounds. No wheezing or rales.  Musculoskeletal:     Cervical back: Neck supple. No tenderness.     Right lower leg: No edema.     Left lower leg: No edema.     Comments: Right wrist brace in place  Skin:    General: Skin is warm.     Findings: No rash.  Neurological:     General: No focal deficit present.     Mental Status: She is alert and oriented to person, place, and time.     Sensory: No sensory deficit.     Motor: No weakness.  Psychiatric:        Mood and Affect: Mood normal.        Behavior: Behavior normal.     BP 102/62 (BP Location: Left Arm, Patient Position: Sitting, Cuff Size: Normal)   Pulse 86   Resp 18   Ht '5\' 7"'  (1.702 m)   Wt 117 lb 6.4 oz (53.3 kg)   SpO2 100%   BMI 18.39 kg/m  Wt Readings from Last 3 Encounters:  01/22/22 117 lb 6.4 oz (53.3 kg)  10/24/21 116 lb 12.8 oz (53 kg)  07/24/21 116 lb 1.9 oz (52.7 kg)    Lab Results  Component Value Date   TSH 1.860 09/20/2020   Lab Results  Component Value Date   WBC 9.2 02/06/2021   HGB 12.7 02/06/2021   HCT 37.9 02/06/2021   MCV 93.3 02/06/2021   PLT 242 02/06/2021    Lab Results  Component Value Date   NA 142 04/17/2021   K 4.3 04/17/2021   CO2 22 04/17/2021   GLUCOSE 112 (H) 04/17/2021   BUN 16 04/17/2021   CREATININE 0.96 04/17/2021   BILITOT 0.3 04/17/2021   ALKPHOS 72 04/17/2021   AST 21 04/17/2021   ALT 18 04/17/2021   PROT 7.3 04/17/2021   ALBUMIN 4.5 04/17/2021   CALCIUM 10.2 04/17/2021   ANIONGAP 8 02/06/2021   EGFR 65 04/17/2021   Lab Results  Component Value Date   CHOL 250 (H) 09/20/2020   Lab Results  Component Value Date   HDL 83 09/20/2020   Lab Results  Component Value Date   LDLCALC 146 (H) 09/20/2020   Lab Results  Component Value Date   TRIG 122 09/20/2020   Lab Results  Component Value Date   CHOLHDL 3.0 09/20/2020   Lab Results  Component Value Date   HGBA1C 5.7 (H) 04/17/2021      Assessment & Plan:   Problem List Items Addressed This Visit       Digestive   IBS (irritable bowel syndrome)    Now resolved Has tried Bentyl and Levsin, did not provide relief Takes ginger now      Relevant Orders   CBC with Differential/Platelet     Other   GAD (generalized anxiety disorder) - Primary    Had caregiver stress as well, has been stressed due to loss of her mother recently and her right hand pain On Lorazepam 1 mg TID, has been on Benzodiazepine for more than 15 years Did not tolerate Wellbutrin in the past with her previous PCP Did not like Buspar      Relevant Orders   TSH   CMP14+EGFR  ToxASSURE Select 13 (MW), Urine   Prediabetes    Lab Results  Component Value Date   HGBA1C 5.7 (H) 04/17/2021  Advised to follow low carb diet DASH diet material provided      Relevant Orders   Hemoglobin A1c   Vitamin D deficiency   Relevant Orders   VITAMIN D 25 Hydroxy (Vit-D Deficiency, Fractures)   Mixed hyperlipidemia    Lipid profile reviewed Advised to follow low cholesterol diet for now      Relevant Orders   Lipid panel   Chronic prescription benzodiazepine use    For severe  GAD Will check Toxassure in the next visit Advised strongly to avoid marijuana use      Relevant Orders   ToxASSURE Select 13 (MW), Urine   Other Visit Diagnoses     Need for hepatitis C screening test       Relevant Orders   Hepatitis C Antibody       No orders of the defined types were placed in this encounter.   Follow-up: Return in about 3 months (around 04/24/2022) for GAD.    Lindell Spar, MD

## 2022-02-15 LAB — CBC WITH DIFFERENTIAL/PLATELET
Basophils Absolute: 0.1 10*3/uL (ref 0.0–0.2)
Basos: 1 %
EOS (ABSOLUTE): 0.2 10*3/uL (ref 0.0–0.4)
Eos: 2 %
Hematocrit: 39.8 % (ref 34.0–46.6)
Hemoglobin: 13.3 g/dL (ref 11.1–15.9)
Immature Grans (Abs): 0 10*3/uL (ref 0.0–0.1)
Immature Granulocytes: 0 %
Lymphocytes Absolute: 1.4 10*3/uL (ref 0.7–3.1)
Lymphs: 16 %
MCH: 30 pg (ref 26.6–33.0)
MCHC: 33.4 g/dL (ref 31.5–35.7)
MCV: 90 fL (ref 79–97)
Monocytes Absolute: 0.5 10*3/uL (ref 0.1–0.9)
Monocytes: 6 %
Neutrophils Absolute: 6.6 10*3/uL (ref 1.4–7.0)
Neutrophils: 75 %
Platelets: 287 10*3/uL (ref 150–450)
RBC: 4.44 x10E6/uL (ref 3.77–5.28)
RDW: 12.1 % (ref 11.7–15.4)
WBC: 8.7 10*3/uL (ref 3.4–10.8)

## 2022-02-15 LAB — CMP14+EGFR
ALT: 19 IU/L (ref 0–32)
AST: 22 IU/L (ref 0–40)
Albumin/Globulin Ratio: 1.7 (ref 1.2–2.2)
Albumin: 4.5 g/dL (ref 3.9–4.9)
Alkaline Phosphatase: 85 IU/L (ref 44–121)
BUN/Creatinine Ratio: 20 (ref 12–28)
BUN: 19 mg/dL (ref 8–27)
Bilirubin Total: 0.5 mg/dL (ref 0.0–1.2)
CO2: 20 mmol/L (ref 20–29)
Calcium: 10.2 mg/dL (ref 8.7–10.3)
Chloride: 105 mmol/L (ref 96–106)
Creatinine, Ser: 0.94 mg/dL (ref 0.57–1.00)
Globulin, Total: 2.6 g/dL (ref 1.5–4.5)
Glucose: 101 mg/dL — ABNORMAL HIGH (ref 70–99)
Potassium: 4.6 mmol/L (ref 3.5–5.2)
Sodium: 143 mmol/L (ref 134–144)
Total Protein: 7.1 g/dL (ref 6.0–8.5)
eGFR: 67 mL/min/{1.73_m2} (ref >59)

## 2022-02-15 LAB — HEPATITIS C ANTIBODY: Hep C Virus Ab: NONREACTIVE

## 2022-02-15 LAB — HEMOGLOBIN A1C
Est. average glucose Bld gHb Est-mCnc: 120 mg/dL
Hgb A1c MFr Bld: 5.8 % — ABNORMAL HIGH (ref 4.8–5.6)

## 2022-02-15 LAB — TSH: TSH: 1.47 u[IU]/mL (ref 0.450–4.500)

## 2022-02-15 LAB — LIPID PANEL
Chol/HDL Ratio: 3 ratio (ref 0.0–4.4)
Cholesterol, Total: 225 mg/dL — ABNORMAL HIGH (ref 100–199)
HDL: 76 mg/dL (ref >39)
LDL Chol Calc (NIH): 136 mg/dL — ABNORMAL HIGH (ref 0–99)
Triglycerides: 73 mg/dL (ref 0–149)
VLDL Cholesterol Cal: 13 mg/dL (ref 5–40)

## 2022-02-15 LAB — VITAMIN D 25 HYDROXY (VIT D DEFICIENCY, FRACTURES): Vit D, 25-Hydroxy: 37.7 ng/mL (ref 30.0–100.0)

## 2022-02-16 LAB — TOXASSURE SELECT 13 (MW), URINE

## 2022-04-22 ENCOUNTER — Encounter: Payer: Medicare Other | Admitting: Internal Medicine

## 2022-04-26 ENCOUNTER — Encounter: Payer: Self-pay | Admitting: Internal Medicine

## 2022-04-26 ENCOUNTER — Ambulatory Visit (INDEPENDENT_AMBULATORY_CARE_PROVIDER_SITE_OTHER): Payer: Medicare Other | Admitting: Internal Medicine

## 2022-04-26 ENCOUNTER — Other Ambulatory Visit: Payer: Self-pay | Admitting: Internal Medicine

## 2022-04-26 VITALS — BP 128/75 | HR 75 | Ht 67.0 in | Wt 115.6 lb

## 2022-04-26 DIAGNOSIS — R7303 Prediabetes: Secondary | ICD-10-CM

## 2022-04-26 DIAGNOSIS — F411 Generalized anxiety disorder: Secondary | ICD-10-CM | POA: Diagnosis not present

## 2022-04-26 DIAGNOSIS — F419 Anxiety disorder, unspecified: Secondary | ICD-10-CM

## 2022-04-26 DIAGNOSIS — E782 Mixed hyperlipidemia: Secondary | ICD-10-CM

## 2022-04-26 DIAGNOSIS — K219 Gastro-esophageal reflux disease without esophagitis: Secondary | ICD-10-CM

## 2022-04-26 DIAGNOSIS — G5603 Carpal tunnel syndrome, bilateral upper limbs: Secondary | ICD-10-CM

## 2022-04-26 NOTE — Assessment & Plan Note (Signed)
Had caregiver stress as well, has been stressed due to loss of her mother recently and her right hand pain On Lorazepam 1 mg TID, has been on Benzodiazepine for more than 15 years Did not tolerate Wellbutrin in the past with her previous PCP Did not like Buspar

## 2022-04-26 NOTE — Assessment & Plan Note (Signed)
Omeprazole as needed Advised to avoid oral NSAIDs

## 2022-04-26 NOTE — Patient Instructions (Signed)
Please continue taking medications as prescribed.   Please get fasting blood tests done before the next visit.

## 2022-04-26 NOTE — Assessment & Plan Note (Signed)
Followed by Neurology Gets steroid injections Wrist splint advised

## 2022-04-26 NOTE — Assessment & Plan Note (Signed)
Lipid profile reviewed Advised to follow low cholesterol diet for now

## 2022-04-26 NOTE — Assessment & Plan Note (Signed)
Lab Results  Component Value Date   HGBA1C 5.8 (H) 02/13/2022   Advised to follow low carb diet DASH diet material provided

## 2022-04-26 NOTE — Progress Notes (Signed)
Established Patient Office Visit  Subjective:  Patient ID: Allison Dickerson, female    DOB: May 08, 1956  Age: 66 y.o. MRN: [id number]  CC:  Chief Complaint  Patient presents with   Follow-up    Follow up, recently had teeth pulled     HPI ERRIKA Dickerson is a 66 y.o. female with past medical history of GERD, polyarthritis, severe anxiety and pulmonary nodule who presents for f/u of her chronic medical conditions.  GAD: She takes Ativan 1 mg 3 times daily for anxiety.  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  She is stressed currently due to her chronic right hand pain and recent jaw pain.  She recently had dental work-up done.  She wants to talk about her preventive tests in the next visit.   Past Medical History:  Diagnosis Date   Anxiety    Arthritis    Phreesia 06/24/2020   Cervical cancer ([place])    When younger.   Diverticulitis    Pinched nerve    right hand    Past Surgical History:  Procedure Laterality Date   ABDOMINAL HYSTERECTOMY     BIOPSY  10/24/2020   Procedure: BIOPSY;  Surgeon: Montez Morita, Quillian Quince, MD;  [facility];  Service: Gastroenterology;;   cervical cancer removal     COLONOSCOPY WITH PROPOFOL N/A 10/24/2020   Procedure: COLONOSCOPY WITH PROPOFOL;  Surgeon: Harvel Quale, MD;  [facility];  Service: Gastroenterology;  Laterality: N/A;  9:00 am   ESOPHAGOGASTRODUODENOSCOPY (EGD) WITH PROPOFOL N/A 10/24/2020   Procedure: ESOPHAGOGASTRODUODENOSCOPY (EGD) WITH PROPOFOL;  Surgeon: Harvel Quale, MD;  [facility];  Service: Gastroenterology;  Laterality: N/A;   EYE SURGERY N/A    Phreesia 06/24/2020   POLYPECTOMY  10/24/2020   Procedure: POLYPECTOMY;  Surgeon: Montez Morita, Quillian Quince, MD;  [facility];  Service: Gastroenterology;;    Family History  Problem Relation Age of Onset   Cancer Father     Alcohol abuse Sister    Cancer Other     Social History   Socioeconomic History   Marital status: Married    Spouse name: Not on file   Number of children: Not on file   Years of education: Not on file   Highest education level: Not on file  Occupational History   Occupation: retired  Tobacco Use   Smoking status: Former    Packs/day: 0.50    Years: 20.00    Total pack years: 10.00    Types: Cigarettes   Smokeless tobacco: Never  Vaping Use   Vaping Use: Never used  Substance and Sexual Activity   Alcohol use: No   Drug use: Yes    Types: Marijuana    Comment: occ with gummies   Sexual activity: Not Currently    Birth control/protection: Surgical    Comment: hyst  Other Topics Concern   Not on file  Social History Narrative   Not on file   Social Determinants of Health   Financial Resource Strain: Low Risk  (04/17/2021)   Overall Financial Resource Strain (CARDIA)    Difficulty of Paying Living Expenses: Not hard at all  Food Insecurity: No Food Insecurity (04/17/2021)   Hunger Vital Sign    Worried About Running Out of Food in the Last Year: Never true    [place] in the Last Year: Never true  Transportation  Needs: No Transportation Needs (04/17/2021)   PRAPARE - Hydrologist (Medical): No    Lack of Transportation (Non-Medical): No  Physical Activity: Inactive (04/17/2021)   Exercise Vital Sign    Days of Exercise per Week: 0 days    Minutes of Exercise per Session: 0 min  Stress: Stress Concern Present (04/17/2021)   [place]    Feeling of Stress : To some extent  Social Connections: Moderately Isolated (04/17/2021)   Social Connection and Isolation Panel [NHANES]    Frequency of Communication with Friends and Family: More than three times a week    Frequency of Social Gatherings with Friends and Family: More than three times a week    Attends Religious  Services: Never    Marine scientist or Organizations: No    Attends Archivist Meetings: Never    Marital Status: Married  Human resources officer Violence: Not At Risk (04/17/2021)   Humiliation, Afraid, Rape, and Kick questionnaire    Fear of Current or Ex-Partner: No    Emotionally Abused: No    Physically Abused: No    Sexually Abused: No    Outpatient Medications Prior to Visit  Medication Sig Dispense Refill   acetaminophen (TYLENOL) 500 MG tablet Take 500 mg by mouth every 8 (eight) hours as needed for mild pain or headache.     betamethasone dipropionate 0.05 % cream betamethasone dipropionate 0.05 % topical cream     Cyanocobalamin (VITAMIN B-12 PO) Take 1 tablet by mouth daily.     ibuprofen (ADVIL) 800 MG tablet Take 800 mg by mouth.     Multiple Vitamin (MULTIVITAMIN WITH MINERALS) TABS tablet Take 1 tablet by mouth daily.     LORazepam (ATIVAN) 1 MG tablet TAKE 1 TABLET(1 MG) BY MOUTH EVERY 8 HOURS AS NEEDED FOR ANXIETY 90 tablet 2   estradiol (ESTRACE) 0.1 MG/GM vaginal cream estradiol 0.01% (0.1 mg/gram) vaginal cream  USE 0.5 GRAM VAGINALLY DAILY AT BEDTIME FOR 2 WEEKS THEN USE VAGINALLY 2 TO 3 TIMES WEEKLY (Patient not taking: Reported on 04/26/2022)     No facility-administered medications prior to visit.    No Known Allergies  ROS Review of Systems  Constitutional:  Negative for chills and fever.  HENT:  Negative for congestion, sinus pressure, sinus pain and sore throat.   Eyes:  Negative for pain and discharge.  Respiratory:  Negative for cough and shortness of breath.   Cardiovascular:  Negative for chest pain and palpitations.  Gastrointestinal:  Negative for abdominal pain, constipation, nausea and vomiting.  Endocrine: Negative for polydipsia and polyuria.  Genitourinary:  Negative for dysuria and hematuria.  Musculoskeletal:  Positive for arthralgias (Right wrist). Negative for neck pain and neck stiffness.  Skin:  Negative for rash.   Neurological:  Positive for weakness and numbness (Right hand). Negative for dizziness and speech difficulty.  Psychiatric/Behavioral:  Positive for sleep disturbance. Negative for agitation, behavioral problems, dysphoric mood and suicidal ideas. The patient is nervous/anxious.       Objective:    Physical Exam Vitals reviewed.  Constitutional:      General: She is not in acute distress.    Appearance: She is not diaphoretic.  HENT:     Head: Normocephalic and atraumatic.     Nose: Nose normal.     Mouth/Throat:     Mouth: Mucous membranes are moist.  Eyes:     General: No scleral icterus.  Extraocular Movements: Extraocular movements intact.  Cardiovascular:     Rate and Rhythm: Normal rate and regular rhythm.     Pulses: Normal pulses.     Heart sounds: Normal heart sounds. No murmur heard. Pulmonary:     Breath sounds: Normal breath sounds. No wheezing or rales.  Musculoskeletal:     Cervical back: Neck supple. No tenderness.     Right lower leg: No edema.     Left lower leg: No edema.     Comments: Right wrist brace in place  Skin:    General: Skin is warm.     Findings: No rash.  Neurological:     General: No focal deficit present.     Mental Status: She is alert and oriented to person, place, and time.     Sensory: No sensory deficit.     Motor: No weakness.  Psychiatric:        Mood and Affect: Mood normal.        Behavior: Behavior normal.     BP 128/75 (BP Location: Right Arm, Patient Position: Sitting, Cuff Size: Normal)   Pulse 75   Ht _0  (1.702 m)   Wt 115 lb 9.6 oz (52.4 kg)   SpO2 98%   BMI 18.11 kg/m  Wt Readings from Last 3 Encounters:  04/26/22 115 lb 9.6 oz (52.4 kg)  01/22/22 117 lb 6.4 oz (53.3 kg)  10/24/21 116 lb 12.8 oz (53 kg)    Lab Results  Component Value Date   TSH 1.470 02/13/2022   Lab Results  Component Value Date   WBC 8.7 02/13/2022   HGB 13.3 02/13/2022   HCT 39.8 02/13/2022   MCV 90 02/13/2022   PLT 287  02/13/2022   Lab Results  Component Value Date   NA 143 02/13/2022   K 4.6 02/13/2022   CO2 20 02/13/2022   GLUCOSE 101 (H) 02/13/2022   BUN 19 02/13/2022   CREATININE 0.94 02/13/2022   BILITOT 0.5 02/13/2022   ALKPHOS 85 02/13/2022   AST 22 02/13/2022   ALT 19 02/13/2022   PROT 7.1 02/13/2022   ALBUMIN 4.5 02/13/2022   CALCIUM 10.2 02/13/2022   ANIONGAP 8 02/06/2021   EGFR 67 02/13/2022   Lab Results  Component Value Date   CHOL 225 (H) 02/13/2022   Lab Results  Component Value Date   HDL 76 02/13/2022   Lab Results  Component Value Date   LDLCALC 136 (H) 02/13/2022   Lab Results  Component Value Date   TRIG 73 02/13/2022   Lab Results  Component Value Date   CHOLHDL 3.0 02/13/2022   Lab Results  Component Value Date   HGBA1C 5.8 (H) 02/13/2022      Assessment & Plan:   Problem List Items Addressed This Visit       Digestive   GERD (gastroesophageal reflux disease) - Primary    Omeprazole as needed Advised to avoid oral NSAIDs        Nervous and Auditory   Bilateral carpal tunnel syndrome    Followed by Neurology Gets steroid injections Wrist splint advised        Other   GAD (generalized anxiety disorder)    Had caregiver stress as well, has been stressed due to loss of her mother recently and her right hand pain On Lorazepam 1 mg TID, has been on Benzodiazepine for more than 15 years Did not tolerate Wellbutrin in the past with her previous PCP Did not like Buspar  Prediabetes    Lab Results  Component Value Date   HGBA1C 5.8 (H) 02/13/2022  Advised to follow low carb diet DASH diet material provided      Mixed hyperlipidemia    Lipid profile reviewed Advised to follow low cholesterol diet for now      No orders of the defined types were placed in this encounter.   Follow-up: Return in about 3 months (around 07/27/2022) for GAD.    Lindell Spar, MD

## 2022-04-30 ENCOUNTER — Ambulatory Visit: Payer: Medicare Other | Admitting: Internal Medicine

## 2022-05-13 ENCOUNTER — Encounter: Payer: Medicare Other | Admitting: Internal Medicine

## 2022-05-14 ENCOUNTER — Ambulatory Visit: Payer: Medicare Other | Admitting: Internal Medicine

## 2022-06-03 ENCOUNTER — Encounter: Payer: Medicare Other | Admitting: Internal Medicine

## 2022-06-20 ENCOUNTER — Ambulatory Visit: Payer: Medicare Other | Admitting: Orthopedic Surgery

## 2022-06-25 ENCOUNTER — Ambulatory Visit (INDEPENDENT_AMBULATORY_CARE_PROVIDER_SITE_OTHER): Payer: Medicare Other | Admitting: Orthopaedic Surgery

## 2022-06-25 ENCOUNTER — Encounter: Payer: Self-pay | Admitting: Orthopaedic Surgery

## 2022-06-25 VITALS — BP 115/70 | HR 88 | Ht 67.0 in | Wt 113.0 lb

## 2022-06-25 DIAGNOSIS — G5603 Carpal tunnel syndrome, bilateral upper limbs: Secondary | ICD-10-CM | POA: Diagnosis not present

## 2022-06-25 DIAGNOSIS — G5621 Lesion of ulnar nerve, right upper limb: Secondary | ICD-10-CM | POA: Diagnosis not present

## 2022-06-25 DIAGNOSIS — M67441 Ganglion, right hand: Secondary | ICD-10-CM | POA: Diagnosis not present

## 2022-06-25 NOTE — Progress Notes (Signed)
Subjective:    Patient ID: Allison Dickerson, female    DOB: [birth year], 67 y.o.   MRN: [id number]  HPI She has known carpal tunnel syndrome from studies done by Dr. Merlene Laughter 01-19-21.  He has since retired.  She did not want any surgery done.  Over the last month, she has developed numbness of the right little finger and weakness.  She has no trauma.  She has developed a cyst over the dorsum of the hand, mid hand also with no trauma.  She has no redness.  She has history of neck pain and DJD.  No recent X-rays have been done.   Review of Systems  Constitutional:  Positive for activity change.  Musculoskeletal:  Positive for arthralgias, myalgias and neck pain.  All other systems reviewed and are negative. For Review of Systems, all other systems reviewed and are negative.  The following is a summary of the past history medically, past history surgically, known current medicines, social history and family history.  This information is gathered electronically by the computer from prior information and documentation.  I review this each visit and have found including this information at this point in the chart is beneficial and informative.   Past Medical History:  Diagnosis Date   Anxiety    Arthritis    Phreesia 06/24/2020   Cervical cancer ([place])    When younger.   Diverticulitis    Pinched nerve    right hand    Past Surgical History:  Procedure Laterality Date   ABDOMINAL HYSTERECTOMY     BIOPSY  10/24/2020   Procedure: BIOPSY;  Surgeon: Montez Morita, Quillian Quince, MD;  [facility];  Service: Gastroenterology;;   cervical cancer removal     COLONOSCOPY WITH PROPOFOL N/A 10/24/2020   Procedure: COLONOSCOPY WITH PROPOFOL;  Surgeon: Harvel Quale, MD;  [facility];  Service: Gastroenterology;  Laterality: N/A;  9:00 am   ESOPHAGOGASTRODUODENOSCOPY (EGD) WITH PROPOFOL N/A 10/24/2020   Procedure: ESOPHAGOGASTRODUODENOSCOPY (EGD) WITH PROPOFOL;   Surgeon: Harvel Quale, MD;  [facility];  Service: Gastroenterology;  Laterality: N/A;   EYE SURGERY N/A    Phreesia 06/24/2020   POLYPECTOMY  10/24/2020   Procedure: POLYPECTOMY;  Surgeon: Harvel Quale, MD;  [facility];  Service: Gastroenterology;;    Current Outpatient Medications on File Prior to Visit  Medication Sig Dispense Refill   ibuprofen (ADVIL) 800 MG tablet Take 800 mg by mouth.     LORazepam (ATIVAN) 1 MG tablet TAKE 1 TABLETS(1 MG) BY MOUTH EVERY 8 HOURS AS NEEDED FOR ANXIETY 90 tablet 2   Multiple Vitamin (MULTIVITAMIN WITH MINERALS) TABS tablet Take 1 tablet by mouth daily.     No current facility-administered medications on file prior to visit.    Social History   Socioeconomic History   Marital status: Married    Spouse name: Not on file   Number of children: Not on file   Years of education: Not on file   Highest education level: Not on file  Occupational History   Occupation: retired  Tobacco Use   Smoking status: Former    Packs/day: 0.50    Years: 20.00    Total pack years: 10.00    Types: Cigarettes   Smokeless tobacco: Never  Vaping Use   Vaping Use: Never used  Substance and Sexual Activity   Alcohol use: No   Drug use: Yes    Types: Marijuana    Comment: occ  with gummies   Sexual activity: Not Currently    Birth control/protection: Surgical    Comment: hyst  Other Topics Concern   Not on file  Social History Narrative   Not on file   Social Determinants of Health   Financial Resource Strain: Low Risk  (04/17/2021)   Overall Financial Resource Strain (CARDIA)    Difficulty of Paying Living Expenses: Not hard at all  Food Insecurity: No Food Insecurity (04/17/2021)   Hunger Vital Sign    Worried About Running Out of Food in the Last Year: Never true    Ran Out of Food in the Last Year: Never true  Transportation Needs: No Transportation Needs (04/17/2021)   PRAPARE - Armed forces logistics/support/administrative officer (Medical): No    Lack of Transportation (Non-Medical): No  Physical Activity: Inactive (04/17/2021)   Exercise Vital Sign    Days of Exercise per Week: 0 days    Minutes of Exercise per Session: 0 min  Stress: Stress Concern Present (04/17/2021)   [place]    Feeling of Stress : To some extent  Social Connections: Moderately Isolated (04/17/2021)   Social Connection and Isolation Panel [NHANES]    Frequency of Communication with Friends and Family: More than three times a week    Frequency of Social Gatherings with Friends and Family: More than three times a week    Attends Religious Services: Never    Marine scientist or Organizations: No    Attends Archivist Meetings: Never    Marital Status: Married  Human resources officer Violence: Not At Risk (04/17/2021)   Humiliation, Afraid, Rape, and Kick questionnaire    Fear of Current or Ex-Partner: No    Emotionally Abused: No    Physically Abused: No    Sexually Abused: No    Family History  Problem Relation Age of Onset   Cancer Father    Alcohol abuse Sister    Cancer Other     BP 115/70   Pulse 88   Ht '5\' 7"'$  (1.702 m)   Wt 113 lb (51.3 kg)   BMI 17.70 kg/m   Body mass index is 17.7 kg/m.      Objective:   Physical Exam Vitals and nursing note reviewed. Exam conducted with a chaperone present.  Constitutional:      Appearance: She is well-developed.  HENT:     Head: Normocephalic and atraumatic.  Eyes:     Conjunctiva/sclera: Conjunctivae normal.     Pupils: Pupils are equal, round, and reactive to light.  Cardiovascular:     Rate and Rhythm: Normal rate and regular rhythm.  Pulmonary:     Effort: Pulmonary effort is normal.  Abdominal:     Palpations: Abdomen is soft.  Musculoskeletal:       Hands:     Cervical back: Normal range of motion and neck supple.  Skin:    General: Skin is warm and dry.   Neurological:     Mental Status: She is alert and oriented to person, place, and time.     Cranial Nerves: No cranial nerve deficit.     Motor: No abnormal muscle tone.     Coordination: Coordination normal.     Deep Tendon Reflexes: Reflexes are normal and symmetric. Reflexes normal.  Psychiatric:        Behavior: Behavior normal.        Thought Content: Thought content normal.  Judgment: Judgment normal.           Assessment & Plan:   Encounter Diagnoses  Name Primary?   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  I will get new EMGs.  She has ulnar nerve tardy.  It could be from the wrist, elbow or neck.  She has no increased tenderness at elbow.  She cannot take NSAIDs.  Return in two weeks.  Call if any problem.  Precautions discussed.  Electronically Signed Sanjuana Kava, MD 1/9/20243:05 PM

## 2022-06-25 NOTE — Patient Instructions (Signed)
You have been referred to see [facility] PHONE: [phone] Vito Backers is their referral coordinator and she will call you to schedule your initial visit. If you haven't heard from that office in about 1 week, please call them and schedule your appointment.

## 2022-06-27 ENCOUNTER — Telehealth: Payer: Self-pay | Admitting: Physical Medicine and Rehabilitation

## 2022-06-27 NOTE — Telephone Encounter (Signed)
Spoke with husband and scheduled NCV for 07/09/22

## 2022-06-27 NOTE — Telephone Encounter (Signed)
Patient request a call back regarding a referral that's in epic.

## 2022-07-02 ENCOUNTER — Ambulatory Visit: Payer: Medicare Other | Admitting: Orthopaedic Surgery

## 2022-07-02 ENCOUNTER — Telehealth: Payer: Self-pay | Admitting: Physical Medicine and Rehabilitation

## 2022-07-02 NOTE — Telephone Encounter (Signed)
Called patient left message to return call concerning her mychart message

## 2022-07-04 ENCOUNTER — Encounter: Payer: Medicare Other | Admitting: Orthopaedic Surgery

## 2022-07-09 ENCOUNTER — Ambulatory Visit (INDEPENDENT_AMBULATORY_CARE_PROVIDER_SITE_OTHER): Payer: Medicare Other | Admitting: Physical Medicine and Rehabilitation

## 2022-07-09 DIAGNOSIS — M79642 Pain in left hand: Secondary | ICD-10-CM

## 2022-07-09 DIAGNOSIS — M67441 Ganglion, right hand: Secondary | ICD-10-CM | POA: Diagnosis not present

## 2022-07-09 DIAGNOSIS — R202 Paresthesia of skin: Secondary | ICD-10-CM | POA: Diagnosis not present

## 2022-07-09 DIAGNOSIS — R29898 Other symptoms and signs involving the musculoskeletal system: Secondary | ICD-10-CM | POA: Diagnosis not present

## 2022-07-09 NOTE — Progress Notes (Signed)
Functional Pain Scale - descriptive words and definitions  Immobilizing (10)   Unable to move or talk due to intensity of pain/unable to sleep and unable to use distraction. Severe range order  Average Pain 9  Right handed. Right hand pain and weakness. Knot on right wrist and hand, last 2 digits are have dropped in the last 2 weeks

## 2022-07-11 NOTE — Procedures (Signed)
EMG & NCV Findings: Evaluation of the right median motor nerve showed prolonged distal onset latency (5.1 ms), reduced amplitude (3.9 mV), and decreased conduction velocity (Elbow-Wrist, 47 m/s).  The left median (across palm) sensory and the right median (across palm) sensory nerves showed prolonged distal peak latency (Wrist, L4.9, R5.4 ms) and prolonged distal peak latency (Palm, L2.8, R2.9 ms).  All remaining nerves (as indicated in the following tables) were within normal limits.  Left vs. Right side comparison data for the median motor nerve indicates abnormal L-R latency difference (0.9 ms).    All examined muscles (as indicated in the following table) showed no evidence of electrical instability.    Impression: The above electrodiagnostic study is ABNORMAL and reveals evidence of:  a severe right median nerve entrapment at the wrist (carpal tunnel syndrome) affecting sensory and motor components.   a mild to moderate left median nerve entrapment at the wrist (carpal tunnel syndrome) affecting sensory components.   There is no significant electrodiagnostic evidence of any other focal nerve entrapment, brachial plexopathy or cervical radiculopathy.   Recommendations: 1.  Follow-up with referring physician. 2.  Continue current management of symptoms. 3.  Continue use of resting splint at night-time and as needed during the day. 4.  Suggest surgical evaluation.  ___________________________ Laurence Spates FAAPMR Board Certified, American Board of Physical Medicine and Rehabilitation    Nerve Conduction Studies Anti Sensory Summary Table   Stim Site NR Peak (ms) Norm Peak (ms) P-T Amp (V) Norm P-T Amp Site1 Site2 Delta-P (ms) Dist (cm) Vel (m/s) Norm Vel (m/s)  Left Median Acr Palm Anti Sensory (2nd Digit)  30.2C  Wrist    *4.9 <3.6 10.8 >10 Wrist Palm 2.1 0.0    Palm    *2.8 <2.0 20.3         Right Median Acr Palm Anti Sensory (2nd Digit)  30.8C  Wrist    *5.4 <3.6 27.3 >10  Wrist Palm 2.5 0.0    Palm    *2.9 <2.0 32.8         Right Radial Anti Sensory (Base 1st Digit)  29.1C  Wrist    2.4 <3.1 24.4  Wrist Base 1st Digit 2.4 0.0    Right Ulnar Anti Sensory (5th Digit)  30.1C  Wrist    3.7 <3.7 24.6 >15.0 Wrist 5th Digit 3.7 14.0 38 >38   Motor Summary Table   Stim Site NR Onset (ms) Norm Onset (ms) O-P Amp (mV) Norm O-P Amp Site1 Site2 Delta-0 (ms) Dist (cm) Vel (m/s) Norm Vel (m/s)  Left Median Motor (Abd Poll Brev)  29.9C  Wrist    4.2 <4.2 7.5 >5 Elbow Wrist 3.9 21.0 54 >50  Elbow    8.1  7.3         Right Median Motor (Abd Poll Brev)  29C  Wrist    *5.1 <4.2 *3.9 >5 Elbow Wrist 4.4 20.5 *47 >50  Elbow    9.5  4.2         Right Ulnar Motor (Abd Dig Min)  28.8C  Wrist    3.6 <4.2 8.9 >3 B Elbow Wrist 3.1 19.5 63 >53  B Elbow    6.7  8.7  A Elbow B Elbow 1.4 10.0 71 >53  A Elbow    8.1  8.0          EMG   Side Muscle Nerve Root Ins Act Fibs Psw Amp Dur Poly Recrt Int Fraser Din Comment  Right Abd Poll Brev Median  C8-T1 Nml Nml Nml Nml Nml 0 Nml Nml   Right 1stDorInt Ulnar C8-T1 Nml Nml Nml Nml Nml 0 Nml Nml   Right PronatorTeres Median C6-7 Nml Nml Nml Nml Nml 0 Nml Nml   Right Biceps Musculocut C5-6 Nml Nml Nml Nml Nml 0 Nml Nml   Right Deltoid Axillary C5-6 Nml Nml Nml Nml Nml 0 Nml Nml     Nerve Conduction Studies Anti Sensory Left/Right Comparison   Stim Site L Lat (ms) R Lat (ms) L-R Lat (ms) L Amp (V) R Amp (V) L-R Amp (%) Site1 Site2 L Vel (m/s) R Vel (m/s) L-R Vel (m/s)  Median Acr Palm Anti Sensory (2nd Digit)  30.2C  Wrist *4.9 *5.4 0.5 10.8 27.3 60.4 Wrist Palm     Palm *2.8 *2.9 0.1 20.3 32.8 38.1       Radial Anti Sensory (Base 1st Digit)  29.1C  Wrist  2.4   24.4  Wrist Base 1st Digit     Ulnar Anti Sensory (5th Digit)  30.1C  Wrist  3.7   24.6  Wrist 5th Digit  38    Motor Left/Right Comparison   Stim Site L Lat (ms) R Lat (ms) L-R Lat (ms) L Amp (mV) R Amp (mV) L-R Amp (%) Site1 Site2 L Vel (m/s) R Vel (m/s) L-R Vel (m/s)   Median Motor (Abd Poll Brev)  29.9C  Wrist 4.2 *5.1 *0.9 7.5 *3.9 48.0 Elbow Wrist 54 *47 7  Elbow 8.1 9.5 1.4 7.3 4.2 42.5       Ulnar Motor (Abd Dig Min)  28.8C  Wrist  3.6   8.9  B Elbow Wrist  63   B Elbow  6.7   8.7  A Elbow B Elbow  71   A Elbow  8.1   8.0           Waveforms:

## 2022-07-16 ENCOUNTER — Ambulatory Visit (INDEPENDENT_AMBULATORY_CARE_PROVIDER_SITE_OTHER): Payer: Medicare Other | Admitting: Orthopaedic Surgery

## 2022-07-16 ENCOUNTER — Encounter: Payer: Self-pay | Admitting: Orthopaedic Surgery

## 2022-07-16 VITALS — BP 110/68 | HR 76 | Ht 67.0 in | Wt 113.0 lb

## 2022-07-16 DIAGNOSIS — G5603 Carpal tunnel syndrome, bilateral upper limbs: Secondary | ICD-10-CM | POA: Diagnosis not present

## 2022-07-16 DIAGNOSIS — M67441 Ganglion, right hand: Secondary | ICD-10-CM

## 2022-07-16 NOTE — Progress Notes (Addendum)
Allison Dickerson - 67 y.o. female MRN [id number]  Date of birth: [birth year]  Office Visit Note: Visit Date: 07/09/2022 PCP: Lindell Spar, MD Referred by: Sanjuana Kava, MD  Subjective: Chief Complaint  Patient presents with   Right Wrist - Pain   Right Hand - Pain, Weakness   HPI: Allison Dickerson is a 67 y.o. female who comes in today at the request of Dr. Sanjuana Kava for evaluation and management of chronic, worsening and severe pain, numbness and tingling in the Bilateral upper extremities.  Patient is Right hand dominant.  She reports chronic pain numbness and tingling over the last year in both hands right more than left.  She has a history of what she refers to is nerve damage in the arm and hands because the amount of pain that she is having.  She has a history of cervical degenerative change.  No recent MRI.  Symptoms have been in both hands over the last year and according to Dr. Luna Glasgow she had been diagnosed with carpal tunnel syndrome by Dr. Phillips Odor.  Reviewing the records I see 3 notes from Dr. Merlene Laughter where he completed ultrasound-guided injections of the carpal tunnel in February and May and August 2022.  I do not see the report for the electrodiagnostic study although he noted in his report for the injection that he felt like she had severe carpal tunnel syndrome.  She has hemoglobin A1c's which are borderline elevated.  She is not insulin-dependent diabetic but carries diagnosis of prediabetes.  Not really endorsing frank radicular symptoms.  Interestingly over the last couple of weeks she reports the right to ulnar digits have "dropped".  She does hold her fingers and more of a flexed position at the MCP joint.  She does endorse some tingling numbness to a degree.  She has a pretty significant cyst on the dorsum of the hand which was drained by Dr. Johnnette Litter that has returned.      Review of Systems  Musculoskeletal:  Positive for joint pain and neck pain.   Neurological:  Positive for tingling and focal weakness.  All other systems reviewed and are negative.  Otherwise per HPI.  Assessment & Plan: Visit Diagnoses:    ICD-10-CM   1. Paresthesia of skin  R20.2 NCV with EMG (electromyography)    2. Ganglion cyst of tendon sheath of right hand  M67.441     3. Right hand weakness  R29.898     4. Pain in left hand  M79.642        Plan: Impression: Clinically she presents with a fairly interesting case of weakness in the right hand more of the ulnar digits but per the really tingling numbness more median nerve distribution.  She does have this large ganglion cyst that was drained but has returned.  She does have weakness of the ulnar digits but mainly with extension.  Prior carpal tunnel injections performed by Dr. Merlene Laughter did give some relief at the time of her symptoms.  Electrodiagnostic studies performed today.  The above electrodiagnostic study is ABNORMAL and reveals evidence of:  a severe right median nerve entrapment at the wrist (carpal tunnel syndrome) affecting sensory and motor components.   a mild to moderate left median nerve entrapment at the wrist (carpal tunnel syndrome) affecting sensory components.   There is no significant electrodiagnostic evidence of any other focal nerve entrapment, brachial plexopathy or cervical radiculopathy.   Specifically no ulnar nerve neuropathy noted.  Concern would be  for this cyst causing some mechanical issue with weakness in the tendons of the ulnar 2 digits.  Recommendations: 1.  Follow-up with referring physician.  Clinical correlation with the cyst and studies is paramount. 2.  Continue current management of symptoms. 3.  Continue use of resting splint at night-time and as needed during the day. 4.  Suggest surgical evaluation.  Meds & Orders: No orders of the defined types were placed in this encounter.   Orders Placed This Encounter  Procedures   NCV with EMG (electromyography)     Follow-up: Return for Sanjuana Kava, MD.   Procedures: No procedures performed  EMG & NCV Findings: Evaluation of the right median motor nerve showed prolonged distal onset latency (5.1 ms), reduced amplitude (3.9 mV), and decreased conduction velocity (Elbow-Wrist, 47 m/s).  The left median (across palm) sensory and the right median (across palm) sensory nerves showed prolonged distal peak latency (Wrist, L4.9, R5.4 ms) and prolonged distal peak latency (Palm, L2.8, R2.9 ms).  All remaining nerves (as indicated in the following tables) were within normal limits.  Left vs. Right side comparison data for the median motor nerve indicates abnormal L-R latency difference (0.9 ms).    All examined muscles (as indicated in the following table) showed no evidence of electrical instability.    Impression: The above electrodiagnostic study is ABNORMAL and reveals evidence of:  a severe right median nerve entrapment at the wrist (carpal tunnel syndrome) affecting sensory and motor components.   a mild to moderate left median nerve entrapment at the wrist (carpal tunnel syndrome) affecting sensory components.   There is no significant electrodiagnostic evidence of any other focal nerve entrapment, brachial plexopathy or cervical radiculopathy.   Recommendations: 1.  Follow-up with referring physician. 2.  Continue current management of symptoms. 3.  Continue use of resting splint at night-time and as needed during the day. 4.  Suggest surgical evaluation.  ___________________________ Laurence Spates FAAPMR Board Certified, American Board of Physical Medicine and Rehabilitation    Nerve Conduction Studies Anti Sensory Summary Table   Stim Site NR Peak (ms) Norm Peak (ms) P-T Amp (V) Norm P-T Amp Site1 Site2 Delta-P (ms) Dist (cm) Vel (m/s) Norm Vel (m/s)  Left Median Acr Palm Anti Sensory (2nd Digit)  30.2C  Wrist    *4.9 <3.6 10.8 >10 Wrist Palm 2.1 0.0    Palm    *2.8 <2.0 20.3          Right Median Acr Palm Anti Sensory (2nd Digit)  30.8C  Wrist    *5.4 <3.6 27.3 >10 Wrist Palm 2.5 0.0    Palm    *2.9 <2.0 32.8         Right Radial Anti Sensory (Base 1st Digit)  29.1C  Wrist    2.4 <3.1 24.4  Wrist Base 1st Digit 2.4 0.0    Right Ulnar Anti Sensory (5th Digit)  30.1C  Wrist    3.7 <3.7 24.6 >15.0 Wrist 5th Digit 3.7 14.0 38 >38   Motor Summary Table   Stim Site NR Onset (ms) Norm Onset (ms) O-P Amp (mV) Norm O-P Amp Site1 Site2 Delta-0 (ms) Dist (cm) Vel (m/s) Norm Vel (m/s)  Left Median Motor (Abd Poll Brev)  29.9C  Wrist    4.2 <4.2 7.5 >5 Elbow Wrist 3.9 21.0 54 >50  Elbow    8.1  7.3         Right Median Motor (Abd Poll Brev)  29C  Wrist    *5.1 <4.2 *  3.9 >5 Elbow Wrist 4.4 20.5 *47 >50  Elbow    9.5  4.2         Right Ulnar Motor (Abd Dig Min)  28.8C  Wrist    3.6 <4.2 8.9 >3 B Elbow Wrist 3.1 19.5 63 >53  B Elbow    6.7  8.7  A Elbow B Elbow 1.4 10.0 71 >53  A Elbow    8.1  8.0          EMG   Side Muscle Nerve Root Ins Act Fibs Psw Amp Dur Poly Recrt Int Fraser Din Comment  Right Abd Poll Brev Median C8-T1 Nml Nml Nml Nml Nml 0 Nml Nml   Right 1stDorInt Ulnar C8-T1 Nml Nml Nml Nml Nml 0 Nml Nml   Right PronatorTeres Median C6-7 Nml Nml Nml Nml Nml 0 Nml Nml   Right Biceps Musculocut C5-6 Nml Nml Nml Nml Nml 0 Nml Nml   Right Deltoid Axillary C5-6 Nml Nml Nml Nml Nml 0 Nml Nml     Nerve Conduction Studies Anti Sensory Left/Right Comparison   Stim Site L Lat (ms) R Lat (ms) L-R Lat (ms) L Amp (V) R Amp (V) L-R Amp (%) Site1 Site2 L Vel (m/s) R Vel (m/s) L-R Vel (m/s)  Median Acr Palm Anti Sensory (2nd Digit)  30.2C  Wrist *4.9 *5.4 0.5 10.8 27.3 60.4 Wrist Palm     Palm *2.8 *2.9 0.1 20.3 32.8 38.1       Radial Anti Sensory (Base 1st Digit)  29.1C  Wrist  2.4   24.4  Wrist Base 1st Digit     Ulnar Anti Sensory (5th Digit)  30.1C  Wrist  3.7   24.6  Wrist 5th Digit  38    Motor Left/Right Comparison   Stim Site L Lat (ms) R Lat (ms) L-R Lat  (ms) L Amp (mV) R Amp (mV) L-R Amp (%) Site1 Site2 L Vel (m/s) R Vel (m/s) L-R Vel (m/s)  Median Motor (Abd Poll Brev)  29.9C  Wrist 4.2 *5.1 *0.9 7.5 *3.9 48.0 Elbow Wrist 54 *47 7  Elbow 8.1 9.5 1.4 7.3 4.2 42.5       Ulnar Motor (Abd Dig Min)  28.8C  Wrist  3.6   8.9  B Elbow Wrist  63   B Elbow  6.7   8.7  A Elbow B Elbow  71   A Elbow  8.1   8.0           Waveforms:                 Clinical History: No specialty comments available.   She reports that she has quit smoking. Her smoking use included cigarettes. She has a 10.00 pack-year smoking history. She has never used smokeless tobacco.  Recent Labs    02/13/22 1121  HGBA1C 5.8*    Objective:  VS:  HT:    WT:   BMI:     BP:   HR: bpm  TEMP: ( )  RESP:  Physical Exam Vitals and nursing note reviewed.  Constitutional:      General: She is not in acute distress.    Appearance: Normal appearance. She is well-developed. She is not ill-appearing.  HENT:     Head: Normocephalic and atraumatic.  Eyes:     Conjunctiva/sclera: Conjunctivae normal.     Pupils: Pupils are equal, round, and reactive to light.  Cardiovascular:     Rate and Rhythm: Normal rate.  Pulses: Normal pulses.  Pulmonary:     Effort: Pulmonary effort is normal.  Musculoskeletal:        General: Swelling, tenderness and deformity present.     Right lower leg: No edema.     Left lower leg: No edema.     Comments: Inspection reveals significant dorsal ganglion cyst on the right hand and wrist.  She has weakness of the right fourth and fifth digit with extension at the MCP joint.  She has a negative Wartenberg and negative Benedictine sign bilaterally.  No atrophy of the bilateral APB or FDI or hand intrinsics.  There is flattening of the right more than left APB.  There is no swelling, color changes, allodynia or dystrophic changes. There is 5 out of 5 strength in the bilateral wrist extension and long finger flexion. There is intact sensation  to light touch in the right median nerve peripheral nerve distributions. There is a negative Hoffmann's test bilaterally.  Skin:    General: Skin is warm and dry.     Findings: No erythema or rash.  Neurological:     General: No focal deficit present.     Mental Status: She is alert and oriented to person, place, and time.     Sensory: No sensory deficit.     Motor: No weakness or abnormal muscle tone.     Coordination: Coordination normal.     Gait: Gait normal.  Psychiatric:        Mood and Affect: Mood normal.        Behavior: Behavior normal.     Ortho Exam  Imaging: No results found.  Past Medical/Family/Surgical/Social History: Medications & Allergies reviewed per EMR, new medications updated. Patient Active Problem List   Diagnosis Date Noted   Chronic prescription benzodiazepine use 07/24/2021   Benzodiazepine dependence ([place]) 05/29/2021   Urethral caruncle 53/97/6734   Lichen sclerosus 19/37/9024   Vaginal atrophy 11/29/2020   IBS (irritable bowel syndrome) 10/05/2020   Prediabetes 09/25/2020   Vitamin D deficiency 09/25/2020   Mixed hyperlipidemia 09/25/2020   Pulmonary nodule 06/28/2020   Bilateral carpal tunnel syndrome 06/27/2020   Lesion of ulnar nerve 06/27/2020   Polyarthropathy 06/27/2020   GERD (gastroesophageal reflux disease) 06/27/2020   OAB (overactive bladder) 05/08/2020   GAD (generalized anxiety disorder) 02/18/2017   Past Medical History:  Diagnosis Date   Anxiety    Arthritis    Phreesia 06/24/2020   Cervical cancer ([place])    When younger.   Diverticulitis    Pinched nerve    right hand   Family History  Problem Relation Age of Onset   Cancer Father    Alcohol abuse Sister    Cancer Other    Past Surgical History:  Procedure Laterality Date   ABDOMINAL HYSTERECTOMY     BIOPSY  10/24/2020   Procedure: BIOPSY;  Surgeon: Harvel Quale, MD;  [facility];  Service: Gastroenterology;;   cervical cancer removal      COLONOSCOPY WITH PROPOFOL N/A 10/24/2020   Procedure: COLONOSCOPY WITH PROPOFOL;  Surgeon: Harvel Quale, MD;  [facility];  Service: Gastroenterology;  Laterality: N/A;  9:00 am   ESOPHAGOGASTRODUODENOSCOPY (EGD) WITH PROPOFOL N/A 10/24/2020   Procedure: ESOPHAGOGASTRODUODENOSCOPY (EGD) WITH PROPOFOL;  Surgeon: Harvel Quale, MD;  [facility];  Service: Gastroenterology;  Laterality: N/A;   EYE SURGERY N/A    Phreesia 06/24/2020   POLYPECTOMY  10/24/2020   Procedure: POLYPECTOMY;  Surgeon: Harvel Quale, MD;  [facility];  Service: Gastroenterology;;   Social History   Occupational History   Occupation: retired  Tobacco Use   Smoking status: Former    Packs/day: 0.50    Years: 20.00    Total pack years: 10.00    Types: Cigarettes   Smokeless tobacco: Never  Vaping Use   Vaping Use: Never used  Substance and Sexual Activity   Alcohol use: No   Drug use: Yes    Types: Marijuana    Comment: occ with gummies   Sexual activity: Not Currently    Birth control/protection: Surgical    Comment: hyst

## 2022-07-16 NOTE — Progress Notes (Signed)
I had the EMG  Her EMG showed severe carpal tunnel right and mild left.  I have explained findings to her.  She has cystic swelling over the fourth compartment of the dorsal wrist with inability to extend the ring and little fingers.  She wants me to aspirate the cyst.  Procedure note: After permission from the patient and prep of the right dorsal hand, I aspirated about 2 to 2/12 cc of fluid from the dorsal cyst over the fourth compartment of the hand by sterile technique tolerated well.  She has decreased sensation of median nerve of both hands.  The right hand she lacks extension of the little and ring fingers.  Sensation intact.  Encounter Diagnoses  Name Primary?   Ganglion cyst of tendon sheath of right hand Yes   Bilateral carpal tunnel syndrome    I will have hand surgeon see her.  Call if any problem.  Precautions discussed.  Electronically Signed Sanjuana Kava, MD 1/30/20243:24 PM

## 2022-07-16 NOTE — Patient Instructions (Signed)
You have been referred to see a hand specialist, Dr.Kevin Kuzma. We will send your referral to [facility]. If you have not heard from them in one week, call them to schedule your appointment.   Dr. Fredna Dow [facility] [address] [place], [place] [address] [facility]: [phone]

## 2022-07-25 ENCOUNTER — Ambulatory Visit (INDEPENDENT_AMBULATORY_CARE_PROVIDER_SITE_OTHER): Payer: Medicare Other | Admitting: Internal Medicine

## 2022-07-25 ENCOUNTER — Encounter: Payer: Self-pay | Admitting: Internal Medicine

## 2022-07-25 VITALS — BP 115/73 | HR 88 | Ht 67.0 in | Wt 113.8 lb

## 2022-07-25 DIAGNOSIS — F411 Generalized anxiety disorder: Secondary | ICD-10-CM

## 2022-07-25 DIAGNOSIS — G5603 Carpal tunnel syndrome, bilateral upper limbs: Secondary | ICD-10-CM | POA: Diagnosis not present

## 2022-07-25 DIAGNOSIS — M67431 Ganglion, right wrist: Secondary | ICD-10-CM | POA: Insufficient documentation

## 2022-07-25 DIAGNOSIS — E782 Mixed hyperlipidemia: Secondary | ICD-10-CM | POA: Diagnosis not present

## 2022-07-25 DIAGNOSIS — R7303 Prediabetes: Secondary | ICD-10-CM | POA: Diagnosis not present

## 2022-07-25 DIAGNOSIS — E559 Vitamin D deficiency, unspecified: Secondary | ICD-10-CM

## 2022-07-25 MED ORDER — LORAZEPAM 1 MG PO TABS: 1.0000 mg | ORAL_TABLET | Freq: Three times a day (TID) | ORAL | 2 refills | Status: DC | PRN

## 2022-07-25 NOTE — Assessment & Plan Note (Signed)
Lab Results  Component Value Date   HGBA1C 5.8 (H) 02/13/2022   Advised to follow low carb diet DASH diet material provided

## 2022-07-25 NOTE — Assessment & Plan Note (Signed)
Recurrent Also has pain in wrist and forearm Planned to see Hand surgeon - Dr Fredna Dow

## 2022-07-25 NOTE — Progress Notes (Signed)
Established Patient Office Visit  Subjective:  Patient ID: Allison Dickerson, female    DOB: 09/22/1955  Age: 67 y.o. MRN: [id number]  CC:  Chief Complaint  Patient presents with   Anxiety    Three month follow up for GAD. Bump raised up on right hand, patient is seeing hand specialist on the 20th. She Is also saying her diet is having to change due to her false teeth     HPI Allison Dickerson is a 67 y.o. female with past medical history of GERD, polyarthritis, severe anxiety and pulmonary nodule who presents for f/u of her chronic medical conditions.  GAD: She takes Ativan 1 mg 3 times daily for anxiety.  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  She is stressed currently due to her chronic right hand pain.  She recently had dental work-up done for dentures as well.  She has chronic right hand pain.  She has a ganglion cyst on the right hand, for which she is going to see and specialist.  She also has bilateral carpal tunnel syndrome.  She wants to talk about her preventive tests in the next visit.   Past Medical History:  Diagnosis Date   Anxiety    Arthritis    Phreesia 06/24/2020   Cervical cancer ([place])    When younger.   Diverticulitis    Pinched nerve    right hand    Past Surgical History:  Procedure Laterality Date   ABDOMINAL HYSTERECTOMY     BIOPSY  10/24/2020   Procedure: BIOPSY;  Surgeon: Montez Morita, Quillian Quince, MD;  [facility];  Service: Gastroenterology;;   cervical cancer removal     COLONOSCOPY WITH PROPOFOL N/A 10/24/2020   Procedure: COLONOSCOPY WITH PROPOFOL;  Surgeon: Harvel Quale, MD;  [facility];  Service: Gastroenterology;  Laterality: N/A;  9:00 am   ESOPHAGOGASTRODUODENOSCOPY (EGD) WITH PROPOFOL N/A 10/24/2020   Procedure: ESOPHAGOGASTRODUODENOSCOPY (EGD) WITH PROPOFOL;  Surgeon: Harvel Quale, MD;  [facility];  Service: Gastroenterology;  Laterality: N/A;   EYE SURGERY N/A    Phreesia 06/24/2020   POLYPECTOMY  10/24/2020   Procedure: POLYPECTOMY;  Surgeon: Montez Morita, Quillian Quince, MD;  [facility];  Service: Gastroenterology;;    Family History  Problem Relation Age of Onset   Cancer Father    Alcohol abuse Sister    Cancer Other     Social History   Socioeconomic History   Marital status: Married    Spouse name: Not on file   Number of children: Not on file   Years of education: Not on file   Highest education level: Not on file  Occupational History   Occupation: retired  Tobacco Use   Smoking status: Former    Packs/day: 0.50    Years: 20.00    Total pack years: 10.00    Types: Cigarettes   Smokeless tobacco: Never  Vaping Use   Vaping Use: Never used  Substance and Sexual Activity   Alcohol use: No   Drug use: Yes    Types: Marijuana    Comment: occ with gummies   Sexual activity: Not Currently    Birth control/protection: Surgical    Comment: hyst  Other Topics Concern   Not on file  Social History Narrative   Not on file   Social Determinants of Health   Financial Resource Strain: Low Risk  (  04/17/2021)   Overall Financial Resource Strain (CARDIA)    Difficulty of Paying Living Expenses: Not hard at all  Food Insecurity: No Food Insecurity (04/17/2021)   Hunger Vital Sign    Worried About Running Out of Food in the Last Year: Never true    Ran Out of Food in the Last Year: Never true  Transportation Needs: No Transportation Needs (04/17/2021)   PRAPARE - Hydrologist (Medical): No    Lack of Transportation (Non-Medical): No  Physical Activity: Inactive (04/17/2021)   Exercise Vital Sign    Days of Exercise per Week: 0 days    Minutes of Exercise per Session: 0 min  Stress: Stress Concern Present (04/17/2021)   [place]    Feeling of Stress :  To some extent  Social Connections: Moderately Isolated (04/17/2021)   Social Connection and Isolation Panel [NHANES]    Frequency of Communication with Friends and Family: More than three times a week    Frequency of Social Gatherings with Friends and Family: More than three times a week    Attends Religious Services: Never    Marine scientist or Organizations: No    Attends Archivist Meetings: Never    Marital Status: Married  Human resources officer Violence: Not At Risk (04/17/2021)   Humiliation, Afraid, Rape, and Kick questionnaire    Fear of Current or Ex-Partner: No    Emotionally Abused: No    Physically Abused: No    Sexually Abused: No    Outpatient Medications Prior to Visit  Medication Sig Dispense Refill   ibuprofen (ADVIL) 800 MG tablet Take 800 mg by mouth.     Multiple Vitamin (MULTIVITAMIN WITH MINERALS) TABS tablet Take 1 tablet by mouth daily.     LORazepam (ATIVAN) 1 MG tablet TAKE 1 TABLETS(1 MG) BY MOUTH EVERY 8 HOURS AS NEEDED FOR ANXIETY 90 tablet 2   No facility-administered medications prior to visit.    No Known Allergies  ROS Review of Systems  Constitutional:  Negative for chills and fever.  HENT:  Negative for congestion, sinus pressure, sinus pain and sore throat.   Eyes:  Negative for pain and discharge.  Respiratory:  Negative for cough and shortness of breath.   Cardiovascular:  Negative for chest pain and palpitations.  Gastrointestinal:  Negative for abdominal pain, constipation, nausea and vomiting.  Endocrine: Negative for polydipsia and polyuria.  Genitourinary:  Negative for dysuria and hematuria.  Musculoskeletal:  Positive for arthralgias (Right wrist). Negative for neck pain and neck stiffness.  Skin:  Negative for rash.  Neurological:  Positive for weakness and numbness (Right hand). Negative for dizziness and speech difficulty.  Psychiatric/Behavioral:  Positive for sleep disturbance. Negative for agitation, behavioral  problems, dysphoric mood and suicidal ideas. The patient is nervous/anxious.       Objective:    Physical Exam Vitals reviewed.  Constitutional:      General: She is not in acute distress.    Appearance: She is not diaphoretic.  HENT:     Head: Normocephalic and atraumatic.     Nose: Nose normal.     Mouth/Throat:     Mouth: Mucous membranes are moist.  Eyes:     General: No scleral icterus.    Extraocular Movements: Extraocular movements intact.  Cardiovascular:     Rate and Rhythm: Normal rate and regular rhythm.     Pulses: Normal pulses.     Heart  sounds: Normal heart sounds. No murmur heard. Pulmonary:     Breath sounds: Normal breath sounds. No wheezing or rales.  Musculoskeletal:     Cervical back: Neck supple. No tenderness.     Right lower leg: No edema.     Left lower leg: No edema.     Comments: Cyst - about 2 cm in diameter over dorsum of right wrist Right wrist brace in place  Skin:    General: Skin is warm.     Findings: No rash.  Neurological:     General: No focal deficit present.     Mental Status: She is alert and oriented to person, place, and time.     Sensory: No sensory deficit.     Motor: No weakness.  Psychiatric:        Mood and Affect: Mood normal.        Behavior: Behavior normal.     BP 115/73 (BP Location: Left Arm, Patient Position: Sitting, Cuff Size: Normal)   Pulse 88   Ht '5\' 7"'$  (1.702 m)   Wt 113 lb 12.8 oz (51.6 kg)   SpO2 98%   BMI 17.82 kg/m  Wt Readings from Last 3 Encounters:  07/25/22 113 lb 12.8 oz (51.6 kg)  07/16/22 113 lb (51.3 kg)  06/25/22 113 lb (51.3 kg)    Lab Results  Component Value Date   TSH 1.470 02/13/2022   Lab Results  Component Value Date   WBC 8.7 02/13/2022   HGB 13.3 02/13/2022   HCT 39.8 02/13/2022   MCV 90 02/13/2022   PLT 287 02/13/2022   Lab Results  Component Value Date   NA 143 02/13/2022   K 4.6 02/13/2022   CO2 20 02/13/2022   GLUCOSE 101 (H) 02/13/2022   BUN 19 02/13/2022    CREATININE 0.94 02/13/2022   BILITOT 0.5 02/13/2022   ALKPHOS 85 02/13/2022   AST 22 02/13/2022   ALT 19 02/13/2022   PROT 7.1 02/13/2022   ALBUMIN 4.5 02/13/2022   CALCIUM 10.2 02/13/2022   ANIONGAP 8 02/06/2021   EGFR 67 02/13/2022   Lab Results  Component Value Date   CHOL 225 (H) 02/13/2022   Lab Results  Component Value Date   HDL 76 02/13/2022   Lab Results  Component Value Date   LDLCALC 136 (H) 02/13/2022   Lab Results  Component Value Date   TRIG 73 02/13/2022   Lab Results  Component Value Date   CHOLHDL 3.0 02/13/2022   Lab Results  Component Value Date   HGBA1C 5.8 (H) 02/13/2022      Assessment & Plan:   Problem List Items Addressed This Visit       Nervous and Auditory   Bilateral carpal tunnel syndrome    Followed by Orthopedic surgeon Reviewed nerve conduction study and PM&R chart Planned to see Hand specialist Gets steroid injections Wrist splint advised      Relevant Medications   LORazepam (ATIVAN) 1 MG tablet   Other Relevant Orders   CMP14+EGFR   CBC with Differential/Platelet     Other   GAD (generalized anxiety disorder) - Primary    Well-controlled Had caregiver stress as well, has been stressed due to loss of her mother recently and her right hand pain On Lorazepam 1 mg TID, has been on Benzodiazepine for more than 15 years Did not tolerate Wellbutrin in the past with her previous PCP Did not like Buspar      Relevant Medications   LORazepam (ATIVAN)  1 MG tablet   Other Relevant Orders   TSH   CMP14+EGFR   CBC with Differential/Platelet   Prediabetes    Lab Results  Component Value Date   HGBA1C 5.8 (H) 02/13/2022  Advised to follow low carb diet DASH diet material provided      Relevant Orders   Hemoglobin A1c   CMP14+EGFR   Vitamin D deficiency   Relevant Orders   VITAMIN D 25 Hydroxy (Vit-D Deficiency, Fractures)   Mixed hyperlipidemia    Lipid profile reviewed Advised to follow low cholesterol diet  for now      Relevant Orders   Lipid panel   Ganglion cyst of dorsum of right wrist    Recurrent Also has pain in wrist and forearm Planned to see Hand surgeon - Dr Fredna Dow      Meds ordered this encounter  Medications   LORazepam (ATIVAN) 1 MG tablet    Sig: Take 1 tablet (1 mg total) by mouth every 8 (eight) hours as needed for anxiety.    Dispense:  90 tablet    Refill:  2    Follow-up: Return in about 3 months (around 10/23/2022) for GAD.    Lindell Spar, MD

## 2022-07-25 NOTE — Assessment & Plan Note (Signed)
Lipid profile reviewed Advised to follow low cholesterol diet for now

## 2022-07-25 NOTE — Assessment & Plan Note (Signed)
Well-controlled Had caregiver stress as well, has been stressed due to loss of her mother recently and her right hand pain On Lorazepam 1 mg TID, has been on Benzodiazepine for more than 15 years Did not tolerate Wellbutrin in the past with her previous PCP Did not like Buspar

## 2022-07-25 NOTE — Assessment & Plan Note (Signed)
Followed by Orthopedic surgeon Reviewed nerve conduction study and PM&R chart Planned to see Hand specialist Gets steroid injections Wrist splint advised

## 2022-07-25 NOTE — Patient Instructions (Addendum)
Please continue taking medications as prescribed.  Please continue to follow low cholesterol diet and perform moderate exercise/walking at least 150 mins/week.  Please get fasting blood tests done before the next visit.

## 2022-08-12 ENCOUNTER — Other Ambulatory Visit: Payer: Self-pay

## 2022-08-12 ENCOUNTER — Encounter (HOSPITAL_COMMUNITY): Payer: Self-pay | Admitting: *Deleted

## 2022-08-12 ENCOUNTER — Emergency Department (HOSPITAL_COMMUNITY)
Admission: EM | Admit: 2022-08-12 | Discharge: 2022-08-12 | Disposition: A | Discharge status: Home / Self Care | Payer: Medicare Other | Attending: Emergency Medicine | Admitting: Emergency Medicine

## 2022-08-12 DIAGNOSIS — Z8541 Personal history of malignant neoplasm of cervix uteri: Secondary | ICD-10-CM | POA: Insufficient documentation

## 2022-08-12 DIAGNOSIS — R42 Dizziness and giddiness: Secondary | ICD-10-CM | POA: Insufficient documentation

## 2022-08-12 DIAGNOSIS — R112 Nausea with vomiting, unspecified: Secondary | ICD-10-CM | POA: Insufficient documentation

## 2022-08-12 LAB — URINALYSIS, ROUTINE W REFLEX MICROSCOPIC
Bacteria, UA: NONE SEEN
Bilirubin Urine: NEGATIVE
Glucose, UA: NEGATIVE mg/dL
Ketones, ur: 5 mg/dL — AB
Leukocytes,Ua: NEGATIVE
Nitrite: NEGATIVE
Protein, ur: NEGATIVE mg/dL
Specific Gravity, Urine: 1.006 (ref 1.005–1.030)
pH: 6 (ref 5.0–8.0)

## 2022-08-12 LAB — CBC
HCT: 37.6 % (ref 36.0–46.0)
Hemoglobin: 12.8 g/dL (ref 12.0–15.0)
MCH: 31.2 pg (ref 26.0–34.0)
MCHC: 34 g/dL (ref 30.0–36.0)
MCV: 91.7 fL (ref 80.0–100.0)
Platelets: 232 10*3/uL (ref 150–400)
RBC: 4.1 MIL/uL (ref 3.87–5.11)
RDW: 12.1 % (ref 11.5–15.5)
WBC: 6 10*3/uL (ref 4.0–10.5)
nRBC: 0 % (ref 0.0–0.2)

## 2022-08-12 LAB — BASIC METABOLIC PANEL
Anion gap: 5 (ref 5–15)
BUN: 20 mg/dL (ref 8–23)
CO2: 26 mmol/L (ref 22–32)
Calcium: 9.1 mg/dL (ref 8.9–10.3)
Chloride: 104 mmol/L (ref 98–111)
Creatinine, Ser: 0.89 mg/dL (ref 0.44–1.00)
GFR, Estimated: >60 mL/min (ref >60)
Glucose, Bld: 108 mg/dL — ABNORMAL HIGH (ref 70–99)
Potassium: 3.8 mmol/L (ref 3.5–5.1)
Sodium: 135 mmol/L (ref 135–145)

## 2022-08-12 MED ORDER — FLUCONAZOLE 200 MG PO TABS: 200.0000 mg | ORAL_TABLET | Freq: Every day | ORAL | 0 refills | Status: AC

## 2022-08-12 MED ORDER — MECLIZINE HCL 12.5 MG PO TABS: 12.5000 mg | ORAL_TABLET | Freq: Three times a day (TID) | ORAL | 0 refills | Status: DC | PRN

## 2022-08-12 MED ORDER — ONDANSETRON 4 MG PO TBDP
4.0000 mg | ORAL_TABLET | Freq: Once | ORAL | Status: AC
  Administered 2022-08-12: 4 mg via ORAL
  Filled 2022-08-12: qty 1

## 2022-08-12 MED ORDER — MECLIZINE HCL 12.5 MG PO TABS
25.0000 mg | ORAL_TABLET | Freq: Once | ORAL | Status: AC
  Administered 2022-08-12: 25 mg via ORAL
  Filled 2022-08-12: qty 2

## 2022-08-12 MED ORDER — ONDANSETRON 4 MG PO TBDP: 4.0000 mg | ORAL_TABLET | Freq: Three times a day (TID) | ORAL | 0 refills | Status: DC | PRN

## 2022-08-12 NOTE — Discharge Instructions (Addendum)
Please take your medications as prescribed. I recommend close follow-up with PCP for reevaluation.  Please do not hesitate to return to emergency department if worrisome signs symptoms we discussed become apparent.

## 2022-08-12 NOTE — ED Provider Notes (Signed)
[facility] Provider Note   CSN: [id number] Arrival date & time: 08/12/22  1805     History  Chief Complaint  Patient presents with   Dizziness    Allison Dickerson is a 67 y.o. female past medical history of arthritis, diverticulitis presenting today for evaluation of dizziness.  Patient reports she started to have dizziness 4 days ago and it has gotten worse.  She describes it as a room spinning sensation with associated nausea and vomiting.  Patient also reports sinus pain after having her teeth pulled out a couple months ago.   Dizziness   Past Medical History:  Diagnosis Date   Anxiety    Arthritis    Phreesia 06/24/2020   Cervical cancer ([place])    When younger.   Diverticulitis    Pinched nerve    right hand   Past Surgical History:  Procedure Laterality Date   ABDOMINAL HYSTERECTOMY     BIOPSY  10/24/2020   Procedure: BIOPSY;  Surgeon: Montez Morita, Quillian Quince, MD;  [facility];  Service: Gastroenterology;;   cervical cancer removal     COLONOSCOPY WITH PROPOFOL N/A 10/24/2020   Procedure: COLONOSCOPY WITH PROPOFOL;  Surgeon: Harvel Quale, MD;  [facility];  Service: Gastroenterology;  Laterality: N/A;  9:00 am   ESOPHAGOGASTRODUODENOSCOPY (EGD) WITH PROPOFOL N/A 10/24/2020   Procedure: ESOPHAGOGASTRODUODENOSCOPY (EGD) WITH PROPOFOL;  Surgeon: Harvel Quale, MD;  [facility];  Service: Gastroenterology;  Laterality: N/A;   EYE SURGERY N/A    Phreesia 06/24/2020   POLYPECTOMY  10/24/2020   Procedure: POLYPECTOMY;  Surgeon: Harvel Quale, MD;  [facility];  Service: Gastroenterology;;     Home Medications Prior to Admission medications   Medication Sig Start Date End Date Taking? Authorizing Provider  fluconazole (DIFLUCAN) 200 MG tablet Take 1 tablet (200 mg total) by mouth daily for 7 days. 08/12/22 08/19/22 Yes Rex Kras, PA  meclizine  (ANTIVERT) 12.5 MG tablet Take 1 tablet (12.5 mg total) by mouth 3 (three) times daily as needed for dizziness. 08/12/22  Yes Rex Kras, PA  ibuprofen (ADVIL) 800 MG tablet Take 800 mg by mouth. 04/10/22   [provider]  LORazepam (ATIVAN) 1 MG tablet Take 1 tablet (1 mg total) by mouth every 8 (eight) hours as needed for anxiety. 07/25/22   Lindell Spar, MD  Multiple Vitamin (MULTIVITAMIN WITH MINERALS) TABS tablet Take 1 tablet by mouth daily.    [provider]      Allergies    Patient has no known allergies.    Review of Systems   Review of Systems  Neurological:  Positive for dizziness.    Physical Exam Updated Vital Signs BP 127/86   Pulse 68   Temp 98.6 F (37 C)   Resp 11   SpO2 100%  Physical Exam Vitals and nursing note reviewed.  Constitutional:      Appearance: Normal appearance.  HENT:     Head: Normocephalic and atraumatic.     Mouth/Throat:     Mouth: Mucous membranes are moist.  Eyes:     General: No scleral icterus. Cardiovascular:     Rate and Rhythm: Normal rate and regular rhythm.     Pulses: Normal pulses.     Heart sounds: Normal heart sounds.  Pulmonary:     Effort: Pulmonary effort is normal.     Breath sounds: Normal breath sounds.  Abdominal:  General: Abdomen is flat.     Palpations: Abdomen is soft.     Tenderness: There is no abdominal tenderness.  Musculoskeletal:        General: No deformity.  Skin:    General: Skin is warm.     Findings: No rash.  Neurological:     General: No focal deficit present.     Mental Status: She is alert.     Comments: Cranial nerves II through XII intact. Intact sensation to light touch in all 4 extremities. 5/5 strength in all 4 extremities. Intact finger-to-nose and heel-to-shin of all 4 extremities. No visual field cuts. No neglect noted. No aphasia noted.   Psychiatric:        Mood and Affect: Mood normal.     ED Results / Procedures / Treatments   Labs (all labs ordered  are listed, but only abnormal results are displayed) Labs Reviewed  URINALYSIS, ROUTINE W REFLEX MICROSCOPIC - Abnormal; Notable for the following components:      Result Value   Color, Urine STRAW (*)    Hgb urine dipstick SMALL (*)    Ketones, ur 5 (*)    All other components within normal limits  BASIC METABOLIC PANEL - Abnormal; Notable for the following components:   Glucose, Bld 108 (*)    All other components within normal limits  CBC    EKG EKG Interpretation  Date/Time:  Monday August 12 2022 19:55:57 EST Ventricular Rate:  59 PR Interval:  196 QRS Duration: 88 QT Interval:  396 QTC Calculation: 393 R Axis:   74 Text Interpretation: Sinus rhythm Normal ECG Confirmed by Noemi Chapel [phone]) on 08/12/2022 8:05:56 PM  Radiology No results found.  Procedures Procedures    Medications Ordered in ED Medications  meclizine (ANTIVERT) tablet 25 mg (25 mg Oral Given 08/12/22 2014)  ondansetron (ZOFRAN-ODT) disintegrating tablet 4 mg (4 mg Oral Given 08/12/22 2013)    ED Course/ Medical Decision Making/ A&P                             Medical Decision Making Amount and/or Complexity of Data Reviewed Labs: ordered.  Risk Prescription drug management.   This patient presents to the ED for dizziness, this involves an extensive number of treatment options, and is a complaint that carries with a high risk of complications and morbidity.  The differential diagnosis includes CVA, ICH, multiple sclerosis, acoustic neuroma, carotid artery dissection, BPPV, labyrinthitis, Mnire's.  This is not an exhaustive list.  Lab tests: I ordered and personally interpreted labs.  The pertinent results include: WBC unremarkable. Hbg unremarkable. Platelets unremarkable. Electrolytes unremarkable. BUN, creatinine unremarkable. UA significant for no acute abnormality.   Imaging studies: I ordered imaging studies. I personally reviewed, interpreted imaging and agree with the  radiologist's interpretations. The results include:    Problem list/ ED course/ Critical interventions/ Medical management: HPI: See above Vital signs within normal range and stable throughout visit. Laboratory/imaging studies significant for: See above. On physical examination, patient is afebrile and appears in no acute distress. This patient presents with dizziness, most consistent with a peripheral cause, likely BPPV. No history of recent infection so doubt vestibular neuritis. History not consistent with meniere's disease. No history of trauma. No red flag features for central vertigo to include gradual onset, vertical/bidirectional or non-fatigable nystagmus, focal neurologic findings on exam (including inability to ambulate, ataxia, dysmetria). Presentation not consistent with an acute CNS infection, vertebral basilar  artery insufficiency, cerebellar hemorrhage or infarction, intracranial mass or bleed. I will send an Rx of Antivert for symptom relief.  Advised patient to follow-up with primary care physician or neurology for further evaluation and management.  Return to the ER if new or worsening symptoms. I have reviewed the patient home medicines and have made adjustments as needed.  Cardiac monitoring/EKG: The patient was maintained on a cardiac monitor.  I personally reviewed and interpreted the cardiac monitor which showed an underlying rhythm of: sinus rhythm.  Additional history obtained: External records from outside source obtained and reviewed including: Chart review including previous notes, labs, imaging.  Disposition Continued outpatient therapy. Follow-up with PCP recommended for reevaluation of symptoms. Treatment plan discussed with patient.  Pt acknowledged understanding was agreeable to the plan. Worrisome signs and symptoms were discussed with patient, and patient acknowledged understanding to return to the ED if they noticed these signs and symptoms. Patient was stable  upon discharge.   This chart was dictated using voice recognition software.  Despite best efforts to proofread,  errors can occur which can change the documentation meaning.          Final Clinical Impression(s) / ED Diagnoses Final diagnoses:  Dizziness    Rx / DC Orders ED Discharge Orders          Ordered    fluconazole (DIFLUCAN) 200 MG tablet  Daily        08/12/22 2211    meclizine (ANTIVERT) 12.5 MG tablet  3 times daily PRN        08/12/22 2211              Rex Kras, PA 08/12/22 2327    Noemi Chapel, MD 08/12/22 [phone]

## 2022-08-12 NOTE — ED Triage Notes (Addendum)
Pt is here for dizziness which feels like her room is spinning and this is worse with movement.  Pt has had this before and it resolved on its own.  Pt has no new focal weakness with this.  Pt is tearful as she states that this scared her.  This began when she woke up the night before last.  Pt also feels like she might have a UTI

## 2022-08-15 ENCOUNTER — Other Ambulatory Visit: Payer: Self-pay | Admitting: Internal Medicine

## 2022-08-15 ENCOUNTER — Encounter: Payer: Self-pay | Admitting: Radiology

## 2022-08-15 DIAGNOSIS — Z1231 Encounter for screening mammogram for malignant neoplasm of breast: Secondary | ICD-10-CM

## 2022-08-20 ENCOUNTER — Telehealth: Payer: Self-pay

## 2022-08-20 NOTE — Telephone Encounter (Signed)
     Patient  visit on 2/26  at [facility] you been able to follow up with your primary care physician? NO  The patient was or was not able to obtain any needed medicine or equipment. YES   Are there diet recommendations that you are having difficulty following? NA   Patient expresses understanding of discharge instructions and education provided has no other needs at this time.  [facility], [place] [phone] [address], [place], [place] [address] Phone: [phone] Email: [email or web address]

## 2022-08-28 ENCOUNTER — Other Ambulatory Visit: Payer: Self-pay

## 2022-09-02 ENCOUNTER — Ambulatory Visit (INDEPENDENT_AMBULATORY_CARE_PROVIDER_SITE_OTHER): Payer: Medicare Other

## 2022-09-02 DIAGNOSIS — Z0000 Encounter for general adult medical examination without abnormal findings: Secondary | ICD-10-CM | POA: Diagnosis not present

## 2022-09-02 NOTE — Patient Instructions (Signed)
  Ms. Juniper , Thank you for taking time to come for your Medicare Wellness Visit. I appreciate your ongoing commitment to your health goals. Please review the following plan we discussed and let me know if I can assist you in the future.   These are the goals we discussed:  Goals      Patient Stated     Take more time for herself and take better care of herself health wise.     Patient Stated     Get well        This is a list of the screening recommended for you and due dates:  Health Maintenance  Topic Date Due   COVID-19 Vaccine (1) Never done   DTaP/Tdap/Td vaccine (1 - Tdap) Never done   Zoster (Shingles) Vaccine (1 of 2) Never done   Mammogram  Never done   Pneumonia Vaccine (2 of 2 - PCV) 03/15/2019   DEXA scan (bone density measurement)  Never done   Flu Shot  09/15/2022*   Medicare Annual Wellness Visit  09/02/2023   Colon Cancer Screening  10/24/2025   Hepatitis C Screening: USPSTF Recommendation to screen - Ages 18-79 yo.  Completed   HPV Vaccine  Aged Out  *Topic was postponed. The date shown is not the original due date.

## 2022-09-02 NOTE — Progress Notes (Signed)
Subjective:   Allison Dickerson is a 67 y.o. female who presents for Medicare Annual (Subsequent) preventive examination.  Review of Systems    I connected with  Allison Dickerson on 09/02/22 by a audio enabled telemedicine application and verified that I am speaking with the correct person using two identifiers.  Patient Location: Home  Provider Location: Office/Clinic  I discussed the limitations of evaluation and management by telemedicine. The patient expressed understanding and agreed to proceed.        Objective:    There were no vitals filed for this visit. There is no height or weight on file to calculate BMI.     08/12/2022    6:34 PM 04/17/2021   10:42 AM 02/06/2021   10:55 AM 10/24/2020    7:45 AM 10/24/2020    7:43 AM 01/16/2020   10:54 AM 01/14/2020    3:49 PM  Advanced Directives  Does Patient Have a Medical Advance Directive? No Yes No  No    Type of Advance Directive  [place]       Does patient want to make changes to medical advance directive?  Yes (MAU/Ambulatory/Procedural Areas - Information given)       Copy of [place] in Chart?  No - copy requested       Would patient like information on creating a medical advance directive?  Yes (MAU/Ambulatory/Procedural Areas - Information given) No - Patient declined No - Patient declined        Information is confidential and restricted. Go to Review Flowsheets to unlock data.     Current Medications (verified) Outpatient Encounter Medications as of 09/02/2022  Medication Sig   ibuprofen (ADVIL) 800 MG tablet Take 800 mg by mouth.   LORazepam (ATIVAN) 1 MG tablet Take 1 tablet (1 mg total) by mouth every 8 (eight) hours as needed for anxiety.   meclizine (ANTIVERT) 12.5 MG tablet Take 1 tablet (12.5 mg total) by mouth 3 (three) times daily as needed for dizziness.   Multiple Vitamin (MULTIVITAMIN WITH MINERALS) TABS tablet Take 1 tablet by mouth daily.   ondansetron  (ZOFRAN-ODT) 4 MG disintegrating tablet Take 1 tablet (4 mg total) by mouth every 8 (eight) hours as needed for nausea or vomiting.   No facility-administered encounter medications on file as of 09/02/2022.    Allergies (verified) Patient has no known allergies.   History: Past Medical History:  Diagnosis Date   Anxiety    Arthritis    Phreesia 06/24/2020   Cervical cancer ([place])    When younger.   Diverticulitis    Pinched nerve    right hand   Past Surgical History:  Procedure Laterality Date   ABDOMINAL HYSTERECTOMY     BIOPSY  10/24/2020   Procedure: BIOPSY;  Surgeon: Montez Morita, Quillian Quince, MD;  [facility];  Service: Gastroenterology;;   cervical cancer removal     COLONOSCOPY WITH PROPOFOL N/A 10/24/2020   Procedure: COLONOSCOPY WITH PROPOFOL;  Surgeon: Harvel Quale, MD;  [facility];  Service: Gastroenterology;  Laterality: N/A;  9:00 am   ESOPHAGOGASTRODUODENOSCOPY (EGD) WITH PROPOFOL N/A 10/24/2020   Procedure: ESOPHAGOGASTRODUODENOSCOPY (EGD) WITH PROPOFOL;  Surgeon: Harvel Quale, MD;  [facility];  Service: Gastroenterology;  Laterality: N/A;   EYE SURGERY N/A    Phreesia 06/24/2020   POLYPECTOMY  10/24/2020   Procedure: POLYPECTOMY;  Surgeon: Harvel Quale, MD;  [facility];  Service: Gastroenterology;;  Family History  Problem Relation Age of Onset   Cancer Father    Alcohol abuse Sister    Cancer Other    Social History   Socioeconomic History   Marital status: Married    Spouse name: Not on file   Number of children: Not on file   Years of education: Not on file   Highest education level: Not on file  Occupational History   Occupation: retired  Tobacco Use   Smoking status: Former    Packs/day: 0.50    Years: 20.00    Additional pack years: 0.00    Total pack years: 10.00    Types: Cigarettes   Smokeless tobacco: Never  Vaping Use   Vaping Use: Never used   Substance and Sexual Activity   Alcohol use: No   Drug use: Yes    Types: Marijuana    Comment: occ with gummies   Sexual activity: Not Currently    Birth control/protection: Surgical    Comment: hyst  Other Topics Concern   Not on file  Social History Narrative   Not on file   Social Determinants of Health   Financial Resource Strain: Low Risk  (04/17/2021)   Overall Financial Resource Strain (CARDIA)    Difficulty of Paying Living Expenses: Not hard at all  Food Insecurity: No Food Insecurity (04/17/2021)   Hunger Vital Sign    Worried About Running Out of Food in the Last Year: Never true    [place] in the Last Year: Never true  Transportation Needs: No Transportation Needs (04/17/2021)   PRAPARE - Hydrologist (Medical): No    Lack of Transportation (Non-Medical): No  Physical Activity: Inactive (04/17/2021)   Exercise Vital Sign    Days of Exercise per Week: 0 days    Minutes of Exercise per Session: 0 min  Stress: Stress Concern Present (04/17/2021)   [place]    Feeling of Stress : To some extent  Social Connections: Moderately Isolated (04/17/2021)   Social Connection and Isolation Panel [NHANES]    Frequency of Communication with Friends and Family: More than three times a week    Frequency of Social Gatherings with Friends and Family: More than three times a week    Attends Religious Services: Never    Marine scientist or Organizations: No    Attends Archivist Meetings: Never    Marital Status: Married    Tobacco Counseling Counseling given: Not Answered   Clinical Intake:    Diabetic?no   Activities of Daily Living     No data to display           Patient Care Team: Lindell Spar, MD as PCP - General (Internal Medicine)  Indicate any recent Medical Services you may have received from other than [facility] providers in the past  year (date may be approximate).     Assessment:   This is a routine wellness examination for [facility].  Hearing/Vision screen No results found.  Dietary issues and exercise activities discussed:     Goals Addressed   None   Depression Screen    07/25/2022    1:25 PM 04/26/2022   11:29 AM 01/22/2022    1:43 PM 10/24/2021    1:13 PM 07/24/2021    1:44 PM 04/17/2021    1:02 PM 04/17/2021   10:43 AM  PHQ 2/9 Scores  PHQ - 2 Score 0  2 0 0 1 0 2  PHQ- 9 Score  6    0 2    Fall Risk    07/25/2022    1:25 PM 04/26/2022   11:29 AM 01/22/2022    1:43 PM 10/24/2021    1:13 PM 07/24/2021    1:43 PM  Fall Risk   Falls in the past year? 0 0 0 0 0  Number falls in past yr: 0 0 0 0 0  Injury with Fall? 0 0 0 0 0  Risk for fall due to :  No Fall Risks No Fall Risks No Fall Risks No Fall Risks  Follow up  Falls evaluation completed Falls evaluation completed Falls evaluation completed Falls evaluation completed    [place]:  Any stairs in or around the home? Yes  If so, are there any without handrails? Yes  Home free of loose throw rugs in walkways, pet beds, electrical cords, etc? Yes  Adequate lighting in your home to reduce risk of falls? Yes   ASSISTIVE DEVICES UTILIZED TO PREVENT FALLS:  Life alert? No  Use of a cane, walker or w/c? No  Grab bars in the bathroom? No  Shower chair or bench in shower? No  Elevated toilet seat or a handicapped toilet? No    Cognitive Function:    04/17/2021   10:44 AM  MMSE - Mini Mental State Exam  Not completed: Unable to complete        04/17/2021   10:49 AM 04/17/2021   10:44 AM  6CIT Screen  What Year? 0 points 0 points  What month? 0 points 0 points  What time? 0 points 0 points  Count back from 20 0 points 0 points  Months in reverse 2 points 0 points  Repeat phrase 0 points 4 points  Total Score 2 points 4 points    Immunizations Immunization History  Administered Date(s) Administered    Pneumococcal Polysaccharide-23 02/19/2017    TDAP status: Due, Education has been provided regarding the importance of this vaccine. Advised may receive this vaccine at local pharmacy or Health Dept. Aware to provide a copy of the vaccination record if obtained from local pharmacy or Health Dept. Verbalized acceptance and understanding.  Flu Vaccine status: Due, Education has been provided regarding the importance of this vaccine. Advised may receive this vaccine at local pharmacy or Health Dept. Aware to provide a copy of the vaccination record if obtained from local pharmacy or Health Dept. Verbalized acceptance and understanding.  Pneumococcal vaccine status: Due, Education has been provided regarding the importance of this vaccine. Advised may receive this vaccine at local pharmacy or Health Dept. Aware to provide a copy of the vaccination record if obtained from local pharmacy or Health Dept. Verbalized acceptance and understanding.  Covid-19 vaccine status: Information provided on how to obtain vaccines.   Qualifies for Shingles Vaccine? Yes   Zostavax completed No   Shingrix Completed?: No.    Education has been provided regarding the importance of this vaccine. Patient has been advised to call insurance company to determine out of pocket expense if they have not yet received this vaccine. Advised may also receive vaccine at local pharmacy or Health Dept. Verbalized acceptance and understanding.  Screening Tests Health Maintenance  Topic Date Due   COVID-19 Vaccine (1) Never done   DTaP/Tdap/Td (1 - Tdap) Never done   Zoster Vaccines- Shingrix (1 of 2) Never done   MAMMOGRAM  Never done  Pneumonia Vaccine 4+ Years old (2 of 2 - PCV) 03/15/2019   DEXA SCAN  Never done   INFLUENZA VACCINE  09/15/2022 (Originally 01/15/2022)   Medicare Annual Wellness (AWV)  09/02/2023   COLONOSCOPY (Pts 45-68yrs Insurance coverage will need to be confirmed)  10/24/2025   Hepatitis C Screening   Completed   HPV VACCINES  Aged Out    Health Maintenance  Health Maintenance Due  Topic Date Due   COVID-19 Vaccine (1) Never done   DTaP/Tdap/Td (1 - Tdap) Never done   Zoster Vaccines- Shingrix (1 of 2) Never done   MAMMOGRAM  Never done   Pneumonia Vaccine 20+ Years old (2 of 2 - PCV) 03/15/2019   DEXA SCAN  Never done    Colorectal cancer screening: Type of screening: Colonoscopy. Completed 10/24/20. Repeat every 5 years  Mammogram status: Ordered  . Pt provided with contact info and advised to call to schedule appt.   Bone Density status: Ordered  . Pt provided with contact info and advised to call to schedule appt.  Lung Cancer Screening: (Low Dose CT Chest recommended if Age 67-80 years, 30 pack-year currently smoking OR have quit w/in 15years.) does not qualify.   Lung Cancer Screening Referral: no  Additional Screening:  Hepatitis C Screening: does qualify; Completed 02/13/22  Vision Screening: Recommended annual ophthalmology exams for early detection of glaucoma and other disorders of the eye. Is the patient up to date with their annual eye exam?  Yes  Who is the provider or what is the name of the office in which the patient attends annual eye exams? N/a If pt is not established with a provider, would they like to be referred to a provider to establish care? No .   Dental Screening: Recommended annual dental exams for proper oral hygiene  Community Resource Referral / Chronic Care Management: CRR required this visit?  No   CCM required this visit?  No      Plan:     I have personally reviewed and noted the following in the patient's chart:   Medical and social history Use of alcohol, tobacco or illicit drugs  Current medications and supplements including opioid prescriptions. Patient is currently taking opioid prescriptions. Information provided to patient regarding non-opioid alternatives. Patient advised to discuss non-opioid treatment plan with their  provider. Functional ability and status Nutritional status Physical activity Advanced directives List of other physicians Hospitalizations, surgeries, and ER visits in previous 12 months Vitals Screenings to include cognitive, depression, and falls Referrals and appointments  In addition, I have reviewed and discussed with patient certain preventive protocols, quality metrics, and best practice recommendations. A written personalized care plan for preventive services as well as general preventive health recommendations were provided to patient.     Quentin Angst, [place]   09/02/2022

## 2022-10-15 ENCOUNTER — Ambulatory Visit (HOSPITAL_COMMUNITY): Payer: Medicare Other | Attending: Orthopedic Surgery | Admitting: Occupational Therapy

## 2022-10-15 ENCOUNTER — Encounter (HOSPITAL_COMMUNITY): Payer: Self-pay | Admitting: Occupational Therapy

## 2022-10-15 DIAGNOSIS — M25632 Stiffness of left wrist, not elsewhere classified: Secondary | ICD-10-CM | POA: Insufficient documentation

## 2022-10-15 DIAGNOSIS — M79642 Pain in left hand: Secondary | ICD-10-CM

## 2022-10-15 DIAGNOSIS — M25532 Pain in left wrist: Secondary | ICD-10-CM | POA: Diagnosis present

## 2022-10-15 DIAGNOSIS — R29898 Other symptoms and signs involving the musculoskeletal system: Secondary | ICD-10-CM

## 2022-10-15 NOTE — Therapy (Signed)
OUTPATIENT OCCUPATIONAL THERAPY ORTHO EVALUATION  Patient Name: Allison Dickerson MRN: [id number] DOB:1956-03-21, 67 y.o., female Today's Date: 10/15/2022  PCP: Anabel Halon, MD REFERRING PROVIDER: Dairl Ponder, MD  END OF SESSION: END OF SESSION:  OT End of Session - 10/17/22 1018     Visit Number 1    Number of Visits 9    Date for OT Re-Evaluation 11/29/22    Authorization Type Medicare Part A and B    OT Start Time 1600    OT Stop Time 1705    OT Time Calculation (min) 65 min    Activity Tolerance Patient tolerated treatment well    Behavior During Therapy [facility] for tasks assessed/performed              Past Medical History:  Diagnosis Date   Anxiety    Arthritis    Phreesia 06/24/2020   Cervical cancer (HCC)    When younger.   Diverticulitis    Pinched nerve    right hand   Past Surgical History:  Procedure Laterality Date   ABDOMINAL HYSTERECTOMY     BIOPSY  10/24/2020   Procedure: BIOPSY;  Surgeon: Marguerita Merles, Reuel Boom, MD;  [facility];  Service: Gastroenterology;;   cervical cancer removal     COLONOSCOPY WITH PROPOFOL N/A 10/24/2020   Procedure: COLONOSCOPY WITH PROPOFOL;  Surgeon: Dolores Frame, MD;  [facility];  Service: Gastroenterology;  Laterality: N/A;  9:00 am   ESOPHAGOGASTRODUODENOSCOPY (EGD) WITH PROPOFOL N/A 10/24/2020   Procedure: ESOPHAGOGASTRODUODENOSCOPY (EGD) WITH PROPOFOL;  Surgeon: Dolores Frame, MD;  [facility];  Service: Gastroenterology;  Laterality: N/A;   EYE SURGERY N/A    Phreesia 06/24/2020   POLYPECTOMY  10/24/2020   Procedure: POLYPECTOMY;  Surgeon: Marguerita Merles, Reuel Boom, MD;  [facility];  Service: Gastroenterology;;   Patient Active Problem List   Diagnosis Date Noted   Ganglion cyst of dorsum of right wrist 07/25/2022   Chronic prescription benzodiazepine use 07/24/2021   Benzodiazepine dependence (HCC) 05/29/2021   Urethral caruncle  11/29/2020   Lichen sclerosus 11/29/2020   Vaginal atrophy 11/29/2020   IBS (irritable bowel syndrome) 10/05/2020   Prediabetes 09/25/2020   Vitamin D deficiency 09/25/2020   Mixed hyperlipidemia 09/25/2020   Pulmonary nodule 06/28/2020   Bilateral carpal tunnel syndrome 06/27/2020   Lesion of ulnar nerve 06/27/2020   Polyarthropathy 06/27/2020   GERD (gastroesophageal reflux disease) 06/27/2020   OAB (overactive bladder) 05/08/2020   GAD (generalized anxiety disorder) 02/18/2017    ONSET DATE: 08/28/22  REFERRING DIAG: R Ganglion Cyst removal, B/L Carpal Tunnel Syndrome  THERAPY DIAG:  Pain in left wrist  Stiffness of left wrist, not elsewhere classified  Pain in left hand  Other symptoms and signs involving the musculoskeletal system  Rationale for Evaluation and Treatment: Rehabilitation  SUBJECTIVE:   SUBJECTIVE STATEMENT: "I feel like the splint is making my hand worse." Pt accompanied by: self  PERTINENT HISTORY: Pt is a 67 yo right hand dominant female with a gradual onset of R CTS for 10 years and then she noticed inability to extend her right ring and small fingers in November 2023. Pt is s/p right carpal tunnel release with right small and ring finger extensor reconstructions with tendon transfers and right distal ulnar resection with ECU tendon transfer. Pt was fitted for a custom fabricated right FAB ulnar gutter orthosis extending to the full digits 3-5.   PRECAUTIONS: Other: Hand, Ulnar Gutter splint  WEIGHT BEARING  RESTRICTIONS: Yes >1lbs  PAIN:  Are you having pain? No  FALLS: Has patient fallen in last 6 months? No  LIVING ENVIRONMENT: Lives with: lives with their spouse and lives with their son Lives in: House/apartment  PLOF: Independent  PATIENT GOALS: To be able to grip again  NEXT MD VISIT: None  OBJECTIVE:   HAND DOMINANCE: Right  ADLs: Overall ADLs: Pt having increased difficulty with all small objects, her grip is poor and she is  unable to hold on to more than a small glass of water. Limited mobility to manipulate medium sized objects. Husband does all IADL's for patient.   FUNCTIONAL OUTCOME MEASURES: Quick Dash: 70.45  UPPER EXTREMITY ROM:     Active ROM Right eval  Wrist flexion   Wrist extension   Wrist ulnar deviation   Wrist radial deviation   Wrist pronation   Wrist supination   (Blank rows = not tested)  Active ROM Right eval  Thumb MCP (0-60) 44  Thumb IP (0-80) 60  Index MCP (0-90) 46  Index PIP (0-100) 60  Index DIP (0-70)  30  Long MCP (0-90)  50  Long PIP (0-100)  16  Long DIP (0-70)  24  Ring MCP (0-90) 30   Ring PIP (0-100)  20  Ring DIP (0-70)  0  Little MCP (0-90) 24   Little PIP (0-100)  14  Little DIP (0-70)  6  (Blank rows = not tested)   UPPER EXTREMITY MMT:     MMT Right eval  Wrist flexion   Wrist extension   Wrist ulnar deviation   Wrist radial deviation   Wrist pronation   Wrist supination   (Blank rows = not tested)  HAND FUNCTION: Grip strength: Right: 8 lbs; Left: 46 lbs, Lateral pinch: Right: 3 lbs, Left: 13 lbs, and 3 point pinch: Right: 1 lbs, Left: 12 lbs  COORDINATION: 9 Hole Peg test: Right: -- sec; Left: -- sec  SENSATION: Light touch: Impaired   EDEMA: mild swelling noted in palm and finger   OBSERVATIONS: min to mod Fascial restrictions and scar adhesions noted along the extensor and flexor aspects of wrist.    TODAY'S TREATMENT:                                                                                                                              DATE: 10/15/22: Evaluation Only    PATIENT EDUCATION: Education details: Digit ROM and Wrist ROM Person educated: Patient Education method: Explanation, Demonstration, and Handouts Education comprehension: verbalized understanding and returned demonstration  HOME EXERCISE PROGRAM: 4/30: Digit ROM and Wrist ROM  GOALS: Goals reviewed with patient? Yes  SHORT TERM GOALS: Target date:  11/08/22  Pt will be provided with and educated on HEP to improve mobility in RUE required for use during ADL completion.  Goal status: INITIAL  2.   Pt will increase right wrist strength to 3+/5 to improve ability to reach for  items using right hand and maintain a light grasp.   Goal status: INITIAL  3.  Pt will increase right grip strength by 5# and pinch strength by 2# to improve ability to grasp and maintain hold on tools for ADL completion such as a cup, toothbrush, clothing, etc.   Goal status: INITIAL  4.  Pt will decrease pain in RUE to 3/10 or less to improve ability to sleep for 2+ consecutive hours without waking due to pain.    Goal status: INITIAL  LONG TERM GOALS: Target date: 11/29/22  Pt will decrease RUE fascial restrictions to min amounts or less to improve mobility required for functional reaching tasks.   Goal status: INITIAL  2.  Pt will increase RUE A/ROM by at least 25 degrees to improve ability to use RUE when reaching overhead or behind back during dressing and bathing tasks.   Goal status: INITIAL  3.  Pt will increase RUE strength to 4+/5 or greater to improve ability to use RUE when lifting or carrying items during meal preparation/housework/yardwork tasks.   Goal status: INITIAL  4.  Pt will return to highest level of function using RUE as dominant during functional task completion.   Goal status: INITIAL   ASSESSMENT:  CLINICAL IMPRESSION: Patient is a 67 y.o. female who was seen today for occupational therapy evaluation for R carpal tunnel, R basal ganglion cyst removal, and R tendon release/reconstruction. Pt presents with limitations in fine motor, gross motor, and motor planning, limited her ability to complete ADL's and IADL's.    PERFORMANCE DEFICITS: in functional skills including ADLs, IADLs, coordination, dexterity, edema, ROM, strength, pain, fascial restrictions, Fine motor control, Gross motor control, body mechanics, and UE functional  use.  IMPAIRMENTS: are limiting patient from ADLs, IADLs, rest and sleep, work, leisure, and social participation.   COMORBIDITIES: has no other co-morbidities that affects occupational performance. Patient will benefit from skilled OT to address above impairments and improve overall function.  MODIFICATION OR ASSISTANCE TO COMPLETE EVALUATION: No modification of tasks or assist necessary to complete an evaluation.  OT OCCUPATIONAL PROFILE AND HISTORY: Problem focused assessment: Including review of records relating to presenting problem.  CLINICAL DECISION MAKING: LOW - limited treatment options, no task modification necessary  REHAB POTENTIAL: Good  EVALUATION COMPLEXITY: Low      PLAN:  OT FREQUENCY: 1-2x/week  OT DURATION: 6 weeks  PLANNED INTERVENTIONS: self care/ADL training, therapeutic exercise, therapeutic activity, manual therapy, scar mobilization, passive range of motion, functional mobility training, electrical stimulation, ultrasound, paraffin, moist heat, cryotherapy, patient/family education, coping strategies training, and DME and/or AE instructions  RECOMMENDED OTHER SERVICES: N/A  CONSULTED AND AGREED WITH PLAN OF CARE: Patient  PLAN FOR NEXT SESSION: Manual therapy, P/ROM, A/ROM, theraputty exercises, Digit ROM   Trish Mage, OTR/L [facility] [phone] Meisha Salone Rosemarie Beath, OT 10/15/2022, 4:21 PM

## 2022-10-15 NOTE — Patient Instructions (Signed)
Complete each exercise 10-15X, 2-3X/day  1) Towel crunch Place a small towel on a firm table top. Flatten out the towel and then place your hand on one end of it.  Next, flex your fingers 2-5 (index finger through pinky finger) as you pull the towel towards your hand.    2) Digit composite flexion/adduction (make a fist) Hold your hand up as shown. Open and close your hand into a fist and repeat. If you cannot make a full fist, then make a partial fist.    3) Thumb/finger opposition Touch the tip of the thumb to each fingertip one by one. Extend fingers fully after they are touched.      4) Finger Taps Start with the hand flat and fingers slightly spread.  One at a time, starting with the thumb, lift each finger up separately.    5) PIP Joint Blocking Grasp the affected finger, bracing below the middle knuckle, and actively bend the finger as shown.    6) DIP Joint Blocking Grasp the affected finger, bracing below the last knuckle, and actively bend the finger at the last joint.     7) Digit Abduction/Adduction Hold hand palm down flat on table. Spread your fingers apart and back together.    AROM Exercises   1) Wrist Flexion  Start with wrist at edge of table, palm facing up. With wrist hanging slightly off table, curl wrist upward, and back down.      2) Wrist Extension  Start with wrist at edge of table, palm facing down. With wrist slightly off the edge of the table, curl wrist up and back down.      3) Radial Deviations  Start with forearm flat against a table, wrist hanging slightly off the edge, and palm facing the wall. Bending at the wrist only, and keeping palm facing the wall, bend wrist so fist is pointing towards the floor, back up to start position, and up towards the ceiling. Return to start.        4) WRIST PRONATION  Turn your forearm towards palm face down.  Keep your elbow bent and by the side of your  Body.      5) WRIST  SUPINATION  Turn your forearm towards palm face up.  Keep your elbow bent and by the side of your  Body.      *Complete exercises ______ times each, _______ times per day*

## 2022-10-22 ENCOUNTER — Ambulatory Visit (INDEPENDENT_AMBULATORY_CARE_PROVIDER_SITE_OTHER): Payer: Medicare Other | Admitting: Internal Medicine

## 2022-10-22 VITALS — BP 108/68 | HR 71 | Ht 67.0 in | Wt 113.2 lb

## 2022-10-22 DIAGNOSIS — G5603 Carpal tunnel syndrome, bilateral upper limbs: Secondary | ICD-10-CM

## 2022-10-22 DIAGNOSIS — Z79899 Other long term (current) drug therapy: Secondary | ICD-10-CM | POA: Diagnosis not present

## 2022-10-22 DIAGNOSIS — R7303 Prediabetes: Secondary | ICD-10-CM

## 2022-10-22 DIAGNOSIS — K219 Gastro-esophageal reflux disease without esophagitis: Secondary | ICD-10-CM

## 2022-10-22 DIAGNOSIS — S66811D Strain of other specified muscles, fascia and tendons at wrist and hand level, right hand, subsequent encounter: Secondary | ICD-10-CM | POA: Insufficient documentation

## 2022-10-22 DIAGNOSIS — F411 Generalized anxiety disorder: Secondary | ICD-10-CM | POA: Diagnosis not present

## 2022-10-22 DIAGNOSIS — M67431 Ganglion, right wrist: Secondary | ICD-10-CM

## 2022-10-22 MED ORDER — LORAZEPAM 1 MG PO TABS: 1.0000 mg | ORAL_TABLET | Freq: Three times a day (TID) | ORAL | 2 refills | Status: DC | PRN

## 2022-10-22 NOTE — Assessment & Plan Note (Signed)
Well-controlled Had caregiver stress as well, has been stressed due to loss of her mother and her right hand pain On Lorazepam 1 mg TID, has been on Benzodiazepine for more than 15 years Did not tolerate Wellbutrin in the past with her previous PCP Did not like Buspar

## 2022-10-22 NOTE — Assessment & Plan Note (Signed)
Lab Results  Component Value Date   HGBA1C 5.8 (H) 02/13/2022   Advised to follow low carb diet DASH diet material provided 

## 2022-10-22 NOTE — Assessment & Plan Note (Signed)
Omeprazole as needed Advised to avoid oral NSAIDs 

## 2022-10-22 NOTE — Progress Notes (Signed)
Established Patient Office Visit  Subjective:  Patient ID: MELESA Dickerson, female    DOB: 21-May-1956  Age: 67 y.o. MRN: [id number]  CC:  Chief Complaint  Patient presents with   Medication Refill    Patient is needing a refill on ativan     HPI Allison Dickerson is a 67 y.o. female with past medical history of GERD, polyarthritis, severe anxiety and pulmonary nodule who presents for f/u of her chronic medical conditions.  GAD: She takes Ativan 1 mg 3 times daily for anxiety.  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  She is stressed currently due to her chronic right hand pain.  She is undergoing dental work-up for implants as well.  She has chronic right hand pain.  She had a ganglion cyst on the right hand, for which she saw Hand specialist.  She also has bilateral carpal tunnel syndrome. She is status post right carpal tunnel release with right small and ring finger extensor reconstructions with tendon transfers and right distal ulnar resection with ECU tendon transfer. She underwent this surgery by Dr. Dairl Ponder on 08/28/2022.   She wants to talk about her preventive tests in the next visit.   Past Medical History:  Diagnosis Date   Anxiety    Arthritis    Phreesia 06/24/2020   Cervical cancer (HCC)    When younger.   Diverticulitis    Pinched nerve    right hand    Past Surgical History:  Procedure Laterality Date   ABDOMINAL HYSTERECTOMY     BIOPSY  10/24/2020   Procedure: BIOPSY;  Surgeon: Marguerita Merles, Reuel Boom, MD;  [facility];  Service: Gastroenterology;;   cervical cancer removal     COLONOSCOPY WITH PROPOFOL N/A 10/24/2020   Procedure: COLONOSCOPY WITH PROPOFOL;  Surgeon: Dolores Frame, MD;  [facility];  Service: Gastroenterology;  Laterality: N/A;  9:00 am   ESOPHAGOGASTRODUODENOSCOPY (EGD) WITH PROPOFOL N/A 10/24/2020   Procedure:  ESOPHAGOGASTRODUODENOSCOPY (EGD) WITH PROPOFOL;  Surgeon: Dolores Frame, MD;  [facility];  Service: Gastroenterology;  Laterality: N/A;   EYE SURGERY N/A    Phreesia 06/24/2020   POLYPECTOMY  10/24/2020   Procedure: POLYPECTOMY;  Surgeon: Marguerita Merles, Reuel Boom, MD;  [facility];  Service: Gastroenterology;;    Family History  Problem Relation Age of Onset   Cancer Father    Alcohol abuse Sister    Cancer Other     Social History   Socioeconomic History   Marital status: Married    Spouse name: Not on file   Number of children: Not on file   Years of education: Not on file   Highest education level: Associate degree: occupational, Scientist, product/process development, or vocational program  Occupational History   Occupation: retired  Tobacco Use   Smoking status: Former    Packs/day: 0.50    Years: 20.00    Additional pack years: 0.00    Total pack years: 10.00    Types: Cigarettes   Smokeless tobacco: Never  Vaping Use   Vaping Use: Never used  Substance and Sexual Activity   Alcohol use: No   Drug use: Yes    Types: Marijuana    Comment: occ with gummies   Sexual activity: Not Currently    Birth control/protection: Surgical    Comment: hyst  Other Topics Concern   Not on file  Social History  Narrative   Not on file   Social Determinants of Health   Financial Resource Strain: Low Risk  (10/22/2022)   Overall Financial Resource Strain (CARDIA)    Difficulty of Paying Living Expenses: Not very hard  Food Insecurity: No Food Insecurity (10/22/2022)   Hunger Vital Sign    Worried About Running Out of Food in the Last Year: Never true    Ran Out of Food in the Last Year: Never true  Transportation Needs: No Transportation Needs (10/22/2022)   PRAPARE - Administrator, Civil Service (Medical): No    Lack of Transportation (Non-Medical): No  Physical Activity: Inactive (10/22/2022)   Exercise Vital Sign    Days of Exercise per Week: 0 days     Minutes of Exercise per Session: 0 min  Stress: No Stress Concern Present (10/22/2022)   [employer] of Occupational Health - Occupational Stress Questionnaire    Feeling of Stress : Only a little  Social Connections: Socially Isolated (10/22/2022)   Social Connection and Isolation Panel [NHANES]    Frequency of Communication with Friends and Family: Once a week    Frequency of Social Gatherings with Friends and Family: Once a week    Attends Religious Services: Never    Database administrator or Organizations: No    Attends Banker Meetings: Never    Marital Status: Married  Catering manager Violence: Not At Risk (09/02/2022)   Humiliation, Afraid, Rape, and Kick questionnaire    Fear of Current or Ex-Partner: No    Emotionally Abused: No    Physically Abused: No    Sexually Abused: No    Outpatient Medications Prior to Visit  Medication Sig Dispense Refill   ibuprofen (ADVIL) 800 MG tablet Take 800 mg by mouth.     meclizine (ANTIVERT) 12.5 MG tablet Take 1 tablet (12.5 mg total) by mouth 3 (three) times daily as needed for dizziness. 30 tablet 0   Multiple Vitamin (MULTIVITAMIN WITH MINERALS) TABS tablet Take 1 tablet by mouth daily.     ondansetron (ZOFRAN-ODT) 4 MG disintegrating tablet Take 1 tablet (4 mg total) by mouth every 8 (eight) hours as needed for nausea or vomiting. 20 tablet 0   LORazepam (ATIVAN) 1 MG tablet Take 1 tablet (1 mg total) by mouth every 8 (eight) hours as needed for anxiety. 90 tablet 2   No facility-administered medications prior to visit.    No Known Allergies  ROS Review of Systems  Constitutional:  Negative for chills and fever.  HENT:  Negative for congestion, sinus pressure, sinus pain and sore throat.   Eyes:  Negative for pain and discharge.  Respiratory:  Negative for cough and shortness of breath.   Cardiovascular:  Negative for chest pain and palpitations.  Gastrointestinal:  Negative for abdominal pain, constipation,  nausea and vomiting.  Endocrine: Negative for polydipsia and polyuria.  Genitourinary:  Negative for dysuria and hematuria.  Musculoskeletal:  Positive for arthralgias (Right wrist). Negative for neck pain and neck stiffness.  Skin:  Negative for rash.  Neurological:  Positive for weakness and numbness (Right hand). Negative for dizziness and speech difficulty.  Psychiatric/Behavioral:  Positive for sleep disturbance. Negative for agitation, behavioral problems, dysphoric mood and suicidal ideas. The patient is nervous/anxious.       Objective:    Physical Exam Vitals reviewed.  Constitutional:      General: She is not in acute distress.    Appearance: She is not diaphoretic.  HENT:  Head: Normocephalic and atraumatic.     Nose: Nose normal.     Mouth/Throat:     Mouth: Mucous membranes are moist.  Eyes:     General: No scleral icterus.    Extraocular Movements: Extraocular movements intact.  Cardiovascular:     Rate and Rhythm: Normal rate and regular rhythm.     Pulses: Normal pulses.     Heart sounds: Normal heart sounds. No murmur heard. Pulmonary:     Breath sounds: Normal breath sounds. No wheezing or rales.  Musculoskeletal:     Cervical back: Neck supple. No tenderness.     Right lower leg: No edema.     Left lower leg: No edema.     Comments: Cyst - about 2 cm in diameter over dorsum of right wrist Right wrist brace in place  Skin:    General: Skin is warm.     Findings: No rash.     Comments: Right forearm and hand in splint  Neurological:     General: No focal deficit present.     Mental Status: She is alert and oriented to person, place, and time.     Sensory: No sensory deficit.     Motor: No weakness.  Psychiatric:        Mood and Affect: Mood normal.        Behavior: Behavior normal.     BP 108/68 (BP Location: Left Arm, Patient Position: Sitting, Cuff Size: Normal)   Pulse 71   Ht 5\' 7"  (1.702 m)   Wt 113 lb 3.2 oz (51.3 kg)   SpO2 98%   BMI  17.73 kg/m  Wt Readings from Last 3 Encounters:  10/22/22 113 lb 3.2 oz (51.3 kg)  07/25/22 113 lb 12.8 oz (51.6 kg)  07/16/22 113 lb (51.3 kg)    Lab Results  Component Value Date   TSH 1.470 02/13/2022   Lab Results  Component Value Date   WBC 6.0 08/12/2022   HGB 12.8 08/12/2022   HCT 37.6 08/12/2022   MCV 91.7 08/12/2022   PLT 232 08/12/2022   Lab Results  Component Value Date   NA 135 08/12/2022   K 3.8 08/12/2022   CO2 26 08/12/2022   GLUCOSE 108 (H) 08/12/2022   BUN 20 08/12/2022   CREATININE 0.89 08/12/2022   BILITOT 0.5 02/13/2022   ALKPHOS 85 02/13/2022   AST 22 02/13/2022   ALT 19 02/13/2022   PROT 7.1 02/13/2022   ALBUMIN 4.5 02/13/2022   CALCIUM 9.1 08/12/2022   ANIONGAP 5 08/12/2022   EGFR 67 02/13/2022   Lab Results  Component Value Date   CHOL 225 (H) 02/13/2022   Lab Results  Component Value Date   HDL 76 02/13/2022   Lab Results  Component Value Date   LDLCALC 136 (H) 02/13/2022   Lab Results  Component Value Date   TRIG 73 02/13/2022   Lab Results  Component Value Date   CHOLHDL 3.0 02/13/2022   Lab Results  Component Value Date   HGBA1C 5.8 (H) 02/13/2022      Assessment & Plan:   Problem List Items Addressed This Visit       Digestive   GERD (gastroesophageal reflux disease)    Omeprazole as needed Advised to avoid oral NSAIDs        Nervous and Auditory   Bilateral carpal tunnel syndrome    Followed by Orthopedic surgeon Followed by hand specialist - s/p right sided carpal tunnel release surgery Has wrist splint  in place      Relevant Medications   LORazepam (ATIVAN) 1 MG tablet     Musculoskeletal and Integument   Extensor tendon rupture of hand, right, subsequent encounter    status post right carpal tunnel release with right small and ring finger extensor reconstructions with tendon transfers and right distal ulnar resection with ECU tendon transfer. She underwent this surgery by Dr. Dairl Ponder on  08/28/2022.  Has splint in place with extension of wrist and fingers Undergoing OT        Other   GAD (generalized anxiety disorder) - Primary    Well-controlled Had caregiver stress as well, has been stressed due to loss of her mother and her right hand pain On Lorazepam 1 mg TID, has been on Benzodiazepine for more than 15 years Did not tolerate Wellbutrin in the past with her previous PCP Did not like Buspar      Relevant Medications   LORazepam (ATIVAN) 1 MG tablet   Prediabetes    Lab Results  Component Value Date   HGBA1C 5.8 (H) 02/13/2022  Advised to follow low carb diet DASH diet material provided      Chronic prescription benzodiazepine use    For severe GAD Check Toxassure Advised strongly to avoid marijuana use      Relevant Orders   ToxASSURE Select 13 (MW), Urine   Ganglion cyst of dorsum of right wrist    Recurrent Also has pain in wrist and forearm Followed by Hand surgeon - s/p removal of ganglion cyst      Meds ordered this encounter  Medications   LORazepam (ATIVAN) 1 MG tablet    Sig: Take 1 tablet (1 mg total) by mouth every 8 (eight) hours as needed for anxiety.    Dispense:  90 tablet    Refill:  2    Follow-up: Return in about 3 months (around 01/22/2023) for GAD.    Anabel Halon, MD

## 2022-10-22 NOTE — Patient Instructions (Signed)
Please continue to take medications as prescribed.  Please continue to follow low salt diet and perform moderate exercise/walking at least 150 mins/week. 

## 2022-10-22 NOTE — Assessment & Plan Note (Signed)
status post right carpal tunnel release with right small and ring finger extensor reconstructions with tendon transfers and right distal ulnar resection with ECU tendon transfer. She underwent this surgery by Dr. Dairl Ponder on 08/28/2022.  Has splint in place with extension of wrist and fingers Undergoing OT

## 2022-10-22 NOTE — Assessment & Plan Note (Addendum)
Followed by Orthopedic surgeon Followed by hand specialist - s/p right sided carpal tunnel release surgery Has wrist splint in place

## 2022-10-22 NOTE — Assessment & Plan Note (Addendum)
For severe GAD Check Toxassure Advised strongly to avoid marijuana use

## 2022-10-22 NOTE — Assessment & Plan Note (Signed)
Recurrent Also has pain in wrist and forearm Followed by Hand surgeon - s/p removal of ganglion cyst

## 2022-10-23 LAB — CBC WITH DIFFERENTIAL/PLATELET
Basophils Absolute: 0.1 10*3/uL (ref 0.0–0.2)
Basos: 1 %
EOS (ABSOLUTE): 0.3 10*3/uL (ref 0.0–0.4)
Eos: 4 %
Hematocrit: 36.5 % (ref 34.0–46.6)
Hemoglobin: 12.6 g/dL (ref 11.1–15.9)
Immature Grans (Abs): 0.1 10*3/uL (ref 0.0–0.1)
Immature Granulocytes: 1 %
Lymphocytes Absolute: 1.8 10*3/uL (ref 0.7–3.1)
Lymphs: 24 %
MCH: 31.9 pg (ref 26.6–33.0)
MCHC: 34.5 g/dL (ref 31.5–35.7)
MCV: 92 fL (ref 79–97)
Monocytes Absolute: 0.5 10*3/uL (ref 0.1–0.9)
Monocytes: 8 %
Neutrophils Absolute: 4.5 10*3/uL (ref 1.4–7.0)
Neutrophils: 62 %
Platelets: 258 10*3/uL (ref 150–450)
RBC: 3.95 x10E6/uL (ref 3.77–5.28)
RDW: 12.9 % (ref 11.7–15.4)
WBC: 7.2 10*3/uL (ref 3.4–10.8)

## 2022-10-23 LAB — CMP14+EGFR
ALT: 26 IU/L (ref 0–32)
AST: 23 IU/L (ref 0–40)
Albumin/Globulin Ratio: 1.8 (ref 1.2–2.2)
Albumin: 4.4 g/dL (ref 3.9–4.9)
Alkaline Phosphatase: 72 IU/L (ref 44–121)
BUN/Creatinine Ratio: 26 (ref 12–28)
BUN: 24 mg/dL (ref 8–27)
Bilirubin Total: 0.5 mg/dL (ref 0.0–1.2)
CO2: 23 mmol/L (ref 20–29)
Calcium: 9.9 mg/dL (ref 8.7–10.3)
Chloride: 105 mmol/L (ref 96–106)
Creatinine, Ser: 0.91 mg/dL (ref 0.57–1.00)
Globulin, Total: 2.5 g/dL (ref 1.5–4.5)
Glucose: 97 mg/dL (ref 70–99)
Potassium: 4.6 mmol/L (ref 3.5–5.2)
Sodium: 141 mmol/L (ref 134–144)
Total Protein: 6.9 g/dL (ref 6.0–8.5)
eGFR: 69 mL/min/{1.73_m2} (ref >59)

## 2022-10-23 LAB — LIPID PANEL
Chol/HDL Ratio: 2.7 ratio (ref 0.0–4.4)
Cholesterol, Total: 223 mg/dL — ABNORMAL HIGH (ref 100–199)
HDL: 82 mg/dL (ref >39)
LDL Chol Calc (NIH): 129 mg/dL — ABNORMAL HIGH (ref 0–99)
Triglycerides: 70 mg/dL (ref 0–149)
VLDL Cholesterol Cal: 12 mg/dL (ref 5–40)

## 2022-10-23 LAB — HEMOGLOBIN A1C
Est. average glucose Bld gHb Est-mCnc: 117 mg/dL
Hgb A1c MFr Bld: 5.7 % — ABNORMAL HIGH (ref 4.8–5.6)

## 2022-10-23 LAB — TSH: TSH: 1.47 u[IU]/mL (ref 0.450–4.500)

## 2022-10-23 LAB — VITAMIN D 25 HYDROXY (VIT D DEFICIENCY, FRACTURES): Vit D, 25-Hydroxy: 38.7 ng/mL (ref 30.0–100.0)

## 2022-10-31 ENCOUNTER — Ambulatory Visit (HOSPITAL_COMMUNITY): Payer: Medicare Other | Attending: Orthopedic Surgery | Admitting: Occupational Therapy

## 2022-10-31 ENCOUNTER — Encounter (HOSPITAL_COMMUNITY): Payer: Self-pay | Admitting: Occupational Therapy

## 2022-10-31 DIAGNOSIS — M25632 Stiffness of left wrist, not elsewhere classified: Secondary | ICD-10-CM | POA: Insufficient documentation

## 2022-10-31 DIAGNOSIS — M79642 Pain in left hand: Secondary | ICD-10-CM | POA: Diagnosis present

## 2022-10-31 DIAGNOSIS — R29898 Other symptoms and signs involving the musculoskeletal system: Secondary | ICD-10-CM | POA: Insufficient documentation

## 2022-10-31 DIAGNOSIS — M25532 Pain in left wrist: Secondary | ICD-10-CM | POA: Diagnosis present

## 2022-10-31 NOTE — Therapy (Signed)
OUTPATIENT OCCUPATIONAL THERAPY ORTHO TREATMENT NOTE  Patient Name: Allison Dickerson MRN: [id number] DOB:11/30/1955, 67 y.o., female Today's Date: 10/31/2022  PCP: Anabel Halon, MD REFERRING PROVIDER: Dairl Ponder, MD  END OF SESSION:  OT End of Session - 10/31/22 1303     Visit Number 2    Number of Visits 9    Date for OT Re-Evaluation 11/29/22    Authorization Type Medicare Part A and B    OT Start Time 1303    OT Stop Time 1345    OT Time Calculation (min) 42 min    Activity Tolerance Patient tolerated treatment well    Behavior During Therapy [facility] for tasks assessed/performed             Past Medical History:  Diagnosis Date   Anxiety    Arthritis    Phreesia 06/24/2020   Cervical cancer (HCC)    When younger.   Diverticulitis    Pinched nerve    right hand   Past Surgical History:  Procedure Laterality Date   ABDOMINAL HYSTERECTOMY     BIOPSY  10/24/2020   Procedure: BIOPSY;  Surgeon: Marguerita Merles, Reuel Boom, MD;  [facility];  Service: Gastroenterology;;   cervical cancer removal     COLONOSCOPY WITH PROPOFOL N/A 10/24/2020   Procedure: COLONOSCOPY WITH PROPOFOL;  Surgeon: Dolores Frame, MD;  [facility];  Service: Gastroenterology;  Laterality: N/A;  9:00 am   ESOPHAGOGASTRODUODENOSCOPY (EGD) WITH PROPOFOL N/A 10/24/2020   Procedure: ESOPHAGOGASTRODUODENOSCOPY (EGD) WITH PROPOFOL;  Surgeon: Dolores Frame, MD;  [facility];  Service: Gastroenterology;  Laterality: N/A;   EYE SURGERY N/A    Phreesia 06/24/2020   POLYPECTOMY  10/24/2020   Procedure: POLYPECTOMY;  Surgeon: Marguerita Merles, Reuel Boom, MD;  [facility];  Service: Gastroenterology;;   Patient Active Problem List   Diagnosis Date Noted   Extensor tendon rupture of hand, right, subsequent encounter 10/22/2022   Ganglion cyst of dorsum of right wrist 07/25/2022   Chronic prescription benzodiazepine use 07/24/2021    Benzodiazepine dependence (HCC) 05/29/2021   Urethral caruncle 11/29/2020   Lichen sclerosus 11/29/2020   Vaginal atrophy 11/29/2020   IBS (irritable bowel syndrome) 10/05/2020   Prediabetes 09/25/2020   Vitamin D deficiency 09/25/2020   Mixed hyperlipidemia 09/25/2020   Pulmonary nodule 06/28/2020   Bilateral carpal tunnel syndrome 06/27/2020   Lesion of ulnar nerve 06/27/2020   Polyarthropathy 06/27/2020   GERD (gastroesophageal reflux disease) 06/27/2020   OAB (overactive bladder) 05/08/2020   GAD (generalized anxiety disorder) 02/18/2017    ONSET DATE: 08/28/22  REFERRING DIAG: R Ganglion Cyst removal, B/L Carpal Tunnel Syndrome  THERAPY DIAG:  Pain in left wrist  Stiffness of left wrist, not elsewhere classified  Pain in left hand  Other symptoms and signs involving the musculoskeletal system  Rationale for Evaluation and Treatment: Rehabilitation  SUBJECTIVE:   SUBJECTIVE STATEMENT: "I feel like the splint is making my hand worse." Pt accompanied by: self  PERTINENT HISTORY: Pt is a 67 yo right hand dominant female with a gradual onset of R CTS for 10 years and then she noticed inability to extend her right ring and small fingers in November 2023. Pt is s/p right carpal tunnel release with right small and ring finger extensor reconstructions with tendon transfers and right distal ulnar resection with ECU tendon transfer. Pt was fitted for a custom fabricated right FAB ulnar gutter orthosis extending to the full digits 3-5.   PRECAUTIONS:  Other: Hand, Ulnar Gutter splint  WEIGHT BEARING RESTRICTIONS: Yes >1lbs  PAIN:  Are you having pain? No  FALLS: Has patient fallen in last 6 months? No  PATIENT GOALS: To be able to grip again  NEXT MD VISIT: None  OBJECTIVE:   HAND DOMINANCE: Right  ADLs: Overall ADLs: Pt having increased difficulty with all small objects, her grip is poor and she is unable to hold on to more than a small glass of water. Limited  mobility to manipulate medium sized objects. Husband does all IADL's for patient.   FUNCTIONAL OUTCOME MEASURES: Quick Dash: 70.45  UPPER EXTREMITY ROM:     Active ROM Right eval  Wrist flexion   Wrist extension   Wrist ulnar deviation   Wrist radial deviation   Wrist pronation   Wrist supination   (Blank rows = not tested)  Active ROM Right eval  Thumb MCP (0-60) 44  Thumb IP (0-80) 60  Index MCP (0-90) 46  Index PIP (0-100) 60  Index DIP (0-70)  30  Long MCP (0-90)  50  Long PIP (0-100)  16  Long DIP (0-70)  24  Ring MCP (0-90) 30   Ring PIP (0-100)  20  Ring DIP (0-70)  0  Little MCP (0-90) 24   Little PIP (0-100)  14  Little DIP (0-70)  6  (Blank rows = not tested)   UPPER EXTREMITY MMT:     MMT Right eval  Wrist flexion   Wrist extension   Wrist ulnar deviation   Wrist radial deviation   Wrist pronation   Wrist supination   (Blank rows = not tested)  HAND FUNCTION: Grip strength: Right: 8 lbs; Left: 46 lbs, Lateral pinch: Right: 3 lbs, Left: 13 lbs, and 3 point pinch: Right: 1 lbs, Left: 12 lbs  COORDINATION: 9 Hole Peg test: Right: -- sec; Left: -- sec  SENSATION: Light touch: Impaired   EDEMA: mild swelling noted in palm and finger   OBSERVATIONS: min to mod Fascial restrictions and scar adhesions noted along the extensor and flexor aspects of wrist.    TODAY'S TREATMENT:                                                                                                                              DATE:   10/31/22 -Manual Therapy: myofascial release and trigger point applied to the flexor and extensor bundles of the forearm, as well as wrist, in order to decrease fascial restrictions and pain to improve ROM -P/ROM: wrist flexion/extension, joint blocking and flexing each joint -Towel Crunches x5 -A/ROM: flexion, extension, ulnar/radial deviation, supination/pronation, x10 -Digit ROM: composite flexion, extension, opposition,  abduction/adduction, x10   PATIENT EDUCATION: Education details: Reviewed Digit ROM and Wrist ROM Person educated: Patient Education method: Explanation, Demonstration, and Handouts Education comprehension: verbalized understanding and returned demonstration  HOME EXERCISE PROGRAM: 4/30: Digit ROM and Wrist ROM  GOALS: Goals reviewed with patient? Yes  SHORT TERM  GOALS: Target date: 11/08/22  Pt will be provided with and educated on HEP to improve mobility in RUE required for use during ADL completion.  Goal status: IN PROGRESS  2.   Pt will increase right wrist strength to 3+/5 to improve ability to reach for items using right hand and maintain a light grasp.   Goal status: IN PROGRESS  3.  Pt will increase right grip strength by 5# and pinch strength by 2# to improve ability to grasp and maintain hold on tools for ADL completion such as a cup, toothbrush, clothing, etc.   Goal status: IN PROGRESS  4.  Pt will decrease pain in RUE to 3/10 or less to improve ability to sleep for 2+ consecutive hours without waking due to pain.    Goal status: IN PROGRESS  LONG TERM GOALS: Target date: 11/29/22  Pt will decrease RUE fascial restrictions to min amounts or less to improve mobility required for functional reaching tasks.   Goal status: IN PROGRESS  2.  Pt will increase RUE A/ROM by at least 25 degrees to improve ability to use RUE when reaching overhead or behind back during dressing and bathing tasks.   Goal status: IN PROGRESS  3.  Pt will increase RUE strength to 4+/5 or greater to improve ability to use RUE when lifting or carrying items during meal preparation/housework/yardwork tasks.   Goal status: IN PROGRESS  4.  Pt will return to highest level of function using RUE as dominant during functional task completion.   Goal status: IN PROGRESS   ASSESSMENT:  CLINICAL IMPRESSION: This session pt reports increased stiffness and pain along D3-D5, which she feels she  is unable to bend independently. She presents with moderate fascial restrictions in the palmar aspect of her hand and wrist, as well as in the forearm, where OT completed manual to assist with relaxing and reducing those restrictions. Additionally, OT completed P/ROM on patients hand and wrist to start gentle stretches. Pt then worked on ROM where she demonstrated decreased ability to flex D4 or D5, as well as limited movement in the wrist, ~25% of full ROM. OT providing mod hands on assist throughout session, as well as verbal and tactile cuing for positioning and technique.   PERFORMANCE DEFICITS: in functional skills including ADLs, IADLs, coordination, dexterity, edema, ROM, strength, pain, fascial restrictions, Fine motor control, Gross motor control, body mechanics, and UE functional use.    PLAN:  OT FREQUENCY: 1-2x/week  OT DURATION: 6 weeks  PLANNED INTERVENTIONS: self care/ADL training, therapeutic exercise, therapeutic activity, manual therapy, scar mobilization, passive range of motion, functional mobility training, electrical stimulation, ultrasound, paraffin, moist heat, cryotherapy, patient/family education, coping strategies training, and DME and/or AE instructions  RECOMMENDED OTHER SERVICES: N/A  CONSULTED AND AGREED WITH PLAN OF CARE: Patient  PLAN FOR NEXT SESSION: Manual therapy, P/ROM, A/ROM, theraputty exercises, Digit ROM   Trish Mage, OTR/L [facility] [phone] Clariece Roesler Rosemarie Beath, OT 10/31/2022, 1:05 PM

## 2022-11-14 ENCOUNTER — Ambulatory Visit (HOSPITAL_COMMUNITY): Payer: Medicare Other | Admitting: Occupational Therapy

## 2022-11-14 ENCOUNTER — Encounter (HOSPITAL_COMMUNITY): Payer: Self-pay | Admitting: Occupational Therapy

## 2022-11-14 DIAGNOSIS — M79642 Pain in left hand: Secondary | ICD-10-CM

## 2022-11-14 DIAGNOSIS — M25532 Pain in left wrist: Secondary | ICD-10-CM | POA: Diagnosis not present

## 2022-11-14 DIAGNOSIS — M25632 Stiffness of left wrist, not elsewhere classified: Secondary | ICD-10-CM

## 2022-11-14 DIAGNOSIS — R29898 Other symptoms and signs involving the musculoskeletal system: Secondary | ICD-10-CM

## 2022-11-14 NOTE — Therapy (Signed)
OUTPATIENT OCCUPATIONAL THERAPY ORTHO TREATMENT NOTE  Patient Name: Allison Dickerson MRN: [id number] DOB:1955-08-12, 67 y.o., female Today's Date: 11/14/2022  PCP: Anabel Halon, MD REFERRING PROVIDER: Dairl Ponder, MD  END OF SESSION:  OT End of Session - 11/14/22 1527     Visit Number 3    Number of Visits 9    Date for OT Re-Evaluation 11/29/22    Authorization Type Medicare Part A and B    OT Start Time 1304    OT Stop Time 1348    OT Time Calculation (min) 44 min    Activity Tolerance Patient tolerated treatment well    Behavior During Therapy [facility] for tasks assessed/performed             Past Medical History:  Diagnosis Date   Anxiety    Arthritis    Phreesia 06/24/2020   Cervical cancer (HCC)    When younger.   Diverticulitis    Pinched nerve    right hand   Past Surgical History:  Procedure Laterality Date   ABDOMINAL HYSTERECTOMY     BIOPSY  10/24/2020   Procedure: BIOPSY;  Surgeon: Marguerita Merles, Reuel Boom, MD;  [facility];  Service: Gastroenterology;;   cervical cancer removal     COLONOSCOPY WITH PROPOFOL N/A 10/24/2020   Procedure: COLONOSCOPY WITH PROPOFOL;  Surgeon: Dolores Frame, MD;  [facility];  Service: Gastroenterology;  Laterality: N/A;  9:00 am   ESOPHAGOGASTRODUODENOSCOPY (EGD) WITH PROPOFOL N/A 10/24/2020   Procedure: ESOPHAGOGASTRODUODENOSCOPY (EGD) WITH PROPOFOL;  Surgeon: Dolores Frame, MD;  [facility];  Service: Gastroenterology;  Laterality: N/A;   EYE SURGERY N/A    Phreesia 06/24/2020   POLYPECTOMY  10/24/2020   Procedure: POLYPECTOMY;  Surgeon: Marguerita Merles, Reuel Boom, MD;  [facility];  Service: Gastroenterology;;   Patient Active Problem List   Diagnosis Date Noted   Extensor tendon rupture of hand, right, subsequent encounter 10/22/2022   Ganglion cyst of dorsum of right wrist 07/25/2022   Chronic prescription benzodiazepine use 07/24/2021    Benzodiazepine dependence (HCC) 05/29/2021   Urethral caruncle 11/29/2020   Lichen sclerosus 11/29/2020   Vaginal atrophy 11/29/2020   IBS (irritable bowel syndrome) 10/05/2020   Prediabetes 09/25/2020   Vitamin D deficiency 09/25/2020   Mixed hyperlipidemia 09/25/2020   Pulmonary nodule 06/28/2020   Bilateral carpal tunnel syndrome 06/27/2020   Lesion of ulnar nerve 06/27/2020   Polyarthropathy 06/27/2020   GERD (gastroesophageal reflux disease) 06/27/2020   OAB (overactive bladder) 05/08/2020   GAD (generalized anxiety disorder) 02/18/2017    ONSET DATE: 08/28/22  REFERRING DIAG: R Ganglion Cyst removal, B/L Carpal Tunnel Syndrome  THERAPY DIAG:  Pain in left wrist  Pain in left hand  Stiffness of left wrist, not elsewhere classified  Other symptoms and signs involving the musculoskeletal system  Rationale for Evaluation and Treatment: Rehabilitation  SUBJECTIVE:   SUBJECTIVE STATEMENT: "Will I ever be able to bend my fingers?" Pt accompanied by: self  PERTINENT HISTORY: Pt is a 67 yo right hand dominant female with a gradual onset of R CTS for 10 years and then she noticed inability to extend her right ring and small fingers in November 2023. Pt is s/p right carpal tunnel release with right small and ring finger extensor reconstructions with tendon transfers and right distal ulnar resection with ECU tendon transfer. Pt was fitted for a custom fabricated right FAB ulnar gutter orthosis extending to the full digits 3-5.   PRECAUTIONS: Other:  Hand, Ulnar Gutter splint  WEIGHT BEARING RESTRICTIONS: Yes >1lbs  PAIN:  Are you having pain? No  FALLS: Has patient fallen in last 6 months? No  PATIENT GOALS: To be able to grip again  NEXT MD VISIT: None  OBJECTIVE:   HAND DOMINANCE: Right  ADLs: Overall ADLs: Pt having increased difficulty with all small objects, her grip is poor and she is unable to hold on to more than a small glass of water. Limited mobility to  manipulate medium sized objects. Husband does all IADL's for patient.   FUNCTIONAL OUTCOME MEASURES: Quick Dash: 70.45  UPPER EXTREMITY ROM:     Active ROM Right eval  Wrist flexion   Wrist extension   Wrist ulnar deviation   Wrist radial deviation   Wrist pronation   Wrist supination   (Blank rows = not tested)  Active ROM Right eval  Thumb MCP (0-60) 44  Thumb IP (0-80) 60  Index MCP (0-90) 46  Index PIP (0-100) 60  Index DIP (0-70)  30  Long MCP (0-90)  50  Long PIP (0-100)  16  Long DIP (0-70)  24  Ring MCP (0-90) 30   Ring PIP (0-100)  20  Ring DIP (0-70)  0  Little MCP (0-90) 24   Little PIP (0-100)  14  Little DIP (0-70)  6  (Blank rows = not tested)   UPPER EXTREMITY MMT:     MMT Right eval  Wrist flexion   Wrist extension   Wrist ulnar deviation   Wrist radial deviation   Wrist pronation   Wrist supination   (Blank rows = not tested)  HAND FUNCTION: Grip strength: Right: 8 lbs; Left: 46 lbs, Lateral pinch: Right: 3 lbs, Left: 13 lbs, and 3 point pinch: Right: 1 lbs, Left: 12 lbs  COORDINATION: 9 Hole Peg test: Right: -- sec; Left: -- sec  SENSATION: Light touch: Impaired   EDEMA: mild swelling noted in palm and finger   OBSERVATIONS: min to mod Fascial restrictions and scar adhesions noted along the extensor and flexor aspects of wrist.    TODAY'S TREATMENT:                                                                                                                              DATE:   11/14/22 -P/ROM: wrist flexion/extension, supination/pronation, digit composite flexion, x10 with 5-10 second holds in end ranges -Weighted stretches: wrist flexion, wrist extension, 4x20" -Wrist ROM: flexion, extension, ulnar/radial deviation, supination/pronation, x15 -Theraputty: yellow, roll into ball, flatten into pancake, gripping PVC pipe, cutting "cookies" using wrist flexion and extension -Provided flexion glove to work on digit composite  flexion  10/31/22 -Manual Therapy: myofascial release and trigger point applied to the flexor and extensor bundles of the forearm, as well as wrist, in order to decrease fascial restrictions and pain to improve ROM -P/ROM: wrist flexion/extension, joint blocking and flexing each joint -Towel Crunches x5 -A/ROM: flexion, extension, ulnar/radial deviation,  supination/pronation, x10 -Digit ROM: composite flexion, extension, opposition, abduction/adduction, x10   PATIENT EDUCATION: Education details: Theraputty and flexion glove Person educated: Patient Education method: Explanation, Demonstration, and Handouts Education comprehension: verbalized understanding and returned demonstration  HOME EXERCISE PROGRAM: 4/30: Digit ROM and Wrist ROM 5/30: Theraputty and Flexion Glove  GOALS: Goals reviewed with patient? Yes  SHORT TERM GOALS: Target date: 11/08/22  Pt will be provided with and educated on HEP to improve mobility in RUE required for use during ADL completion.  Goal status: IN PROGRESS  2.   Pt will increase right wrist strength to 3+/5 to improve ability to reach for items using right hand and maintain a light grasp.   Goal status: IN PROGRESS  3.  Pt will increase right grip strength by 5# and pinch strength by 2# to improve ability to grasp and maintain hold on tools for ADL completion such as a cup, toothbrush, clothing, etc.   Goal status: IN PROGRESS  4.  Pt will decrease pain in RUE to 3/10 or less to improve ability to sleep for 2+ consecutive hours without waking due to pain.    Goal status: IN PROGRESS  LONG TERM GOALS: Target date: 11/29/22  Pt will decrease RUE fascial restrictions to min amounts or less to improve mobility required for functional reaching tasks.   Goal status: IN PROGRESS  2.  Pt will increase RUE A/ROM by at least 25 degrees to improve ability to use RUE when reaching overhead or behind back during dressing and bathing tasks.   Goal  status: IN PROGRESS  3.  Pt will increase RUE strength to 4+/5 or greater to improve ability to use RUE when lifting or carrying items during meal preparation/housework/yardwork tasks.   Goal status: IN PROGRESS  4.  Pt will return to highest level of function using RUE as dominant during functional task completion.   Goal status: IN PROGRESS   ASSESSMENT:  CLINICAL IMPRESSION: This session pt continuing to have stiffness in all of her digits. With increased P/ROM for stretching and weighted wrist stretches, she was able to further flex each finger towards the end of the session. OT initiated pt on theraputty this session, where she required increased time due to weakness and fatigue, however she was able to complete the entire task. Additionally OT provided pt with a flexion glove to improve her digit flexion at home. Verbal, tactile, and visual cuing provided throughout session for positioning and technique.   PERFORMANCE DEFICITS: in functional skills including ADLs, IADLs, coordination, dexterity, edema, ROM, strength, pain, fascial restrictions, Fine motor control, Gross motor control, body mechanics, and UE functional use.    PLAN:  OT FREQUENCY: 1-2x/week  OT DURATION: 6 weeks  PLANNED INTERVENTIONS: self care/ADL training, therapeutic exercise, therapeutic activity, manual therapy, scar mobilization, passive range of motion, functional mobility training, electrical stimulation, ultrasound, paraffin, moist heat, cryotherapy, patient/family education, coping strategies training, and DME and/or AE instructions  RECOMMENDED OTHER SERVICES: N/A  CONSULTED AND AGREED WITH PLAN OF CARE: Patient  PLAN FOR NEXT SESSION: Manual therapy, P/ROM, A/ROM, theraputty exercises, Digit ROM   Trish Mage, OTR/L [facility] [phone] Kennyth Arnold, OT 11/14/2022, 3:29 PM

## 2022-12-05 ENCOUNTER — Encounter (HOSPITAL_COMMUNITY): Payer: Self-pay | Admitting: Occupational Therapy

## 2022-12-05 ENCOUNTER — Ambulatory Visit (HOSPITAL_COMMUNITY): Payer: Medicare Other | Attending: Orthopedic Surgery | Admitting: Occupational Therapy

## 2022-12-05 DIAGNOSIS — M79642 Pain in left hand: Secondary | ICD-10-CM | POA: Insufficient documentation

## 2022-12-05 DIAGNOSIS — M25532 Pain in left wrist: Secondary | ICD-10-CM | POA: Diagnosis present

## 2022-12-05 DIAGNOSIS — M25632 Stiffness of left wrist, not elsewhere classified: Secondary | ICD-10-CM | POA: Diagnosis present

## 2022-12-05 DIAGNOSIS — R29898 Other symptoms and signs involving the musculoskeletal system: Secondary | ICD-10-CM | POA: Diagnosis present

## 2022-12-05 NOTE — Therapy (Signed)
OUTPATIENT OCCUPATIONAL THERAPY ORTHO TREATMENT NOTE  Patient Name: Allison Dickerson MRN: [id number] DOB:Jul 20, 1955, 67 y.o., female Today's Date: 12/05/2022  PCP: Anabel Halon, MD REFERRING PROVIDER: Dairl Ponder, MD  END OF SESSION:  OT End of Session - 12/05/22 1514     Visit Number 4    Number of Visits 9    Date for OT Re-Evaluation 11/29/22    Authorization Type Medicare Part A and B    OT Start Time 1306    OT Stop Time 1356    OT Time Calculation (min) 50 min    Activity Tolerance Patient tolerated treatment well    Behavior During Therapy [facility] for tasks assessed/performed             Past Medical History:  Diagnosis Date   Anxiety    Arthritis    Phreesia 06/24/2020   Cervical cancer (HCC)    When younger.   Diverticulitis    Pinched nerve    right hand   Past Surgical History:  Procedure Laterality Date   ABDOMINAL HYSTERECTOMY     BIOPSY  10/24/2020   Procedure: BIOPSY;  Surgeon: Marguerita Merles, Reuel Boom, MD;  [facility];  Service: Gastroenterology;;   cervical cancer removal     COLONOSCOPY WITH PROPOFOL N/A 10/24/2020   Procedure: COLONOSCOPY WITH PROPOFOL;  Surgeon: Dolores Frame, MD;  [facility];  Service: Gastroenterology;  Laterality: N/A;  9:00 am   ESOPHAGOGASTRODUODENOSCOPY (EGD) WITH PROPOFOL N/A 10/24/2020   Procedure: ESOPHAGOGASTRODUODENOSCOPY (EGD) WITH PROPOFOL;  Surgeon: Dolores Frame, MD;  [facility];  Service: Gastroenterology;  Laterality: N/A;   EYE SURGERY N/A    Phreesia 06/24/2020   POLYPECTOMY  10/24/2020   Procedure: POLYPECTOMY;  Surgeon: Marguerita Merles, Reuel Boom, MD;  [facility];  Service: Gastroenterology;;   Patient Active Problem List   Diagnosis Date Noted   Extensor tendon rupture of hand, right, subsequent encounter 10/22/2022   Ganglion cyst of dorsum of right wrist 07/25/2022   Chronic prescription benzodiazepine use 07/24/2021    Benzodiazepine dependence (HCC) 05/29/2021   Urethral caruncle 11/29/2020   Lichen sclerosus 11/29/2020   Vaginal atrophy 11/29/2020   IBS (irritable bowel syndrome) 10/05/2020   Prediabetes 09/25/2020   Vitamin D deficiency 09/25/2020   Mixed hyperlipidemia 09/25/2020   Pulmonary nodule 06/28/2020   Bilateral carpal tunnel syndrome 06/27/2020   Lesion of ulnar nerve 06/27/2020   Polyarthropathy 06/27/2020   GERD (gastroesophageal reflux disease) 06/27/2020   OAB (overactive bladder) 05/08/2020   GAD (generalized anxiety disorder) 02/18/2017    ONSET DATE: 08/28/22  REFERRING DIAG: R Ganglion Cyst removal, B/L Carpal Tunnel Syndrome  THERAPY DIAG:  Pain in left wrist  Pain in left hand  Stiffness of left wrist, not elsewhere classified  Other symptoms and signs involving the musculoskeletal system  Rationale for Evaluation and Treatment: Rehabilitation  SUBJECTIVE:   SUBJECTIVE STATEMENT: "Today is a rough day" Pt accompanied by: self  PERTINENT HISTORY: Pt is a 67 yo right hand dominant female with a gradual onset of R CTS for 10 years and then she noticed inability to extend her right ring and small fingers in November 2023. Pt is s/p right carpal tunnel release with right small and ring finger extensor reconstructions with tendon transfers and right distal ulnar resection with ECU tendon transfer. Pt was fitted for a custom fabricated right FAB ulnar gutter orthosis extending to the full digits 3-5.   PRECAUTIONS: Other: Hand, Ulnar Gutter splint  WEIGHT BEARING RESTRICTIONS: Yes >1lbs  PAIN:  Are you having pain? No  FALLS: Has patient fallen in last 6 months? No  PATIENT GOALS: To be able to grip again  NEXT MD VISIT: None  OBJECTIVE:   HAND DOMINANCE: Right  ADLs: Overall ADLs: Pt having increased difficulty with all small objects, her grip is poor and she is unable to hold on to more than a small glass of water. Limited mobility to manipulate medium  sized objects. Husband does all IADL's for patient.   FUNCTIONAL OUTCOME MEASURES: Quick Dash: 70.45  UPPER EXTREMITY ROM:     Active ROM Right eval  Wrist flexion   Wrist extension   Wrist ulnar deviation   Wrist radial deviation   Wrist pronation   Wrist supination   (Blank rows = not tested)  Active ROM Right eval  Thumb MCP (0-60) 44  Thumb IP (0-80) 60  Index MCP (0-90) 46  Index PIP (0-100) 60  Index DIP (0-70)  30  Long MCP (0-90)  50  Long PIP (0-100)  16  Long DIP (0-70)  24  Ring MCP (0-90) 30   Ring PIP (0-100)  20  Ring DIP (0-70)  0  Little MCP (0-90) 24   Little PIP (0-100)  14  Little DIP (0-70)  6  (Blank rows = not tested)   UPPER EXTREMITY MMT:     MMT Right eval  Wrist flexion   Wrist extension   Wrist ulnar deviation   Wrist radial deviation   Wrist pronation   Wrist supination   (Blank rows = not tested)  HAND FUNCTION: Grip strength: Right: 8 lbs; Left: 46 lbs, Lateral pinch: Right: 3 lbs, Left: 13 lbs, and 3 point pinch: Right: 1 lbs, Left: 12 lbs  COORDINATION: 9 Hole Peg test: Right: -- sec; Left: -- sec  SENSATION: Light touch: Impaired   EDEMA: mild swelling noted in palm and finger   OBSERVATIONS: min to mod Fascial restrictions and scar adhesions noted along the extensor and flexor aspects of wrist.    TODAY'S TREATMENT:                                                                                                                              DATE:   12/05/22 -P/ROM: Wrist flexion, extension, ulnar/radial deviation, supination/pronation, x10 -Wrist A/ROM: flexion, extension, ulnar/radial deviation, supination/pronation, x10 -Digit ROM: composite flexion, opposition, finger taps, abduction/adduction, x10 -Stretching: OT stretching digits into flexion with 10" holds x10 -Digiflex: 3lb, full squeeze x10 -Pinch Strengthening: yellow (6), red(7), green(8), and blue (3) resistance clips, tripod pinch up and modified lateral  pinch down -Paraffin and Moist Heat for 8 mins  11/14/22 -P/ROM: wrist flexion/extension, supination/pronation, digit composite flexion, x10 with 5-10 second holds in end ranges -Weighted stretches: wrist flexion, wrist extension, 4x20" -Wrist ROM: flexion, extension, ulnar/radial deviation, supination/pronation, x15 -Theraputty: yellow, roll into ball, flatten into pancake, gripping PVC pipe, cutting "cookies" using wrist  flexion and extension -Provided flexion glove to work on digit composite flexion  10/31/22 -Manual Therapy: myofascial release and trigger point applied to the flexor and extensor bundles of the forearm, as well as wrist, in order to decrease fascial restrictions and pain to improve ROM -P/ROM: wrist flexion/extension, joint blocking and flexing each joint -Towel Crunches x5 -A/ROM: flexion, extension, ulnar/radial deviation, supination/pronation, x10 -Digit ROM: composite flexion, extension, opposition, abduction/adduction, x10   PATIENT EDUCATION: Education details: Educational psychologist Person educated: Patient Education method: Programmer, multimedia, Demonstration, and Handouts Education comprehension: verbalized understanding and returned demonstration  HOME EXERCISE PROGRAM: 4/30: Digit ROM and Wrist ROM 5/30: Theraputty and Flexion Glove 6/20: Self Stretches  GOALS: Goals reviewed with patient? Yes  SHORT TERM GOALS: Target date: 11/08/22  Pt will be provided with and educated on HEP to improve mobility in RUE required for use during ADL completion.  Goal status: IN PROGRESS  2.   Pt will increase right wrist strength to 3+/5 to improve ability to reach for items using right hand and maintain a light grasp.   Goal status: IN PROGRESS  3.  Pt will increase right grip strength by 5# and pinch strength by 2# to improve ability to grasp and maintain hold on tools for ADL completion such as a cup, toothbrush, clothing, etc.   Goal status: IN PROGRESS  4.  Pt will  decrease pain in RUE to 3/10 or less to improve ability to sleep for 2+ consecutive hours without waking due to pain.    Goal status: IN PROGRESS  LONG TERM GOALS: Target date: 11/29/22  Pt will decrease RUE fascial restrictions to min amounts or less to improve mobility required for functional reaching tasks.   Goal status: IN PROGRESS  2.  Pt will increase RUE A/ROM by at least 25 degrees to improve ability to use RUE when reaching overhead or behind back during dressing and bathing tasks.   Goal status: IN PROGRESS  3.  Pt will increase RUE strength to 4+/5 or greater to improve ability to use RUE when lifting or carrying items during meal preparation/housework/yardwork tasks.   Goal status: IN PROGRESS  4.  Pt will return to highest level of function using RUE as dominant during functional task completion.   Goal status: IN PROGRESS   ASSESSMENT:  CLINICAL IMPRESSION: Pt presented to therapy session with complaints of vertigo symptoms and increased stiffness in her hand and wrist. OT provided increased hands on stretching/manipulation to improve mobility, then had pt working on her active movements. As pt appeared more relaxed, she was able to work on gripping and pinching items to improve her hand strength. OT ended session with paraffin bath and moist heat to assist pt with her increased pain and muscle tightness. Verbal and visual cuing provided for positioning and technique.   PERFORMANCE DEFICITS: in functional skills including ADLs, IADLs, coordination, dexterity, edema, ROM, strength, pain, fascial restrictions, Fine motor control, Gross motor control, body mechanics, and UE functional use.    PLAN:  OT FREQUENCY: 1-2x/week  OT DURATION: 6 weeks  PLANNED INTERVENTIONS: self care/ADL training, therapeutic exercise, therapeutic activity, manual therapy, scar mobilization, passive range of motion, functional mobility training, electrical stimulation, ultrasound, paraffin,  moist heat, cryotherapy, patient/family education, coping strategies training, and DME and/or AE instructions  RECOMMENDED OTHER SERVICES: N/A  CONSULTED AND AGREED WITH PLAN OF CARE: Patient  PLAN FOR NEXT SESSION: Manual therapy, P/ROM, A/ROM, theraputty exercises, Digit ROM   Hallis Meditz Kathlen Mody [facility] [phone]  Sinclair Alligood Rosemarie Beath, OT 12/05/2022, 3:17 PM

## 2022-12-17 ENCOUNTER — Encounter (HOSPITAL_COMMUNITY): Payer: Self-pay | Admitting: Occupational Therapy

## 2023-01-02 ENCOUNTER — Encounter (HOSPITAL_COMMUNITY): Payer: Self-pay | Admitting: Occupational Therapy

## 2023-01-22 ENCOUNTER — Ambulatory Visit (INDEPENDENT_AMBULATORY_CARE_PROVIDER_SITE_OTHER): Payer: Medicare Other | Admitting: Internal Medicine

## 2023-01-22 ENCOUNTER — Encounter: Payer: Self-pay | Admitting: Internal Medicine

## 2023-01-22 VITALS — BP 111/73 | HR 86 | Ht 67.0 in | Wt 111.0 lb

## 2023-01-22 DIAGNOSIS — F411 Generalized anxiety disorder: Secondary | ICD-10-CM | POA: Diagnosis not present

## 2023-01-22 DIAGNOSIS — M67431 Ganglion, right wrist: Secondary | ICD-10-CM | POA: Diagnosis not present

## 2023-01-22 DIAGNOSIS — K580 Irritable bowel syndrome with diarrhea: Secondary | ICD-10-CM

## 2023-01-22 DIAGNOSIS — Z79899 Other long term (current) drug therapy: Secondary | ICD-10-CM

## 2023-01-22 DIAGNOSIS — E782 Mixed hyperlipidemia: Secondary | ICD-10-CM

## 2023-01-22 MED ORDER — LORAZEPAM 1 MG PO TABS: 1.0000 mg | ORAL_TABLET | Freq: Three times a day (TID) | ORAL | 2 refills | Status: DC | PRN

## 2023-01-22 NOTE — Assessment & Plan Note (Signed)
Lipid profile reviewed Advised to follow low cholesterol diet for now

## 2023-01-22 NOTE — Assessment & Plan Note (Signed)
Now resolved Has tried Bentyl and Levsin, did not provide relief Takes ginger now

## 2023-01-22 NOTE — Patient Instructions (Addendum)
Please continue to take medications as prescribed.  Please consider getting Shingrix and Tdap vaccine at local pharmacy.

## 2023-01-22 NOTE — Assessment & Plan Note (Addendum)
For severe GAD Check Toxassure in the next visit

## 2023-01-22 NOTE — Assessment & Plan Note (Signed)
Recurrent Also has pain in wrist and forearm Followed by Hand surgeon - s/p removal of ganglion cyst

## 2023-01-22 NOTE — Progress Notes (Signed)
Established Patient Office Visit  Subjective:  Patient ID: Allison Dickerson, female    DOB: 1956-01-09  Age: 67 y.o. MRN: [id number]  CC:  Chief Complaint  Patient presents with   Anxiety    Three months follow up     HPI Allison Dickerson is a 67 y.o. female with past medical history of GERD, polyarthritis, severe anxiety and pulmonary nodule who presents for f/u of her chronic medical conditions.  GAD: She takes Ativan 1 mg 3 times daily for anxiety.  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 She is undergoing dental work-up for implants as well.  She has chronic right hand pain, but has noticed improvement now since surgery.  She had a ganglion cyst on the right hand, for which she saw Hand specialist.  She also has bilateral carpal tunnel syndrome. She is status post right carpal tunnel release with right small and ring finger extensor reconstructions with tendon transfers and right distal ulnar resection with ECU tendon transfer. She underwent this surgery by Dr. Dairl Ponder on 08/28/2022.  She wants to talk about her preventive tests in the next visit.   Past Medical History:  Diagnosis Date   Anxiety    Arthritis    Phreesia 06/24/2020   Cervical cancer (HCC)    When younger.   Diverticulitis    Pinched nerve    right hand    Past Surgical History:  Procedure Laterality Date   ABDOMINAL HYSTERECTOMY     BIOPSY  10/24/2020   Procedure: BIOPSY;  Surgeon: Marguerita Merles, Reuel Boom, MD;  [facility];  Service: Gastroenterology;;   cervical cancer removal     COLONOSCOPY WITH PROPOFOL N/A 10/24/2020   Procedure: COLONOSCOPY WITH PROPOFOL;  Surgeon: Dolores Frame, MD;  [facility];  Service: Gastroenterology;  Laterality: N/A;  9:00 am   ESOPHAGOGASTRODUODENOSCOPY (EGD) WITH PROPOFOL N/A 10/24/2020   Procedure: ESOPHAGOGASTRODUODENOSCOPY (EGD) WITH  PROPOFOL;  Surgeon: Dolores Frame, MD;  [facility];  Service: Gastroenterology;  Laterality: N/A;   EYE SURGERY N/A    Phreesia 06/24/2020   POLYPECTOMY  10/24/2020   Procedure: POLYPECTOMY;  Surgeon: Marguerita Merles, Reuel Boom, MD;  [facility];  Service: Gastroenterology;;    Family History  Problem Relation Age of Onset   Cancer Father    Alcohol abuse Sister    Cancer Other     Social History   Socioeconomic History   Marital status: Married    Spouse name: Not on file   Number of children: Not on file   Years of education: Not on file   Highest education level: Associate degree: occupational, Scientist, product/process development, or vocational program  Occupational History   Occupation: retired  Tobacco Use   Smoking status: Former    Current packs/day: 0.50    Average packs/day: 0.5 packs/day for 20.0 years (10.0 ttl pk-yrs)    Types: Cigarettes   Smokeless tobacco: Never  Vaping Use   Vaping status: Never Used  Substance and Sexual Activity   Alcohol use: No   Drug use: Yes    Types: Marijuana    Comment: occ with gummies   Sexual activity: Not Currently    Birth control/protection: Surgical    Comment: hyst  Other Topics Concern   Not on file  Social History Narrative   Not on file   Social Determinants of Corporate investment banker  Strain: Low Risk  (10/22/2022)   Overall Financial Resource Strain (CARDIA)    Difficulty of Paying Living Expenses: Not very hard  Food Insecurity: No Food Insecurity (10/22/2022)   Hunger Vital Sign    Worried About Running Out of Food in the Last Year: Never true    Ran Out of Food in the Last Year: Never true  Transportation Needs: No Transportation Needs (10/22/2022)   PRAPARE - Administrator, Civil Service (Medical): No    Lack of Transportation (Non-Medical): No  Physical Activity: Inactive (10/22/2022)   Exercise Vital Sign    Days of Exercise per Week: 0 days    Minutes of Exercise per Session: 0  min  Stress: No Stress Concern Present (10/22/2022)   [employer] of Occupational Health - Occupational Stress Questionnaire    Feeling of Stress : Only a little  Social Connections: Socially Isolated (10/22/2022)   Social Connection and Isolation Panel [NHANES]    Frequency of Communication with Friends and Family: Once a week    Frequency of Social Gatherings with Friends and Family: Once a week    Attends Religious Services: Never    Database administrator or Organizations: No    Attends Banker Meetings: Never    Marital Status: Married  Catering manager Violence: Not At Risk (09/02/2022)   Humiliation, Afraid, Rape, and Kick questionnaire    Fear of Current or Ex-Partner: No    Emotionally Abused: No    Physically Abused: No    Sexually Abused: No    Outpatient Medications Prior to Visit  Medication Sig Dispense Refill   ibuprofen (ADVIL) 800 MG tablet Take 800 mg by mouth.     meclizine (ANTIVERT) 12.5 MG tablet Take 1 tablet (12.5 mg total) by mouth 3 (three) times daily as needed for dizziness. 30 tablet 0   Multiple Vitamin (MULTIVITAMIN WITH MINERALS) TABS tablet Take 1 tablet by mouth daily.     ondansetron (ZOFRAN-ODT) 4 MG disintegrating tablet Take 1 tablet (4 mg total) by mouth every 8 (eight) hours as needed for nausea or vomiting. 20 tablet 0   LORazepam (ATIVAN) 1 MG tablet Take 1 tablet (1 mg total) by mouth every 8 (eight) hours as needed for anxiety. 90 tablet 2   No facility-administered medications prior to visit.    No Known Allergies  ROS Review of Systems  Constitutional:  Negative for chills and fever.  HENT:  Negative for congestion, sinus pressure, sinus pain and sore throat.   Eyes:  Negative for pain and discharge.  Respiratory:  Negative for cough and shortness of breath.   Cardiovascular:  Negative for chest pain and palpitations.  Gastrointestinal:  Negative for abdominal pain, constipation, nausea and vomiting.  Endocrine:  Negative for polydipsia and polyuria.  Genitourinary:  Negative for dysuria and hematuria.  Musculoskeletal:  Positive for arthralgias (Right wrist). Negative for neck pain and neck stiffness.  Skin:  Negative for rash.  Neurological:  Positive for numbness (Right hand). Negative for dizziness, speech difficulty and weakness.  Psychiatric/Behavioral:  Positive for sleep disturbance. Negative for agitation, behavioral problems, dysphoric mood and suicidal ideas. The patient is nervous/anxious.       Objective:    Physical Exam Vitals reviewed.  Constitutional:      General: She is not in acute distress.    Appearance: She is not diaphoretic.  HENT:     Head: Normocephalic and atraumatic.     Nose: Nose normal.  Mouth/Throat:     Mouth: Mucous membranes are moist.  Eyes:     General: No scleral icterus.    Extraocular Movements: Extraocular movements intact.  Cardiovascular:     Rate and Rhythm: Normal rate and regular rhythm.     Pulses: Normal pulses.     Heart sounds: Normal heart sounds. No murmur heard. Pulmonary:     Breath sounds: Normal breath sounds. No wheezing or rales.  Musculoskeletal:     Cervical back: Neck supple. No tenderness.     Right lower leg: No edema.     Left lower leg: No edema.  Skin:    General: Skin is warm.     Findings: No rash.  Neurological:     General: No focal deficit present.     Mental Status: She is alert and oriented to person, place, and time.     Sensory: No sensory deficit.     Motor: No weakness.  Psychiatric:        Mood and Affect: Mood normal.        Behavior: Behavior normal.     BP 111/73 (BP Location: Right Arm, Patient Position: Sitting, Cuff Size: Normal)   Pulse 86   Ht 5\' 7"  (1.702 m)   Wt 111 lb (50.3 kg)   SpO2 98%   BMI 17.39 kg/m  Wt Readings from Last 3 Encounters:  01/22/23 111 lb (50.3 kg)  10/22/22 113 lb 3.2 oz (51.3 kg)  07/25/22 113 lb 12.8 oz (51.6 kg)    Lab Results  Component Value Date    TSH 1.470 10/22/2022   Lab Results  Component Value Date   WBC 7.2 10/22/2022   HGB 12.6 10/22/2022   HCT 36.5 10/22/2022   MCV 92 10/22/2022   PLT 258 10/22/2022   Lab Results  Component Value Date   NA 141 10/22/2022   K 4.6 10/22/2022   CO2 23 10/22/2022   GLUCOSE 97 10/22/2022   BUN 24 10/22/2022   CREATININE 0.91 10/22/2022   BILITOT 0.5 10/22/2022   ALKPHOS 72 10/22/2022   AST 23 10/22/2022   ALT 26 10/22/2022   PROT 6.9 10/22/2022   ALBUMIN 4.4 10/22/2022   CALCIUM 9.9 10/22/2022   ANIONGAP 5 08/12/2022   EGFR 69 10/22/2022   Lab Results  Component Value Date   CHOL 223 (H) 10/22/2022   Lab Results  Component Value Date   HDL 82 10/22/2022   Lab Results  Component Value Date   LDLCALC 129 (H) 10/22/2022   Lab Results  Component Value Date   TRIG 70 10/22/2022   Lab Results  Component Value Date   CHOLHDL 2.7 10/22/2022   Lab Results  Component Value Date   HGBA1C 5.7 (H) 10/22/2022      Assessment & Plan:   Problem List Items Addressed This Visit       Digestive   IBS (irritable bowel syndrome)    Now resolved Has tried Bentyl and Levsin, did not provide relief Takes ginger now        Other   GAD (generalized anxiety disorder)    Well-controlled Had caregiver stress as well, has been stressed due to loss of her mother and her right hand pain On Lorazepam 1 mg TID, has been on Benzodiazepine for more than 15 years Did not tolerate Wellbutrin in the past with her previous PCP Did not like Buspar      Relevant Medications   LORazepam (ATIVAN) 1 MG tablet (Start on 02/10/2023)  Mixed hyperlipidemia    Lipid profile reviewed Advised to follow low cholesterol diet for now      Chronic prescription benzodiazepine use - Primary    For severe GAD Check Toxassure in the next visit      Ganglion cyst of dorsum of right wrist    Recurrent Also has pain in wrist and forearm Followed by Hand surgeon - s/p removal of ganglion  cyst       Meds ordered this encounter  Medications   LORazepam (ATIVAN) 1 MG tablet    Sig: Take 1 tablet (1 mg total) by mouth every 8 (eight) hours as needed for anxiety.    Dispense:  90 tablet    Refill:  2    Follow-up: Return in about 3 months (around 04/24/2023) for GAD.    Anabel Halon, MD

## 2023-01-22 NOTE — Assessment & Plan Note (Signed)
Well-controlled Had caregiver stress as well, has been stressed due to loss of her mother and her right hand pain On Lorazepam 1 mg TID, has been on Benzodiazepine for more than 15 years Did not tolerate Wellbutrin in the past with her previous PCP Did not like Buspar

## 2023-02-13 ENCOUNTER — Telehealth: Payer: Self-pay | Admitting: Urology

## 2023-02-13 NOTE — Telephone Encounter (Signed)
Patient called she is going to drop off a urine 02/14/23, she is having a burning sensation when urinating.  She said the Dr Ronne Binning just has her drop of urine sample and he calls her in something.

## 2023-02-14 ENCOUNTER — Ambulatory Visit (INDEPENDENT_AMBULATORY_CARE_PROVIDER_SITE_OTHER): Payer: Medicare Other

## 2023-02-14 ENCOUNTER — Other Ambulatory Visit: Payer: Medicare Other

## 2023-02-14 DIAGNOSIS — N390 Urinary tract infection, site not specified: Secondary | ICD-10-CM

## 2023-02-14 LAB — MICROSCOPIC EXAMINATION: WBC, UA: >30 /hpf — AB (ref 0–5)

## 2023-02-14 LAB — URINALYSIS, ROUTINE W REFLEX MICROSCOPIC
Bilirubin, UA: NEGATIVE
Glucose, UA: NEGATIVE
Ketones, UA: NEGATIVE
Nitrite, UA: POSITIVE — AB
Specific Gravity, UA: 1.03 (ref 1.005–1.030)
Urobilinogen, Ur: 0.2 mg/dL (ref 0.2–1.0)
pH, UA: 6 (ref 5.0–7.5)

## 2023-02-14 MED ORDER — NITROFURANTOIN MONOHYD MACRO 100 MG PO CAPS: 100.0000 mg | ORAL_CAPSULE | Freq: Two times a day (BID) | ORAL | 0 refills | Status: DC

## 2023-02-14 NOTE — Progress Notes (Cosign Needed Addendum)
Patient presents today with complaints of  UTI.  UA and Culture done today.  Dr. Ronne Binning reviewed results and Macrobid BID.  Patient aware of MD recommendations and that we will reach out with culture results.      [id number], CMA

## 2023-02-19 LAB — URINE CULTURE

## 2023-04-22 ENCOUNTER — Ambulatory Visit (INDEPENDENT_AMBULATORY_CARE_PROVIDER_SITE_OTHER): Payer: Medicare Other | Admitting: Internal Medicine

## 2023-04-22 ENCOUNTER — Encounter: Payer: Self-pay | Admitting: Internal Medicine

## 2023-04-22 VITALS — BP 110/69 | HR 85 | Ht 67.0 in | Wt 112.2 lb

## 2023-04-22 DIAGNOSIS — Z79899 Other long term (current) drug therapy: Secondary | ICD-10-CM | POA: Diagnosis not present

## 2023-04-22 DIAGNOSIS — K219 Gastro-esophageal reflux disease without esophagitis: Secondary | ICD-10-CM

## 2023-04-22 DIAGNOSIS — E782 Mixed hyperlipidemia: Secondary | ICD-10-CM | POA: Diagnosis not present

## 2023-04-22 DIAGNOSIS — F1320 Sedative, hypnotic or anxiolytic dependence, uncomplicated: Secondary | ICD-10-CM

## 2023-04-22 DIAGNOSIS — F411 Generalized anxiety disorder: Secondary | ICD-10-CM | POA: Diagnosis not present

## 2023-04-22 DIAGNOSIS — G5603 Carpal tunnel syndrome, bilateral upper limbs: Secondary | ICD-10-CM

## 2023-04-22 MED ORDER — LORAZEPAM 1 MG PO TABS: 1.0000 mg | ORAL_TABLET | Freq: Three times a day (TID) | ORAL | 2 refills | Status: DC | PRN

## 2023-04-22 NOTE — Assessment & Plan Note (Signed)
Followed by Orthopedic surgeon Followed by hand specialist - s/p right sided carpal tunnel release surgery Has wrist splint in place

## 2023-04-22 NOTE — Assessment & Plan Note (Addendum)
Well-controlled Had caregiver stress as well, has been stressed due to loss of her mother and her right hand pain On Lorazepam 1 mg TID, has been on Benzodiazepine for more than 15 years - refilled, PDMP reviewed Did not tolerate Wellbutrin in the past with her previous PCP Did not like Buspar

## 2023-04-22 NOTE — Addendum Note (Signed)
Addended byTrena Platt on: 04/22/2023 01:49 PM   Modules accepted: Level of Service

## 2023-04-22 NOTE — Assessment & Plan Note (Signed)
Omeprazole as needed Advised to avoid oral NSAIDs 

## 2023-04-22 NOTE — Assessment & Plan Note (Signed)
Lipid profile reviewed Advised to follow low cholesterol diet for now

## 2023-04-22 NOTE — Patient Instructions (Signed)
Please continue to take medications as prescribed.  Please continue to follow low salt diet and perform moderate exercise/walking at least 150 mins/week. 

## 2023-04-22 NOTE — Progress Notes (Addendum)
Established Patient Office Visit  Subjective:  Patient ID: Allison Dickerson, female    DOB: [birth year]  Age: 67 y.o. MRN: [id number]  CC:  Chief Complaint  Patient presents with   Anxiety    Three month follow up     HPI Allison Dickerson is a 67 y.o. female with past medical history of GERD, polyarthritis, severe anxiety and pulmonary nodule who presents for f/u of her chronic medical conditions.  GAD: She takes Ativan 1 mg 3 times daily for anxiety.  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 Denies anhedonia, SI or HI currently.  She has chronic right hand pain, but has noticed improvement now since surgery.  She had a ganglion cyst on the right hand, for which she saw Hand specialist.  She also has bilateral carpal tunnel syndrome. She is status post right carpal tunnel release with right small and ring finger extensor reconstructions with tendon transfers and right distal ulnar resection with ECU tendon transfer. She underwent this surgery by Dr. Dairl Ponder on 08/28/2022.  She wants to talk about her preventive tests later, still denies Mammography, DEXA scan and vaccines.   Past Medical History:  Diagnosis Date   Anxiety    Arthritis    Phreesia 06/24/2020   Cervical cancer (HCC)    When younger.   Diverticulitis    Pinched nerve    right hand    Past Surgical History:  Procedure Laterality Date   ABDOMINAL HYSTERECTOMY     BIOPSY  10/24/2020   Procedure: BIOPSY;  Surgeon: Marguerita Merles, Reuel Boom, MD;  Location: [facility];  Service: Gastroenterology;;   cervical cancer removal     COLONOSCOPY WITH PROPOFOL N/A 10/24/2020   Procedure: COLONOSCOPY WITH PROPOFOL;  Surgeon: Dolores Frame, MD;  Location: [facility];  Service: Gastroenterology;  Laterality: N/A;  9:00 am   ESOPHAGOGASTRODUODENOSCOPY (EGD) WITH PROPOFOL N/A 10/24/2020   Procedure: ESOPHAGOGASTRODUODENOSCOPY  (EGD) WITH PROPOFOL;  Surgeon: Dolores Frame, MD;  Location: [facility];  Service: Gastroenterology;  Laterality: N/A;   EYE SURGERY N/A    Phreesia 06/24/2020   POLYPECTOMY  10/24/2020   Procedure: POLYPECTOMY;  Surgeon: Marguerita Merles, Reuel Boom, MD;  Location: [facility];  Service: Gastroenterology;;    Family History  Problem Relation Age of Onset   Cancer Father    Alcohol abuse Sister    Cancer Other     Social History   Socioeconomic History   Marital status: Married    Spouse name: Not on file   Number of children: Not on file   Years of education: Not on file   Highest education level: Associate degree: occupational, Scientist, product/process development, or vocational program  Occupational History   Occupation: retired  Tobacco Use   Smoking status: Former    Current packs/day: 0.50    Average packs/day: 0.5 packs/day for 20.0 years (10.0 ttl pk-yrs)    Types: Cigarettes   Smokeless tobacco: Never  Vaping Use   Vaping status: Never Used  Substance and Sexual Activity   Alcohol use: No   Drug use: Yes    Types: Marijuana    Comment: occ with gummies   Sexual activity: Not Currently    Birth control/protection: Surgical    Comment: hyst  Other Topics Concern   Not on file  Social History Narrative   Not on file   Social Determinants of Health   Financial  Resource Strain: Low Risk  (10/22/2022)   Overall Financial Resource Strain (CARDIA)    Difficulty of Paying Living Expenses: Not very hard  Food Insecurity: No Food Insecurity (10/22/2022)   Hunger Vital Sign    Worried About Running Out of Food in the Last Year: Never true    Ran Out of Food in the Last Year: Never true  Transportation Needs: No Transportation Needs (10/22/2022)   PRAPARE - Administrator, Civil Service (Medical): No    Lack of Transportation (Non-Medical): No  Physical Activity: Inactive (10/22/2022)   Exercise Vital Sign    Days of Exercise per Week: 0 days    Minutes of Exercise per  Session: 0 min  Stress: No Stress Concern Present (10/22/2022)   [employer] of Occupational Health - Occupational Stress Questionnaire    Feeling of Stress : Only a little  Social Connections: Socially Isolated (10/22/2022)   Social Connection and Isolation Panel [NHANES]    Frequency of Communication with Friends and Family: Once a week    Frequency of Social Gatherings with Friends and Family: Once a week    Attends Religious Services: Never    Database administrator or Organizations: No    Attends Banker Meetings: Never    Marital Status: Married  Catering manager Violence: Not At Risk (09/02/2022)   Humiliation, Afraid, Rape, and Kick questionnaire    Fear of Current or Ex-Partner: No    Emotionally Abused: No    Physically Abused: No    Sexually Abused: No    Outpatient Medications Prior to Visit  Medication Sig Dispense Refill   ibuprofen (ADVIL) 800 MG tablet Take 800 mg by mouth.     meclizine (ANTIVERT) 12.5 MG tablet Take 1 tablet (12.5 mg total) by mouth 3 (three) times daily as needed for dizziness. 30 tablet 0   Multiple Vitamin (MULTIVITAMIN WITH MINERALS) TABS tablet Take 1 tablet by mouth daily.     ondansetron (ZOFRAN-ODT) 4 MG disintegrating tablet Take 1 tablet (4 mg total) by mouth every 8 (eight) hours as needed for nausea or vomiting. 20 tablet 0   LORazepam (ATIVAN) 1 MG tablet Take 1 tablet (1 mg total) by mouth every 8 (eight) hours as needed for anxiety. 90 tablet 2   nitrofurantoin, macrocrystal-monohydrate, (MACROBID) 100 MG capsule Take 1 capsule (100 mg total) by mouth every 12 (twelve) hours. 14 capsule 0   No facility-administered medications prior to visit.    No Known Allergies  ROS Review of Systems  Constitutional:  Negative for chills and fever.  HENT:  Negative for congestion, sinus pressure, sinus pain and sore throat.   Eyes:  Negative for pain and discharge.  Respiratory:  Negative for cough and shortness of breath.    Cardiovascular:  Negative for chest pain and palpitations.  Gastrointestinal:  Negative for abdominal pain, constipation, nausea and vomiting.  Endocrine: Negative for polydipsia and polyuria.  Genitourinary:  Negative for dysuria and hematuria.  Musculoskeletal:  Positive for arthralgias (Right wrist). Negative for neck pain and neck stiffness.  Skin:  Negative for rash.  Neurological:  Positive for numbness (Right hand). Negative for dizziness, speech difficulty and weakness.  Psychiatric/Behavioral:  Positive for sleep disturbance. Negative for agitation, behavioral problems, dysphoric mood and suicidal ideas. The patient is nervous/anxious.       Objective:    Physical Exam Vitals reviewed.  Constitutional:      General: She is not in acute distress.    Appearance:  She is not diaphoretic.  HENT:     Head: Normocephalic and atraumatic.     Nose: Nose normal.     Mouth/Throat:     Mouth: Mucous membranes are moist.  Eyes:     General: No scleral icterus.    Extraocular Movements: Extraocular movements intact.  Cardiovascular:     Rate and Rhythm: Normal rate and regular rhythm.     Pulses: Normal pulses.     Heart sounds: Normal heart sounds. No murmur heard. Pulmonary:     Breath sounds: Normal breath sounds. No wheezing or rales.  Musculoskeletal:     Right hand: Deformity and bony tenderness present.     Left hand: Deformity and bony tenderness present.     Cervical back: Neck supple. No tenderness.     Right lower leg: No edema.     Left lower leg: No edema.     Comments: Unable to make fist on right hand - Heberden and Bouchard nodes b/l  Skin:    General: Skin is warm.     Findings: No rash.  Neurological:     General: No focal deficit present.     Mental Status: She is alert and oriented to person, place, and time.     Sensory: No sensory deficit.     Motor: No weakness.  Psychiatric:        Mood and Affect: Mood normal.        Behavior: Behavior normal.      BP 110/69 (BP Location: Left Arm, Patient Position: Sitting, Cuff Size: Normal)   Pulse 85   Ht 5\' 7"  (1.702 m)   Wt 112 lb 3.2 oz (50.9 kg)   SpO2 98%   BMI 17.57 kg/m  Wt Readings from Last 3 Encounters:  04/22/23 112 lb 3.2 oz (50.9 kg)  01/22/23 111 lb (50.3 kg)  10/22/22 113 lb 3.2 oz (51.3 kg)    Lab Results  Component Value Date   TSH 1.470 10/22/2022   Lab Results  Component Value Date   WBC 7.2 10/22/2022   HGB 12.6 10/22/2022   HCT 36.5 10/22/2022   MCV 92 10/22/2022   PLT 258 10/22/2022   Lab Results  Component Value Date   NA 141 10/22/2022   K 4.6 10/22/2022   CO2 23 10/22/2022   GLUCOSE 97 10/22/2022   BUN 24 10/22/2022   CREATININE 0.91 10/22/2022   BILITOT 0.5 10/22/2022   ALKPHOS 72 10/22/2022   AST 23 10/22/2022   ALT 26 10/22/2022   PROT 6.9 10/22/2022   ALBUMIN 4.4 10/22/2022   CALCIUM 9.9 10/22/2022   ANIONGAP 5 08/12/2022   EGFR 69 10/22/2022   Lab Results  Component Value Date   CHOL 223 (H) 10/22/2022   Lab Results  Component Value Date   HDL 82 10/22/2022   Lab Results  Component Value Date   LDLCALC 129 (H) 10/22/2022   Lab Results  Component Value Date   TRIG 70 10/22/2022   Lab Results  Component Value Date   CHOLHDL 2.7 10/22/2022   Lab Results  Component Value Date   HGBA1C 5.7 (H) 10/22/2022      Assessment & Plan:   Problem List Items Addressed This Visit       Digestive   GERD (gastroesophageal reflux disease)    Omeprazole as needed Advised to avoid oral NSAIDs        Nervous and Auditory   Bilateral carpal tunnel syndrome    Followed by Orthopedic  surgeon Followed by hand specialist - s/p right sided carpal tunnel release surgery Has wrist splint in place      Relevant Medications   LORazepam (ATIVAN) 1 MG tablet     Other   GAD (generalized anxiety disorder)    Well-controlled Had caregiver stress as well, has been stressed due to loss of her mother and her right hand pain On  Lorazepam 1 mg TID, has been on Benzodiazepine for more than 15 years - refilled, PDMP reviewed Did not tolerate Wellbutrin in the past with her previous PCP Did not like Buspar      Relevant Medications   LORazepam (ATIVAN) 1 MG tablet   Mixed hyperlipidemia    Lipid profile reviewed Advised to follow low cholesterol diet for now      Benzodiazepine dependence (HCC)    O Ativan 1 mg q8h For severe GAD Check urine Toxassure      Chronic prescription benzodiazepine use - Primary   Relevant Orders   ToxASSURE Select 13 (MW), Urine     Meds ordered this encounter  Medications   LORazepam (ATIVAN) 1 MG tablet    Sig: Take 1 tablet (1 mg total) by mouth every 8 (eight) hours as needed for anxiety.    Dispense:  90 tablet    Refill:  2    Follow-up: Return in about 3 months (around 07/23/2023) for GAD.    Anabel Halon, MD

## 2023-04-22 NOTE — Assessment & Plan Note (Signed)
O Ativan 1 mg q8h For severe GAD Check urine Toxassure

## 2023-04-22 NOTE — Assessment & Plan Note (Deleted)
For severe GAD Check Toxassure

## 2023-07-24 ENCOUNTER — Encounter: Payer: Self-pay | Admitting: Internal Medicine

## 2023-07-24 ENCOUNTER — Ambulatory Visit (INDEPENDENT_AMBULATORY_CARE_PROVIDER_SITE_OTHER): Payer: Medicare Other | Admitting: Internal Medicine

## 2023-07-24 VITALS — BP 100/63 | HR 78 | Ht 67.0 in | Wt 115.8 lb

## 2023-07-24 DIAGNOSIS — E782 Mixed hyperlipidemia: Secondary | ICD-10-CM

## 2023-07-24 DIAGNOSIS — F411 Generalized anxiety disorder: Secondary | ICD-10-CM | POA: Diagnosis not present

## 2023-07-24 DIAGNOSIS — G5603 Carpal tunnel syndrome, bilateral upper limbs: Secondary | ICD-10-CM

## 2023-07-24 DIAGNOSIS — R7303 Prediabetes: Secondary | ICD-10-CM

## 2023-07-24 DIAGNOSIS — Z1231 Encounter for screening mammogram for malignant neoplasm of breast: Secondary | ICD-10-CM

## 2023-07-24 DIAGNOSIS — M19041 Primary osteoarthritis, right hand: Secondary | ICD-10-CM

## 2023-07-24 DIAGNOSIS — K580 Irritable bowel syndrome with diarrhea: Secondary | ICD-10-CM

## 2023-07-24 DIAGNOSIS — M19042 Primary osteoarthritis, left hand: Secondary | ICD-10-CM | POA: Insufficient documentation

## 2023-07-24 DIAGNOSIS — Z780 Asymptomatic menopausal state: Secondary | ICD-10-CM

## 2023-07-24 DIAGNOSIS — F1320 Sedative, hypnotic or anxiolytic dependence, uncomplicated: Secondary | ICD-10-CM

## 2023-07-24 MED ORDER — LORAZEPAM 1 MG PO TABS: 1.0000 mg | ORAL_TABLET | Freq: Three times a day (TID) | ORAL | 3 refills | Status: DC | PRN

## 2023-07-24 NOTE — Assessment & Plan Note (Signed)
 Well-controlled On Lorazepam  1 mg TID, has been on Benzodiazepine for more than 15 years - refilled, PDMP reviewed Did not tolerate Wellbutrin in the past with her previous PCP Did not like Buspar 

## 2023-07-24 NOTE — Assessment & Plan Note (Addendum)
 Bilateral hand pain and stiffness - has Heberden and Bouchard nodes bilaterally Tylenol  as needed for pain Voltaren gel as needed Continue simple hand exercises

## 2023-07-24 NOTE — Patient Instructions (Signed)
 Please continue to take medications as prescribed.  Please continue to follow heart healthy diet and perform moderate exercise/walking at least 150 mins/week.

## 2023-07-24 NOTE — Progress Notes (Signed)
 Established Patient Office Visit  Subjective:  Patient ID: Allison Dickerson, female    DOB: [birth year]  Age: 68 y.o. MRN: [id number]  CC:  Chief Complaint  Patient presents with   Anxiety    Follow up    HPI Allison Dickerson is a 68 y.o. female with past medical history of GERD, polyarthritis, severe anxiety and pulmonary nodule who presents for f/u of her chronic medical conditions.  GAD: She takes Ativan  1 mg 3 times daily for anxiety.  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 She has had difficulty concentrating and panic episodes while trying to taper the dose. Denies anhedonia, SI or HI currently.  She has chronic right hand pain, but has noticed improvement now since surgery.  She had a ganglion cyst on the right hand, for which she saw Hand specialist.  She also has bilateral carpal tunnel syndrome. She is status post right carpal tunnel release with right small and ring finger extensor reconstructions with tendon transfers and right distal ulnar resection with ECU tendon transfer. She underwent this surgery by Dr. Donnice Robinsons on 08/28/2022.  She wants to talk about her preventive tests later, agrees to get Mammography, DEXA scan, still denies vaccines.   Past Medical History:  Diagnosis Date   Anxiety    Arthritis    Phreesia 06/24/2020   Cervical cancer (HCC)    When younger.   Diverticulitis    Pinched nerve    right hand    Past Surgical History:  Procedure Laterality Date   ABDOMINAL HYSTERECTOMY     BIOPSY  10/24/2020   Procedure: BIOPSY;  Surgeon: Eartha Angelia Sieving, MD;  Location: [facility];  Service: Gastroenterology;;   cervical cancer removal     COLONOSCOPY WITH PROPOFOL  N/A 10/24/2020   Procedure: COLONOSCOPY WITH PROPOFOL ;  Surgeon: Eartha Angelia Sieving, MD;  Location: [facility];  Service: Gastroenterology;  Laterality: N/A;  9:00 am    ESOPHAGOGASTRODUODENOSCOPY (EGD) WITH PROPOFOL  N/A 10/24/2020   Procedure: ESOPHAGOGASTRODUODENOSCOPY (EGD) WITH PROPOFOL ;  Surgeon: Eartha Angelia Sieving, MD;  Location: [facility];  Service: Gastroenterology;  Laterality: N/A;   EYE SURGERY N/A    Phreesia 06/24/2020   POLYPECTOMY  10/24/2020   Procedure: POLYPECTOMY;  Surgeon: Eartha Angelia, Sieving, MD;  Location: [facility];  Service: Gastroenterology;;    Family History  Problem Relation Age of Onset   Cancer Father    Alcohol abuse Sister    Cancer Other     Social History   Socioeconomic History   Marital status: Married    Spouse name: Not on file   Number of children: Not on file   Years of education: Not on file   Highest education level: Associate degree: occupational, scientist, product/process development, or vocational program  Occupational History   Occupation: retired  Tobacco Use   Smoking status: Former    Current packs/day: 0.50    Average packs/day: 0.5 packs/day for 20.0 years (10.0 ttl pk-yrs)    Types: Cigarettes   Smokeless tobacco: Never  Vaping Use   Vaping status: Never Used  Substance and Sexual Activity   Alcohol use: No   Drug use: Yes    Types: Marijuana    Comment: occ with gummies   Sexual activity: Not Currently    Birth control/protection: Surgical    Comment: hyst  Other Topics Concern   Not on file  Social History  Narrative   Not on file   Social Drivers of Health   Financial Resource Strain: Low Risk  (10/22/2022)   Overall Financial Resource Strain (CARDIA)    Difficulty of Paying Living Expenses: Not very hard  Food Insecurity: No Food Insecurity (10/22/2022)   Hunger Vital Sign    Worried About Running Out of Food in the Last Year: Never true    Ran Out of Food in the Last Year: Never true  Transportation Needs: No Transportation Needs (10/22/2022)   PRAPARE - Administrator, Civil Service (Medical): No    Lack of Transportation (Non-Medical): No  Physical Activity: Inactive  (10/22/2022)   Exercise Vital Sign    Days of Exercise per Week: 0 days    Minutes of Exercise per Session: 0 min  Stress: No Stress Concern Present (10/22/2022)   [employer] of Occupational Health - Occupational Stress Questionnaire    Feeling of Stress : Only a little  Social Connections: Socially Isolated (10/22/2022)   Social Connection and Isolation Panel [NHANES]    Frequency of Communication with Friends and Family: Once a week    Frequency of Social Gatherings with Friends and Family: Once a week    Attends Religious Services: Never    Database Administrator or Organizations: No    Attends Banker Meetings: Never    Marital Status: Married  Catering Manager Violence: Not At Risk (09/02/2022)   Humiliation, Afraid, Rape, and Kick questionnaire    Fear of Current or Ex-Partner: No    Emotionally Abused: No    Physically Abused: No    Sexually Abused: No    Outpatient Medications Prior to Visit  Medication Sig Dispense Refill   ibuprofen  (ADVIL ) 800 MG tablet Take 800 mg by mouth.     meclizine  (ANTIVERT ) 12.5 MG tablet Take 1 tablet (12.5 mg total) by mouth 3 (three) times daily as needed for dizziness. 30 tablet 0   Multiple Vitamin (MULTIVITAMIN WITH MINERALS) TABS tablet Take 1 tablet by mouth daily.     ondansetron  (ZOFRAN -ODT) 4 MG disintegrating tablet Take 1 tablet (4 mg total) by mouth every 8 (eight) hours as needed for nausea or vomiting. 20 tablet 0   LORazepam  (ATIVAN ) 1 MG tablet Take 1 tablet (1 mg total) by mouth every 8 (eight) hours as needed for anxiety. 90 tablet 2   No facility-administered medications prior to visit.    No Known Allergies  ROS Review of Systems  Constitutional:  Negative for chills and fever.  HENT:  Negative for congestion, sinus pressure, sinus pain and sore throat.   Eyes:  Negative for pain and discharge.  Respiratory:  Negative for cough and shortness of breath.   Cardiovascular:  Negative for chest pain and  palpitations.  Gastrointestinal:  Negative for abdominal pain, constipation, nausea and vomiting.  Endocrine: Negative for polydipsia and polyuria.  Genitourinary:  Negative for dysuria and hematuria.  Musculoskeletal:  Positive for arthralgias (Right wrist). Negative for neck pain and neck stiffness.  Skin:  Negative for rash.  Neurological:  Positive for numbness (Right hand). Negative for dizziness, speech difficulty and weakness.  Psychiatric/Behavioral:  Positive for sleep disturbance. Negative for agitation, behavioral problems, dysphoric mood and suicidal ideas. The patient is nervous/anxious.       Objective:    Physical Exam Vitals reviewed.  Constitutional:      General: She is not in acute distress.    Appearance: She is not diaphoretic.  HENT:  Head: Normocephalic and atraumatic.     Nose: Nose normal.     Mouth/Throat:     Mouth: Mucous membranes are moist.  Eyes:     General: No scleral icterus.    Extraocular Movements: Extraocular movements intact.  Cardiovascular:     Rate and Rhythm: Normal rate and regular rhythm.     Pulses: Normal pulses.     Heart sounds: Normal heart sounds. No murmur heard. Pulmonary:     Breath sounds: Normal breath sounds. No wheezing or rales.  Musculoskeletal:     Right hand: Deformity and bony tenderness present.     Left hand: Deformity and bony tenderness present.     Cervical back: Neck supple. No tenderness.     Right lower leg: No edema.     Left lower leg: No edema.     Comments: Unable to make fist on right hand - Heberden and Bouchard nodes b/l  Skin:    General: Skin is warm.     Findings: No rash.  Neurological:     General: No focal deficit present.     Mental Status: She is alert and oriented to person, place, and time.     Sensory: No sensory deficit.     Motor: No weakness.  Psychiatric:        Mood and Affect: Mood is anxious.        Behavior: Behavior normal.     BP 100/63   Pulse 78   Ht 5' 7  (1.702 m)   Wt 115 lb 12.8 oz (52.5 kg)   SpO2 98%   BMI 18.14 kg/m  Wt Readings from Last 3 Encounters:  07/24/23 115 lb 12.8 oz (52.5 kg)  04/22/23 112 lb 3.2 oz (50.9 kg)  01/22/23 111 lb (50.3 kg)    Lab Results  Component Value Date   TSH 1.470 10/22/2022   Lab Results  Component Value Date   WBC 7.2 10/22/2022   HGB 12.6 10/22/2022   HCT 36.5 10/22/2022   MCV 92 10/22/2022   PLT 258 10/22/2022   Lab Results  Component Value Date   NA 141 10/22/2022   K 4.6 10/22/2022   CO2 23 10/22/2022   GLUCOSE 97 10/22/2022   BUN 24 10/22/2022   CREATININE 0.91 10/22/2022   BILITOT 0.5 10/22/2022   ALKPHOS 72 10/22/2022   AST 23 10/22/2022   ALT 26 10/22/2022   PROT 6.9 10/22/2022   ALBUMIN 4.4 10/22/2022   CALCIUM  9.9 10/22/2022   ANIONGAP 5 08/12/2022   EGFR 69 10/22/2022   Lab Results  Component Value Date   CHOL 223 (H) 10/22/2022   Lab Results  Component Value Date   HDL 82 10/22/2022   Lab Results  Component Value Date   LDLCALC 129 (H) 10/22/2022   Lab Results  Component Value Date   TRIG 70 10/22/2022   Lab Results  Component Value Date   CHOLHDL 2.7 10/22/2022   Lab Results  Component Value Date   HGBA1C 5.7 (H) 10/22/2022      Assessment & Plan:   Problem List Items Addressed This Visit       Digestive   IBS (irritable bowel syndrome)   Now resolved Has tried Bentyl  and Levsin , did not provide relief Takes ginger now        Nervous and Auditory   Bilateral carpal tunnel syndrome   Followed by Orthopedic surgeon Followed by hand specialist - s/p right sided carpal tunnel release surgery Has wrist  splint in place      Relevant Medications   LORazepam  (ATIVAN ) 1 MG tablet     Musculoskeletal and Integument   Primary osteoarthritis of both hands   Bilateral hand pain and stiffness - has Heberden and Bouchard nodes bilaterally Tylenol  as needed for pain Voltaren gel as needed Continue simple hand exercises        Other    GAD (generalized anxiety disorder) - Primary   Well-controlled On Lorazepam  1 mg TID, has been on Benzodiazepine for more than 15 years - refilled, PDMP reviewed Did not tolerate Wellbutrin in the past with her previous PCP Did not like Buspar       Relevant Medications   LORazepam  (ATIVAN ) 1 MG tablet   Prediabetes   Lab Results  Component Value Date   HGBA1C 5.7 (H) 10/22/2022   Advised to follow low carb diet DASH diet material provided      Mixed hyperlipidemia   Lipid profile reviewed Advised to follow low cholesterol diet for now      Benzodiazepine dependence (HCC)   O Ativan  1 mg q8h For severe GAD Check urine Toxassure      Other Visit Diagnoses       Breast cancer screening by mammogram       Relevant Orders   MM 3D SCREENING MAMMOGRAM BILATERAL BREAST     Post-menopausal       Relevant Orders   DG Bone Density         Meds ordered this encounter  Medications   LORazepam  (ATIVAN ) 1 MG tablet    Sig: Take 1 tablet (1 mg total) by mouth every 8 (eight) hours as needed for anxiety.    Dispense:  90 tablet    Refill:  3    Follow-up: Return in about 4 months (around 11/21/2023) for GAD.    Suzzane MARLA Blanch, MD

## 2023-07-24 NOTE — Assessment & Plan Note (Signed)
Followed by Orthopedic surgeon Followed by hand specialist - s/p right sided carpal tunnel release surgery Has wrist splint in place

## 2023-07-24 NOTE — Assessment & Plan Note (Signed)
 O Ativan 1 mg q8h For severe GAD Check urine Toxassure

## 2023-07-24 NOTE — Assessment & Plan Note (Signed)
Lipid profile reviewed Advised to follow low cholesterol diet for now

## 2023-07-24 NOTE — Assessment & Plan Note (Signed)
Now resolved Has tried Bentyl and Levsin, did not provide relief Takes ginger now

## 2023-07-24 NOTE — Assessment & Plan Note (Signed)
 Lab Results  Component Value Date   HGBA1C 5.7 (H) 10/22/2022   Advised to follow low carb diet DASH diet material provided

## 2023-07-27 LAB — TOXASSURE SELECT 13 (MW), URINE

## 2023-08-04 ENCOUNTER — Other Ambulatory Visit: Payer: Self-pay

## 2023-08-04 ENCOUNTER — Encounter (HOSPITAL_COMMUNITY): Payer: Self-pay

## 2023-08-04 ENCOUNTER — Emergency Department (HOSPITAL_COMMUNITY)
Admission: EM | Admit: 2023-08-04 | Discharge: 2023-08-04 | Disposition: A | Discharge status: Home / Self Care | Payer: Medicare Other | Attending: Emergency Medicine | Admitting: Emergency Medicine

## 2023-08-04 DIAGNOSIS — N3000 Acute cystitis without hematuria: Secondary | ICD-10-CM | POA: Diagnosis not present

## 2023-08-04 DIAGNOSIS — R350 Frequency of micturition: Secondary | ICD-10-CM | POA: Diagnosis present

## 2023-08-04 LAB — CBC
HCT: 42.6 % (ref 36.0–46.0)
Hemoglobin: 13.5 g/dL (ref 12.0–15.0)
MCH: 30.1 pg (ref 26.0–34.0)
MCHC: 31.7 g/dL (ref 30.0–36.0)
MCV: 94.9 fL (ref 80.0–100.0)
Platelets: 307 10*3/uL (ref 150–400)
RBC: 4.49 MIL/uL (ref 3.87–5.11)
RDW: 12.5 % (ref 11.5–15.5)
WBC: 6.7 10*3/uL (ref 4.0–10.5)
nRBC: 0 % (ref 0.0–0.2)

## 2023-08-04 LAB — BASIC METABOLIC PANEL
Anion gap: 11 (ref 5–15)
BUN: 22 mg/dL (ref 8–23)
CO2: 25 mmol/L (ref 22–32)
Calcium: 9.7 mg/dL (ref 8.9–10.3)
Chloride: 106 mmol/L (ref 98–111)
Creatinine, Ser: 0.9 mg/dL (ref 0.44–1.00)
GFR, Estimated: >60 mL/min (ref >60)
Glucose, Bld: 98 mg/dL (ref 70–99)
Potassium: 4.4 mmol/L (ref 3.5–5.1)
Sodium: 142 mmol/L (ref 135–145)

## 2023-08-04 LAB — URINALYSIS, ROUTINE W REFLEX MICROSCOPIC
Bilirubin Urine: NEGATIVE
Glucose, UA: NEGATIVE mg/dL
Hgb urine dipstick: NEGATIVE
Ketones, ur: 5 mg/dL — AB
Nitrite: POSITIVE — AB
Protein, ur: 100 mg/dL — AB
Specific Gravity, Urine: 1.016 (ref 1.005–1.030)
WBC, UA: >50 WBC/hpf (ref 0–5)
pH: 6 (ref 5.0–8.0)

## 2023-08-04 MED ORDER — CEPHALEXIN 500 MG PO CAPS: 500.0000 mg | ORAL_CAPSULE | Freq: Three times a day (TID) | ORAL | 0 refills | Status: DC

## 2023-08-04 MED ORDER — STERILE WATER FOR INJECTION IJ SOLN
INTRAMUSCULAR | Status: AC
  Filled 2023-08-04: qty 10

## 2023-08-04 MED ORDER — CEFTRIAXONE SODIUM 1 G IJ SOLR
1.0000 g | Freq: Once | INTRAMUSCULAR | Status: AC
  Administered 2023-08-04: 1 g via INTRAMUSCULAR
  Filled 2023-08-04: qty 10

## 2023-08-04 NOTE — ED Provider Notes (Signed)
[place] EMERGENCY DEPARTMENT AT [facility] Provider Note   CSN: [id number] Arrival date & time: 08/04/23  1034     History  Chief Complaint  Patient presents with   Urinary Frequency    Allison Dickerson is a 68 y.o. female.   Urinary Frequency Pertinent negatives include no chest pain, no abdominal pain and no shortness of breath.        Allison Dickerson is a 68 y.o. female past medical history of generalized anxiety disorder, OAB IBS who presents to the Emergency Department complaining of increased urinary frequency x 3 days with vaginal burning postvoid.  Is having discomfort to her lower abdomen x 1 week.  History of frequent UTIs states her current symptoms feel similar to previous.  She frequently sees her urologist and requires antibiotics for UTIs.  She denies any fever, chills, nausea vomiting, constant flank pain or hematuria.  No History of kidney stones.   Home Medications Prior to Admission medications   Medication Sig Start Date End Date Taking? Authorizing Provider  ibuprofen (ADVIL) 800 MG tablet Take 800 mg by mouth. 04/10/22   [provider]  LORazepam (ATIVAN) 1 MG tablet Take 1 tablet (1 mg total) by mouth every 8 (eight) hours as needed for anxiety. 07/24/23   Anabel Halon, MD  meclizine (ANTIVERT) 12.5 MG tablet Take 1 tablet (12.5 mg total) by mouth 3 (three) times daily as needed for dizziness. 08/12/22   Jeanelle Malling, PA  Multiple Vitamin (MULTIVITAMIN WITH MINERALS) TABS tablet Take 1 tablet by mouth daily.    [provider]  ondansetron (ZOFRAN-ODT) 4 MG disintegrating tablet Take 1 tablet (4 mg total) by mouth every 8 (eight) hours as needed for nausea or vomiting. 08/12/22   Jeanelle Malling, PA      Allergies    Patient has no known allergies.    Review of Systems   Review of Systems  Constitutional:  Negative for appetite change, chills and fever.  Respiratory:  Negative for chest tightness and shortness of breath.    Cardiovascular:  Negative for chest pain.  Gastrointestinal:  Negative for abdominal pain, diarrhea, nausea and vomiting.  Genitourinary:  Positive for dysuria and frequency. Negative for difficulty urinating, flank pain, hematuria, vaginal bleeding, vaginal discharge and vaginal pain.  Musculoskeletal:  Negative for back pain.  Neurological:  Negative for weakness.    Physical Exam Updated Vital Signs BP 104/70 (BP Location: Right Arm)   Pulse 85   Temp 98.3 F (36.8 C)   Resp 15   Ht 5\' 7"  (1.702 m)   Wt 52.5 kg   SpO2 100%   BMI 18.14 kg/m  Physical Exam Vitals and nursing note reviewed.  Constitutional:      General: She is not in acute distress.    Appearance: Normal appearance. She is not ill-appearing or toxic-appearing.  Cardiovascular:     Rate and Rhythm: Normal rate and regular rhythm.     Pulses: Normal pulses.  Pulmonary:     Effort: Pulmonary effort is normal.  Abdominal:     General: There is no distension.     Palpations: Abdomen is soft.     Tenderness: There is no abdominal tenderness. There is no right CVA tenderness or left CVA tenderness.  Musculoskeletal:        General: Normal range of motion.  Skin:    General: Skin is warm.  Neurological:     General: No focal deficit present.  Mental Status: She is alert.     Sensory: No sensory deficit.     Motor: No weakness.     ED Results / Procedures / Treatments   Labs (all labs ordered are listed, but only abnormal results are displayed) Labs Reviewed  URINALYSIS, ROUTINE W REFLEX MICROSCOPIC - Abnormal; Notable for the following components:      Result Value   APPearance CLOUDY (*)    Ketones, ur 5 (*)    Protein, ur 100 (*)    Nitrite POSITIVE (*)    Leukocytes,Ua LARGE (*)    Bacteria, UA FEW (*)    All other components within normal limits  URINE CULTURE  BASIC METABOLIC PANEL  CBC    EKG None  Radiology No results found.  Procedures Procedures    Medications Ordered in  ED Medications  cefTRIAXone (ROCEPHIN) injection 1 g (has no administration in time range)    ED Course/ Medical Decision Making/ A&P                                 Medical Decision Making Patient here with lower abdominal pain and dysuria symptoms for several days.  Endorses recurrent UTIs, followed by urology.  Was unable to get in with her urologist today due to holiday.  Here for her symptoms.  She denies any fever, vomiting, flank pain  Vital signs reviewed.  Patient well-appearing.  Differential would include but not limited to acute cystitis, pyelonephritis, kidney stone, acute surgical abdomen, diverticulitis, gastroenteritis  Amount and/or Complexity of Data Reviewed Labs: ordered.    Details: Labs interpreted by me, no evidence of leukocytosis, chemistries without derangement.  Urinalysis shows cloudy urine with positive nitrites large leukocytes and greater than 50 white cells.  Urine culture is pending Discussion of management or test interpretation with external provider(s): On review of medical records, patient noted to have UTIs in the past that have grown E. Coli with sensitivities to cephalosporins.    Patient with acute cystitis.  Vital signs reassuring.  Urine culture pending.  Will treat with IM Rocephin here and prescription for cephalexin for 7 days.  She will follow-up outpatient with urology.  Return precautions were also given.  Risk Prescription drug management.           Final Clinical Impression(s) / ED Diagnoses Final diagnoses:  Acute cystitis without hematuria    Rx / DC Orders ED Discharge Orders     None         Pauline Aus, PA-C 08/04/23 1605    Bethann Berkshire, MD 08/06/23 1601

## 2023-08-04 NOTE — Discharge Instructions (Signed)
Your workup today shows he has a urinary tract infection.  You have been given an injection of antibiotics today.  I have also prescribed antibiotics for an additional 7 days.  Please take as directed until finished.  Drink plenty of water.  Follow-up with your primary care provider for recheck or you with your urologist.  Return to emergency department for any new or worsening symptoms.

## 2023-08-04 NOTE — ED Triage Notes (Signed)
Vaginal burning and increased frequency x3 days. Abdominal Pain in mid section x1 week. Denies seeing blood during urination or N/V.

## 2023-08-06 LAB — URINE CULTURE: Culture: >=100000 — AB

## 2023-11-26 ENCOUNTER — Ambulatory Visit (INDEPENDENT_AMBULATORY_CARE_PROVIDER_SITE_OTHER): Payer: Medicare Other | Admitting: Internal Medicine

## 2023-11-26 ENCOUNTER — Encounter: Payer: Self-pay | Admitting: Internal Medicine

## 2023-11-26 VITALS — BP 102/64 | HR 91 | Ht 67.0 in | Wt 115.0 lb

## 2023-11-26 DIAGNOSIS — M0580 Other rheumatoid arthritis with rheumatoid factor of unspecified site: Secondary | ICD-10-CM

## 2023-11-26 DIAGNOSIS — M130 Polyarthritis, unspecified: Secondary | ICD-10-CM

## 2023-11-26 DIAGNOSIS — R42 Dizziness and giddiness: Secondary | ICD-10-CM | POA: Diagnosis not present

## 2023-11-26 DIAGNOSIS — G5603 Carpal tunnel syndrome, bilateral upper limbs: Secondary | ICD-10-CM | POA: Diagnosis not present

## 2023-11-26 DIAGNOSIS — K219 Gastro-esophageal reflux disease without esophagitis: Secondary | ICD-10-CM

## 2023-11-26 DIAGNOSIS — E782 Mixed hyperlipidemia: Secondary | ICD-10-CM

## 2023-11-26 DIAGNOSIS — F411 Generalized anxiety disorder: Secondary | ICD-10-CM

## 2023-11-26 DIAGNOSIS — M19042 Primary osteoarthritis, left hand: Secondary | ICD-10-CM

## 2023-11-26 DIAGNOSIS — R110 Nausea: Secondary | ICD-10-CM

## 2023-11-26 DIAGNOSIS — M19041 Primary osteoarthritis, right hand: Secondary | ICD-10-CM

## 2023-11-26 MED ORDER — MECLIZINE HCL 25 MG PO TABS: 25.0000 mg | ORAL_TABLET | Freq: Two times a day (BID) | ORAL | 1 refills | Status: AC | PRN

## 2023-11-26 MED ORDER — ONDANSETRON 4 MG PO TBDP: 4.0000 mg | ORAL_TABLET | Freq: Three times a day (TID) | ORAL | 0 refills | Status: AC | PRN

## 2023-11-26 MED ORDER — LORAZEPAM 1 MG PO TABS: 1.0000 mg | ORAL_TABLET | Freq: Three times a day (TID) | ORAL | 3 refills | Status: DC | PRN

## 2023-11-26 NOTE — Patient Instructions (Addendum)
 Please take Meclizine  as needed for dizziness.  Please perform simple vestibular exercises for vertigo.  Please take Zofran  as needed for nausea.  Please continue taking Ativan  as prescribed.

## 2023-11-26 NOTE — Assessment & Plan Note (Addendum)
 Followed by Orthopedic surgeon Followed by hand specialist - s/p right sided carpal tunnel release surgery

## 2023-11-26 NOTE — Assessment & Plan Note (Signed)
 Dizziness and nausea with position change likely due to vertigo Meclizine  25 mg twice daily as needed for dizziness Avoid sudden positional changes Maintain adequate hydration and eat at regular intervals Advised to perform vestibular exercises as tolerated

## 2023-11-26 NOTE — Assessment & Plan Note (Signed)
 Has bilateral hand, right wrist and bilateral shoulder pain Check CBC, ANA, RF, CRP If initial autoimmune workup positive, will refer to rheumatology

## 2023-11-26 NOTE — Assessment & Plan Note (Signed)
Omeprazole as needed Advised to avoid oral NSAIDs 

## 2023-11-26 NOTE — Assessment & Plan Note (Addendum)
Check lipid profile Advised to follow low cholesterol diet for now 

## 2023-11-26 NOTE — Progress Notes (Addendum)
 Established Patient Office Visit  Subjective:  Patient ID: Allison Dickerson, female    DOB: 02-Aug-1955  Age: 68 y.o. MRN: [id number]  CC:  Chief Complaint  Patient presents with   Medical Management of Chronic Issues    4 month f/u, needs a referral to rheumatologist.    Dizziness    Reports sx of vertigo needs refills on medications.     HPI Allison Dickerson is a 68 y.o. female with past medical history of GERD, polyarthritis, severe anxiety and pulmonary nodule who presents for f/u of her chronic medical conditions.  GAD: She takes Ativan  1 mg 3 times daily for anxiety.  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 She has had difficulty concentrating and panic episodes while trying to taper the dose. Denies anhedonia, SI or HI currently.  She reports bilateral hand pain, has nodules over small joints.  She has stiffness of bilateral hands, lasting for more than 30 minutes.  She has tried taking Tylenol , ibuprofen  and has used topical agents including Voltaren, IcyHot with inadequate relief.  She has chronic right hand pain, but has noticed improvement now since surgery.  She had a ganglion cyst on the right hand, for which she saw Hand specialist.  She also has bilateral carpal tunnel syndrome. She is status post right carpal tunnel release with right small and ring finger extensor reconstructions with tendon transfers and right distal ulnar resection with ECU tendon transfer. She underwent this surgery by Dr. Florida Hurter on 08/28/2022.  She reports intermittent dizziness, worse with position change.  She also has nausea with dizziness.  She has a history of vertigo.  Denies any recent URTI.  She wants to talk about her preventive tests later, agrees to get Mammography, DEXA scan, still denies vaccines.   Past Medical History:  Diagnosis Date   Anxiety    Arthritis    Phreesia 06/24/2020    Cervical cancer (HCC)    When younger.   Diverticulitis    Pinched nerve    right hand    Past Surgical History:  Procedure Laterality Date   ABDOMINAL HYSTERECTOMY     BIOPSY  10/24/2020   Procedure: BIOPSY;  Surgeon: Urban Garden, MD;  Location: [facility];  Service: Gastroenterology;;   cervical cancer removal     COLONOSCOPY WITH PROPOFOL  N/A 10/24/2020   Procedure: COLONOSCOPY WITH PROPOFOL ;  Surgeon: Urban Garden, MD;  Location: [facility];  Service: Gastroenterology;  Laterality: N/A;  9:00 am   ESOPHAGOGASTRODUODENOSCOPY (EGD) WITH PROPOFOL  N/A 10/24/2020   Procedure: ESOPHAGOGASTRODUODENOSCOPY (EGD) WITH PROPOFOL ;  Surgeon: Urban Garden, MD;  Location: [facility];  Service: Gastroenterology;  Laterality: N/A;   EYE SURGERY N/A    Phreesia 06/24/2020   HAND SURGERY Right    POLYPECTOMY  10/24/2020   Procedure: POLYPECTOMY;  Surgeon: Urban Garden, MD;  Location: [facility];  Service: Gastroenterology;;    Family History  Problem Relation Age of Onset   Cancer Father    Alcohol abuse Sister    Cancer Other     Social History   Socioeconomic History   Marital status: Married    Spouse name: Not on file   Number of children: Not on file   Years of education: Not on file   Highest education level: Associate degree: occupational, Scientist, product/process development, or vocational program  Occupational History   Occupation: retired  Tobacco Use   Smoking status: Former    Current packs/day: 0.50    Average packs/day: 0.5 packs/day for 20.0 years (10.0 ttl pk-yrs)    Types: Cigarettes   Smokeless tobacco: Never  Vaping Use   Vaping status: Never Used  Substance and Sexual Activity   Alcohol use: No   Drug use: Yes    Types: Marijuana    Comment: occ with gummies   Sexual activity: Yes    Birth control/protection: Surgical    Comment: hyst  Other Topics Concern   Not on file  Social History Narrative   Not on file    Social Drivers of Health   Financial Resource Strain: Low Risk  (10/22/2022)   Overall Financial Resource Strain (CARDIA)    Difficulty of Paying Living Expenses: Not very hard  Food Insecurity: No Food Insecurity (10/22/2022)   Hunger Vital Sign    Worried About Running Out of Food in the Last Year: Never true    Ran Out of Food in the Last Year: Never true  Transportation Needs: No Transportation Needs (10/22/2022)   PRAPARE - Administrator, Civil Service (Medical): No    Lack of Transportation (Non-Medical): No  Physical Activity: Unknown (10/22/2022)   Exercise Vital Sign    Days of Exercise per Week: 0 days    Minutes of Exercise per Session: Not on file  Recent Concern: Physical Activity - Inactive (10/22/2022)   Exercise Vital Sign    Days of Exercise per Week: 0 days    Minutes of Exercise per Session: 0 min  Stress: No Stress Concern Present (10/22/2022)   [employer] of Occupational Health - Occupational Stress Questionnaire    Feeling of Stress : Only a little  Social Connections: Socially Isolated (10/22/2022)   Social Connection and Isolation Panel    Frequency of Communication with Friends and Family: Once a week    Frequency of Social Gatherings with Friends and Family: Once a week    Attends Religious Services: Never    Database administrator or Organizations: No    Attends Engineer, structural: Not on file    Marital Status: Married  Catering manager Violence: Not At Risk (09/02/2022)   Humiliation, Afraid, Rape, and Kick questionnaire    Fear of Current or Ex-Partner: No    Emotionally Abused: No    Physically Abused: No    Sexually Abused: No    Outpatient Medications Prior to Visit  Medication Sig Dispense Refill   ibuprofen  (ADVIL ) 800 MG tablet Take 800 mg by mouth.     Multiple Vitamin (MULTIVITAMIN WITH MINERALS) TABS tablet Take 1 tablet by mouth daily.     cephALEXin  (KEFLEX ) 500 MG capsule Take 1 capsule (500 mg total) by mouth 3  (three) times daily. 21 capsule 0   LORazepam  (ATIVAN ) 1 MG tablet Take 1 tablet (1 mg total) by mouth every 8 (eight) hours as needed for anxiety. 90 tablet 3   meclizine  (ANTIVERT ) 12.5 MG tablet Take 1 tablet (12.5 mg total) by mouth 3 (three) times daily as needed for dizziness. 30 tablet 0   ondansetron  (ZOFRAN -ODT) 4 MG disintegrating tablet Take 1 tablet (4 mg total) by mouth every 8 (eight) hours as needed for nausea or vomiting. 20 tablet 0   No facility-administered medications prior to visit.    No Known Allergies  ROS Review of Systems  Constitutional:  Negative for chills and fever.  HENT:  Negative for congestion, sinus pressure,  sinus pain and sore throat.   Eyes:  Negative for pain and discharge.  Respiratory:  Negative for cough and shortness of breath.   Cardiovascular:  Negative for chest pain and palpitations.  Gastrointestinal:  Positive for nausea. Negative for abdominal pain, constipation and vomiting.  Endocrine: Negative for polydipsia and polyuria.  Genitourinary:  Negative for dysuria and hematuria.  Musculoskeletal:  Positive for arthralgias (Right wrist, b/l hands). Negative for neck pain and neck stiffness.  Skin:  Negative for rash.  Neurological:  Positive for dizziness and numbness (Right hand). Negative for speech difficulty and weakness.  Psychiatric/Behavioral:  Positive for sleep disturbance. Negative for agitation, behavioral problems, dysphoric mood and suicidal ideas. The patient is nervous/anxious.       Objective:    Physical Exam Vitals reviewed.  Constitutional:      General: She is not in acute distress.    Appearance: She is not diaphoretic.  HENT:     Head: Normocephalic and atraumatic.     Nose: Nose normal.     Mouth/Throat:     Mouth: Mucous membranes are moist.   Eyes:     General: No scleral icterus.    Extraocular Movements: Extraocular movements intact.    Cardiovascular:     Rate and Rhythm: Normal rate and regular  rhythm.     Heart sounds: Normal heart sounds. No murmur heard. Pulmonary:     Breath sounds: Normal breath sounds. No wheezing or rales.   Musculoskeletal:     Right hand: Deformity and bony tenderness present.     Left hand: Deformity and bony tenderness present.     Cervical back: Neck supple. No tenderness.     Right lower leg: No edema.     Left lower leg: No edema.     Comments: Unable to make fist on right hand - Heberden and Bouchard nodes b/l   Skin:    General: Skin is warm.     Findings: No rash.   Neurological:     General: No focal deficit present.     Mental Status: She is alert and oriented to person, place, and time.     Sensory: No sensory deficit.     Motor: No weakness.   Psychiatric:        Mood and Affect: Mood is anxious.        Behavior: Behavior normal.     BP 102/64   Pulse 91   Ht 5' 7 (1.702 m)   Wt 115 lb (52.2 kg)   SpO2 97%   BMI 18.01 kg/m  Wt Readings from Last 3 Encounters:  11/26/23 115 lb (52.2 kg)  08/04/23 115 lb 12.8 oz (52.5 kg)  07/24/23 115 lb 12.8 oz (52.5 kg)    Lab Results  Component Value Date   TSH 1.470 10/22/2022   Lab Results  Component Value Date   WBC 6.4 11/26/2023   HGB 12.5 11/26/2023   HCT 38.1 11/26/2023   MCV 94 11/26/2023   PLT 288 11/26/2023   Lab Results  Component Value Date   NA 142 11/26/2023   K 4.2 11/26/2023   CO2 21 11/26/2023   GLUCOSE 105 (H) 11/26/2023   BUN 21 11/26/2023   CREATININE 0.92 11/26/2023   BILITOT 0.3 11/26/2023   ALKPHOS 83 11/26/2023   AST 27 11/26/2023   ALT 19 11/26/2023   PROT 7.0 11/26/2023   ALBUMIN 4.4 11/26/2023   CALCIUM  9.8 11/26/2023   ANIONGAP 11 08/04/2023   EGFR 67  11/26/2023   Lab Results  Component Value Date   CHOL 214 (H) 11/26/2023   Lab Results  Component Value Date   HDL 77 11/26/2023   Lab Results  Component Value Date   LDLCALC 123 (H) 11/26/2023   Lab Results  Component Value Date   TRIG 79 11/26/2023   Lab Results   Component Value Date   CHOLHDL 2.8 11/26/2023   Lab Results  Component Value Date   HGBA1C 5.7 (H) 10/22/2022      Assessment & Plan:   Problem List Items Addressed This Visit       Digestive   GERD (gastroesophageal reflux disease)   Omeprazole  as needed Advised to avoid oral NSAIDs      Relevant Medications   meclizine  (ANTIVERT ) 25 MG tablet   ondansetron  (ZOFRAN -ODT) 4 MG disintegrating tablet   Other Relevant Orders   CBC (Completed)     Nervous and Auditory   Bilateral carpal tunnel syndrome   Followed by Orthopedic surgeon Followed by hand specialist - s/p right sided carpal tunnel release surgery      Relevant Medications   LORazepam  (ATIVAN ) 1 MG tablet   Other Relevant Orders   CBC (Completed)     Musculoskeletal and Integument   Polyarthritis with positive rheumatoid factor (HCC)   Has bilateral hand, right wrist and bilateral shoulder pain Check CBC, ANA, RF, CRP If initial autoimmune workup positive, will refer to rheumatology      Relevant Orders   Ambulatory referral to Rheumatology   Primary osteoarthritis of both hands   Bilateral hand pain and stiffness - has Heberden and Bouchard nodes bilaterally Tylenol  as needed for pain Voltaren gel as needed Continue simple hand exercises        Other   GAD (generalized anxiety disorder) - Primary (Chronic)   Well-controlled On Lorazepam  1 mg TID, has been on Benzodiazepine for more than 15 years - refilled, PDMP reviewed Did not tolerate Wellbutrin in the past with her previous PCP Did not like Buspar       Relevant Medications   LORazepam  (ATIVAN ) 1 MG tablet   Other Relevant Orders   CMP14+EGFR (Completed)   CBC (Completed)   Mixed hyperlipidemia   Check lipid profile Advised to follow low cholesterol diet for now      Relevant Orders   Lipid Profile (Completed)   Vertigo   Dizziness and nausea with position change likely due to vertigo Meclizine  25 mg twice daily as needed for  dizziness Avoid sudden positional changes Maintain adequate hydration and eat at regular intervals Advised to perform vestibular exercises as tolerated      Relevant Medications   meclizine  (ANTIVERT ) 25 MG tablet   Other Relevant Orders   CBC (Completed)   Other Visit Diagnoses       Nausea       Relevant Medications   ondansetron  (ZOFRAN -ODT) 4 MG disintegrating tablet   Other Relevant Orders   CBC (Completed)         Meds ordered this encounter  Medications   meclizine  (ANTIVERT ) 25 MG tablet    Sig: Take 1 tablet (25 mg total) by mouth 2 (two) times daily as needed for dizziness.    Dispense:  30 tablet    Refill:  1   ondansetron  (ZOFRAN -ODT) 4 MG disintegrating tablet    Sig: Take 1 tablet (4 mg total) by mouth every 8 (eight) hours as needed for nausea or vomiting.    Dispense:  20 tablet  Refill:  0   LORazepam  (ATIVAN ) 1 MG tablet    Sig: Take 1 tablet (1 mg total) by mouth every 8 (eight) hours as needed for anxiety.    Dispense:  90 tablet    Refill:  3    Follow-up: Return in about 4 months (around 03/27/2024) for GAD.    Meldon Sport, MD

## 2023-11-26 NOTE — Assessment & Plan Note (Signed)
 Well-controlled On Lorazepam  1 mg TID, has been on Benzodiazepine for more than 15 years - refilled, PDMP reviewed Did not tolerate Wellbutrin in the past with her previous PCP Did not like Buspar 

## 2023-11-26 NOTE — Assessment & Plan Note (Signed)
 Bilateral hand pain and stiffness - has Heberden and Bouchard nodes bilaterally Tylenol  as needed for pain Voltaren gel as needed Continue simple hand exercises

## 2023-11-27 ENCOUNTER — Telehealth: Payer: Self-pay

## 2023-11-27 ENCOUNTER — Ambulatory Visit: Payer: Self-pay | Admitting: Internal Medicine

## 2023-11-27 LAB — LIPID PANEL
Chol/HDL Ratio: 2.8 ratio (ref 0.0–4.4)
Cholesterol, Total: 214 mg/dL — ABNORMAL HIGH (ref 100–199)
HDL: 77 mg/dL (ref >39)
LDL Chol Calc (NIH): 123 mg/dL — ABNORMAL HIGH (ref 0–99)
Triglycerides: 79 mg/dL (ref 0–149)
VLDL Cholesterol Cal: 14 mg/dL (ref 5–40)

## 2023-11-27 LAB — CMP14+EGFR
ALT: 19 IU/L (ref 0–32)
AST: 27 IU/L (ref 0–40)
Albumin: 4.4 g/dL (ref 3.9–4.9)
Alkaline Phosphatase: 83 IU/L (ref 44–121)
BUN/Creatinine Ratio: 23 (ref 12–28)
BUN: 21 mg/dL (ref 8–27)
Bilirubin Total: 0.3 mg/dL (ref 0.0–1.2)
CO2: 21 mmol/L (ref 20–29)
Calcium: 9.8 mg/dL (ref 8.7–10.3)
Chloride: 105 mmol/L (ref 96–106)
Creatinine, Ser: 0.92 mg/dL (ref 0.57–1.00)
Globulin, Total: 2.6 g/dL (ref 1.5–4.5)
Glucose: 105 mg/dL — ABNORMAL HIGH (ref 70–99)
Potassium: 4.2 mmol/L (ref 3.5–5.2)
Sodium: 142 mmol/L (ref 134–144)
Total Protein: 7 g/dL (ref 6.0–8.5)
eGFR: 67 mL/min/{1.73_m2} (ref >59)

## 2023-11-27 LAB — CBC
Hematocrit: 38.1 % (ref 34.0–46.6)
Hemoglobin: 12.5 g/dL (ref 11.1–15.9)
MCH: 30.9 pg (ref 26.6–33.0)
MCHC: 32.8 g/dL (ref 31.5–35.7)
MCV: 94 fL (ref 79–97)
Platelets: 288 10*3/uL (ref 150–450)
RBC: 4.04 x10E6/uL (ref 3.77–5.28)
RDW: 12.5 % (ref 11.7–15.4)
WBC: 6.4 10*3/uL (ref 3.4–10.8)

## 2023-11-27 LAB — ANA W/REFLEX

## 2023-11-27 LAB — C-REACTIVE PROTEIN: CRP: 3 mg/L (ref 0–10)

## 2023-11-27 LAB — RHEUMATOID FACTOR: Rheumatoid fact SerPl-aCnc: 113.9 [IU]/mL — ABNORMAL HIGH (ref <14.0)

## 2023-11-27 NOTE — Telephone Encounter (Signed)
 Second attempt to go over labs.

## 2023-11-27 NOTE — Addendum Note (Signed)
 Addended byCleola Dach on: 11/27/2023 08:05 AM   Modules accepted: Orders

## 2023-11-27 NOTE — Telephone Encounter (Signed)
 Copied from CRM [id number]. Topic: Clinical - Lab/Test Results >> Nov 27, 2023  1:25 PM Felizardo Hotter wrote: Reason for CRM: Pt returning [facility] call for lab results which were provided. Pt did ask that referral for rheumatology is sent to [facility] if possible. Please call [phone] pt to let her know.

## 2023-11-28 NOTE — Telephone Encounter (Signed)
 Third attempt please refer to lab note

## 2023-12-03 ENCOUNTER — Encounter (HOSPITAL_COMMUNITY): Payer: Self-pay | Admitting: Internal Medicine

## 2024-01-06 ENCOUNTER — Ambulatory Visit (INDEPENDENT_AMBULATORY_CARE_PROVIDER_SITE_OTHER)

## 2024-01-06 VITALS — Ht 67.0 in | Wt 115.0 lb

## 2024-01-06 DIAGNOSIS — Z0000 Encounter for general adult medical examination without abnormal findings: Secondary | ICD-10-CM | POA: Diagnosis not present

## 2024-01-06 DIAGNOSIS — Z780 Asymptomatic menopausal state: Secondary | ICD-10-CM

## 2024-01-06 DIAGNOSIS — Z1231 Encounter for screening mammogram for malignant neoplasm of breast: Secondary | ICD-10-CM

## 2024-01-06 NOTE — Patient Instructions (Signed)
 [name] ,  Thank you for taking time out of your busy schedule to complete your Annual Wellness Visit with me. I enjoyed our conversation and look forward to speaking with you again next year. I, as well as your care team,  appreciate your ongoing commitment to your health goals. Please review the following plan we discussed and let me know if I can assist you in the future.  I enjoyed our conversation and look forward to it again next year. Blessing for the upcoming year!!  -Jaleena Viviani     TOGETHER, WE CAME UP WITH THE FOLLOWING GAME PLAN!  Referrals/Orders/Labs Placed Today: Osteoporosis Screening/Yearly Mammogram: Please call the number below to schedule your appt. [facility] Phone: [phone]  Follow up Visits Next Visit with PCP:04/01/2024 at 1:20 pm Next Medicare Wellness w/ Health Advisor (1 year):01/10/2025 at 1:10 pm video visit Clinician Recommendations:  Aim for 30 minutes of exercise or brisk walking, 6-8 glasses of water , and 5 servings of fruits and vegetables each day.       This is a list of the screening recommended for you and due dates:  Health Maintenance  Topic Date Due   DTaP/Tdap/Td vaccine (1 - Tdap) Never done   Zoster (Shingles) Vaccine (2 of 2) 10/21/2021   COVID-19 Vaccine (3 - 2024-25 season) 02/16/2023   Flu Shot  01/16/2024   Medicare Annual Wellness Visit  01/04/2025   DEXA scan (bone density measurement)  10/12/2025   Pneumococcal Vaccine for age over 70  Completed   Hepatitis B Vaccine  Aged Out   HPV Vaccine  Aged Out   Meningitis B Vaccine  Aged Out   Hepatitis C Screening  Discontinued    Advanced directives: (Declined) Advance directive discussed with you today. Even though you declined this today, please call our office should you change your mind, and we can give you the proper paperwork for you to fill out. Advance Care Planning is important because it:  [x]  Makes sure you receive the medical care that is consistent  with your values, goals, and preferences  [x]  It provides guidance to your family and loved ones and reduces their decisional burden about whether or not they are making the right decisions based on your wishes.  Follow the link provided in your after visit summary or read over the paperwork we have mailed to you to help you started getting your Advance Directives in place. If you need assistance in completing these, please reach out to us  so that we can help you!  Follow the link provided in your after visit summary or read over the paperwork we have mailed to you to help you started getting your Advance Directives in place. If you need assistance in completing these, please reach out to us  so that we can help you!  We will mail you an Advanced Directives packet as we discussed during your visit today. You do not have to have an attorney to complete this paperwork. Read over the packet, discuss it with family, and complete it. Choose who will be making decisions for you in the event you can no longer make them for yourself. Once completed have them notarized, and bring the packet back to the office. We will scan it and make sure it gets into your chart.   If you choose to have a DNR (Do Not Resuscitate Order) you must get this from your primary care doctor because they have to sign it. You can get  this in the office during an appointment.   THIS ORDER MUST BE VISIBLE AT ALL TIMES WITHIN YOUR HOME SUCH AS ON A REFRIGERATOR.

## 2024-01-06 NOTE — Progress Notes (Signed)
 Subjective:   Allison Dickerson is a 68 y.o. who presents for a Medicare Wellness preventive visit.  As a reminder, Annual Wellness Visits don't include a physical exam, and some assessments may be limited, especially if this visit is performed virtually. We may recommend an in-person follow-up visit with your provider if needed.  Visit Complete: Virtual I connected with  Allison Dickerson on 01/06/24 by a audio enabled telemedicine application and verified that I am speaking with the correct person using two identifiers.  Patient Location: Home  Provider Location: Home Office  I discussed the limitations of evaluation and management by telemedicine. The patient expressed understanding and agreed to proceed.  Vital Signs: Because this visit was a virtual/telehealth visit, some criteria may be missing or patient reported. Any vitals not documented were not able to be obtained and vitals that have been documented are patient reported.  VideoDeclined- This patient declined Librarian, academic. Therefore the visit was completed with audio only.  Persons Participating in Visit: Patient.  AWV Questionnaire: No: Patient Medicare AWV questionnaire was not completed prior to this visit.  Cardiac Risk Factors include: advanced age (>49men, >59 women);sedentary lifestyle     Objective:    Today's Vitals   01/06/24 1448 01/06/24 1449  Weight: 115 lb (52.2 kg)   Height: 5' 7 (1.702 m)   PainSc:  5    Body mass index is 18.01 kg/m.     01/06/2024    2:48 PM 08/04/2023   11:24 AM 09/02/2022   11:04 AM 08/12/2022    6:34 PM 04/17/2021   10:42 AM 02/06/2021   10:55 AM 10/24/2020    7:45 AM  Advanced Directives  Does Patient Have a Medical Advance Directive? No No Yes No Yes No   Type of Advance Directive   Living will;Healthcare Power of [employer] Power of Attorney    Does patient want to make changes to medical advance directive?   No - Patient  declined  Yes (MAU/Ambulatory/Procedural Areas - Information given)    Copy of Healthcare Power of Attorney in Chart?   No - copy requested  No - copy requested    Would patient like information on creating a medical advance directive? No - Patient declined    Yes (MAU/Ambulatory/Procedural Areas - Information given) No - Patient declined No - Patient declined    Current Medications (verified) Outpatient Encounter Medications as of 01/06/2024  Medication Sig   ibuprofen  (ADVIL ) 800 MG tablet Take 800 mg by mouth.   LORazepam  (ATIVAN ) 1 MG tablet Take 1 tablet (1 mg total) by mouth every 8 (eight) hours as needed for anxiety.   meclizine  (ANTIVERT ) 25 MG tablet Take 1 tablet (25 mg total) by mouth 2 (two) times daily as needed for dizziness.   Multiple Vitamin (MULTIVITAMIN WITH MINERALS) TABS tablet Take 1 tablet by mouth daily.   ondansetron  (ZOFRAN -ODT) 4 MG disintegrating tablet Take 1 tablet (4 mg total) by mouth every 8 (eight) hours as needed for nausea or vomiting.   No facility-administered encounter medications on file as of 01/06/2024.    Allergies (verified) Patient has no known allergies.   History: Past Medical History:  Diagnosis Date   Anxiety    Arthritis    Phreesia 06/24/2020   Cervical cancer (HCC)    When younger.   Diverticulitis    GERD (gastroesophageal reflux disease)    Glaucoma    Pinched nerve    right hand   Past Surgical  History:  Procedure Laterality Date   ABDOMINAL HYSTERECTOMY     BIOPSY  10/24/2020   Procedure: BIOPSY;  Surgeon: Eartha Angelia Sieving, MD;  [facility];  Service: Gastroenterology;;   cervical cancer removal     COLONOSCOPY WITH PROPOFOL  N/A 10/24/2020   Procedure: COLONOSCOPY WITH PROPOFOL ;  Surgeon: Eartha Angelia Sieving, MD;  Location: [facility];  Service: Gastroenterology;  Laterality: N/A;  9:00 am   ESOPHAGOGASTRODUODENOSCOPY (EGD) WITH PROPOFOL  N/A 10/24/2020   Procedure:  ESOPHAGOGASTRODUODENOSCOPY (EGD) WITH PROPOFOL ;  Surgeon: Eartha Angelia Sieving, MD;  Location: [facility];  Service: Gastroenterology;  Laterality: N/A;   EYE SURGERY N/A    Phreesia 06/24/2020   HAND SURGERY Right    POLYPECTOMY  10/24/2020   Procedure: POLYPECTOMY;  Surgeon: Eartha Angelia, Sieving, MD;  Location: [facility];  Service: Gastroenterology;;   Family History  Problem Relation Age of Onset   Cancer Father    Alcohol abuse Sister    Cancer Other    Social History   Socioeconomic History   Marital status: Married    Spouse name: Not on file   Number of children: Not on file   Years of education: Not on file   Highest education level: Associate degree: occupational, Scientist, product/process development, or vocational program  Occupational History   Occupation: retired  Tobacco Use   Smoking status: Former    Current packs/day: 0.50    Average packs/day: 0.5 packs/day for 20.0 years (10.0 ttl pk-yrs)    Types: Cigarettes   Smokeless tobacco: Never  Vaping Use   Vaping status: Never Used  Substance and Sexual Activity   Alcohol use: No   Drug use: Yes    Types: Marijuana    Comment: occ with gummies   Sexual activity: Yes    Birth control/protection: Surgical    Comment: hyst  Other Topics Concern   Not on file  Social History Narrative   Not on file   Social Drivers of Health   Financial Resource Strain: Low Risk  (01/06/2024)   Overall Financial Resource Strain (CARDIA)    Difficulty of Paying Living Expenses: Not hard at all  Food Insecurity: No Food Insecurity (01/06/2024)   Hunger Vital Sign    Worried About Running Out of Food in the Last Year: Never true    Ran Out of Food in the Last Year: Never true  Transportation Needs: No Transportation Needs (01/06/2024)   PRAPARE - Administrator, Civil Service (Medical): No    Lack of Transportation (Non-Medical): No  Physical Activity: Inactive (01/06/2024)   Exercise Vital Sign    Days of Exercise per  Week: 0 days    Minutes of Exercise per Session: 0 min  Stress: No Stress Concern Present (01/06/2024)   [employer] of Occupational Health - Occupational Stress Questionnaire    Feeling of Stress: Not at all  Social Connections: Socially Isolated (01/06/2024)   Social Connection and Isolation Panel    Frequency of Communication with Friends and Family: Once a week    Frequency of Social Gatherings with Friends and Family: Once a week    Attends Religious Services: Never    Database administrator or Organizations: No    Attends Engineer, structural: Never    Marital Status: Married    Tobacco Counseling Counseling given: Yes    Clinical Intake:  Pre-visit preparation completed: Yes  Pain : 0-10 Pain Score: 5  Pain Type: Chronic pain Pain Location: Hand  Pain Orientation: Right, Left Pain Descriptors / Indicators: Constant Pain Onset: More than a month ago Pain Frequency: Constant     BMI - recorded: 18.01 Nutritional Risks: None Diabetes: No  Lab Results  Component Value Date   HGBA1C 5.7 (H) 10/22/2022   HGBA1C 5.8 (H) 02/13/2022   HGBA1C 5.7 (H) 04/17/2021     How often do you need to have someone help you when you read instructions, pamphlets, or other written materials from your doctor or pharmacy?: 1 - Never  Interpreter Needed?: No  Information entered by :: A Abbagail Scaff CMA-AAMA   Activities of Daily Living     01/06/2024    3:00 PM  In your present state of health, do you have any difficulty performing the following activities:  Hearing? 0  Vision? 0  Difficulty concentrating or making decisions? 0  Walking or climbing stairs? 0  Dressing or bathing? 1  Comment sometimes due to chronic hand pain/ history of RT hand surgery  Doing errands, shopping? 0  Preparing Food and eating ? N  Comment due to RT hand pain  Using the Toilet? N  In the past six months, have you accidently leaked urine? N  Do you have problems with loss of bowel  control? N  Managing your Medications? N  Managing your Finances? N  Housekeeping or managing your Housekeeping? Y  Comment due to RT hand pain    Patient Care Team: Tobie Suzzane POUR, MD as PCP - General (Internal Medicine) Sherrilee Belvie CROME, MD as Consulting Physician (Urology) Sissy Cough, MD (Orthopedic Surgery) Darroll Anes, DO (Optometry) [facility], [facility]  (Optometry)  I have updated your Care Teams any recent Medical Services you may have received from other providers in the past year.     Assessment:   This is a routine wellness examination for [facility].  Hearing/Vision screen Hearing Screening - Comments:: Patient denies any hearing difficulties.   Vision Screening - Comments:: Wears rx glasses - up to date with routine eye exams with  Anes Darroll w/ [facility] [place] location   Goals Addressed             This Visit's Progress    Patient Stated       Patient stated I'd like to get my hands to feeling better.        Depression Screen     01/06/2024    3:04 PM 11/26/2023    1:06 PM 07/24/2023    1:04 PM 07/24/2023    1:00 PM 04/22/2023    1:18 PM 01/22/2023    1:23 PM 10/22/2022    1:56 PM  PHQ 2/9 Scores  PHQ - 2 Score 0 0 0 0 0 0 0  PHQ- 9 Score 0 0 0 0       Fall Risk     01/06/2024    2:59 PM 11/26/2023    1:06 PM 07/24/2023    1:04 PM 07/24/2023    1:00 PM 04/22/2023    1:18 PM  Fall Risk   Falls in the past year? 0 0 0 0 0  Number falls in past yr: 0 0 0 0 0  Injury with Fall? 0 0 0 0 0  Risk for fall due to : No Fall Risks No Fall Risks No Fall Risks No Fall Risks No Fall Risks  Follow up Falls evaluation completed;Education provided;Falls prevention discussed Falls evaluation completed Falls evaluation completed Falls evaluation completed Falls evaluation completed  MEDICARE RISK AT HOME:  Medicare Risk at Home Any stairs in or around the home?: Yes If so, are there any without handrails?: No Home free of  loose throw rugs in walkways, pet beds, electrical cords, etc?: Yes Life alert?: No Use of a cane, walker or w/c?: No Grab bars in the bathroom?: No Shower chair or Dickerson in shower?: No Elevated toilet seat or a handicapped toilet?: No  TIMED UP AND GO:  Was the test performed?  No  Cognitive Function: 6CIT completed    09/02/2022   11:05 AM 04/17/2021   10:44 AM  MMSE - Mini Mental State Exam  Not completed: Unable to complete Unable to complete        01/06/2024    3:01 PM 09/02/2022   11:05 AM 04/17/2021   10:49 AM 04/17/2021   10:44 AM  6CIT Screen  What Year? 0 points 0 points 0 points 0 points  What month? 0 points 0 points 0 points 0 points  What time? 0 points 0 points 0 points 0 points  Count back from 20 0 points 0 points 0 points 0 points  Months in reverse 0 points 0 points 2 points 0 points  Repeat phrase 0 points 0 points 0 points 4 points  Total Score 0 points 0 points 2 points 4 points    Immunizations Immunization History  Administered Date(s) Administered   Pneumococcal Polysaccharide-23 02/19/2017    Screening Tests Health Maintenance  Topic Date Due   DEXA SCAN  Never done   COVID-19 Vaccine (1) Never done   MAMMOGRAM  Never done   DTaP/Tdap/Td (1 - Tdap) Never done   Zoster Vaccines- Shingrix (1 of 2) Never done   Pneumococcal Vaccine: 50+ Years (2 of 2 - PCV) 02/19/2018   INFLUENZA VACCINE  01/16/2024   Medicare Annual Wellness (AWV)  01/05/2025   Colonoscopy  10/24/2025   Hepatitis C Screening  Completed   Hepatitis B Vaccines  Aged Out   HPV VACCINES  Aged Out   Meningococcal B Vaccine  Aged Out    Health Maintenance  Health Maintenance Due  Topic Date Due   DEXA SCAN  Never done   COVID-19 Vaccine (1) Never done   MAMMOGRAM  Never done   DTaP/Tdap/Td (1 - Tdap) Never done   Zoster Vaccines- Shingrix (1 of 2) Never done   Pneumococcal Vaccine: 50+ Years (2 of 2 - PCV) 02/19/2018   Health Maintenance Items Addressed: Mammogram  ordered, DEXA ordered  Additional Screening:  Vision Screening: Recommended annual ophthalmology exams for early detection of glaucoma and other disorders of the eye. Would you like a referral to an eye doctor? No    Dental Screening: Recommended annual dental exams for proper oral hygiene  Community Resource Referral / Chronic Care Management: CRR required this visit?  No   CCM required this visit?  No   Plan:    I have personally reviewed and noted the following in the patient's chart:   Medical and social history Use of alcohol, tobacco or illicit drugs  Current medications and supplements including opioid prescriptions. Patient is not currently taking opioid prescriptions. Functional ability and status Nutritional status Physical activity Advanced directives List of other physicians Hospitalizations, surgeries, and ER visits in previous 12 months Vitals Screenings to include cognitive, depression, and falls Referrals and appointments  In addition, I have reviewed and discussed with patient certain preventive protocols, quality metrics, and best practice recommendations. A written personalized care plan for preventive  services as well as general preventive health recommendations were provided to patient.   Havah Ammon, CMA   01/06/2024   After Visit Summary: (MyChart) Due to this being a telephonic visit, the after visit summary with patients personalized plan was offered to patient via MyChart   Notes: Please refer to Routing Comments.

## 2024-02-06 ENCOUNTER — Encounter: Payer: Self-pay | Admitting: Radiology

## 2024-04-01 ENCOUNTER — Ambulatory Visit: Admitting: Internal Medicine

## 2024-04-01 ENCOUNTER — Encounter: Payer: Self-pay | Admitting: Internal Medicine

## 2024-04-01 VITALS — BP 104/67 | HR 91 | Ht 67.0 in | Wt 116.0 lb

## 2024-04-01 DIAGNOSIS — K580 Irritable bowel syndrome with diarrhea: Secondary | ICD-10-CM | POA: Diagnosis not present

## 2024-04-01 DIAGNOSIS — M19041 Primary osteoarthritis, right hand: Secondary | ICD-10-CM

## 2024-04-01 DIAGNOSIS — F411 Generalized anxiety disorder: Secondary | ICD-10-CM | POA: Diagnosis not present

## 2024-04-01 DIAGNOSIS — M0580 Other rheumatoid arthritis with rheumatoid factor of unspecified site: Secondary | ICD-10-CM | POA: Diagnosis not present

## 2024-04-01 DIAGNOSIS — Z79899 Other long term (current) drug therapy: Secondary | ICD-10-CM

## 2024-04-01 DIAGNOSIS — M19042 Primary osteoarthritis, left hand: Secondary | ICD-10-CM

## 2024-04-01 DIAGNOSIS — E782 Mixed hyperlipidemia: Secondary | ICD-10-CM

## 2024-04-01 MED ORDER — CELECOXIB 200 MG PO CAPS: 200.0000 mg | ORAL_CAPSULE | Freq: Two times a day (BID) | ORAL | 3 refills | Status: AC

## 2024-04-01 MED ORDER — LORAZEPAM 1 MG PO TABS: 1.0000 mg | ORAL_TABLET | Freq: Three times a day (TID) | ORAL | 3 refills | Status: AC | PRN

## 2024-04-01 NOTE — Assessment & Plan Note (Signed)
For severe GAD Check Toxassure in the next visit

## 2024-04-01 NOTE — Assessment & Plan Note (Signed)
 Has bilateral hand, right wrist and bilateral shoulder pain Checked CBC, ANA, RF, CRP RF elevated - had referred to rheumatology, but she has not scheduled appt yet, contact information provided

## 2024-04-01 NOTE — Assessment & Plan Note (Signed)
Now resolved Has tried Bentyl and Levsin, did not provide relief Takes ginger now

## 2024-04-01 NOTE — Assessment & Plan Note (Signed)
 Checked lipid profile Does not have a history of HTN, type II DM, CVA or CAD Advised to follow low cholesterol diet for now

## 2024-04-01 NOTE — Patient Instructions (Addendum)
 Please start taking Celecoxib as needed for hand pain.  Please call [phone] to schedule appointment with [facility].  Please continue to take medications as prescribed.  Please continue to follow low salt diet and perform moderate exercise/walking as tolerated.

## 2024-04-01 NOTE — Assessment & Plan Note (Addendum)
 Bilateral hand pain and stiffness - has [place] and [place] nodes bilaterally Celebrex as needed for pain, can alternate with Tylenol  arthritis Voltaren gel as needed Continue simple hand exercises

## 2024-04-01 NOTE — Assessment & Plan Note (Signed)
 Well-controlled On Lorazepam  1 mg TID, has been on Benzodiazepine for more than 15 years - refilled, PDMP reviewed Did not tolerate Wellbutrin in the past with her previous PCP Did not like Buspar 

## 2024-04-01 NOTE — Progress Notes (Signed)
 Established Patient Office Visit  Subjective:  Patient ID: Allison Dickerson, female    DOB: March 23, 1956  Age: 67 y.o. MRN: [id number]  CC:  Chief Complaint  Patient presents with   Anxiety    Four month follow up    Arthritis    Discuss options    HPI Allison Dickerson is a 67 y.o. female with past medical history of GERD, polyarthritis, severe anxiety and pulmonary nodule who presents for f/u of her chronic medical conditions.  GAD: She takes Ativan  1 mg 3 times daily for anxiety.  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 She has had difficulty concentrating and panic episodes while trying to taper the dose. Denies anhedonia, SI or HI currently.  She reports bilateral hand pain, has nodules over small joints.  She has stiffness of bilateral hands, lasting for more than 30 minutes.  She has tried taking Tylenol , ibuprofen  and has used topical agents including Voltaren, IcyHot with inadequate relief.  Her rheumatoid factor level was elevated in the last visit, was referred to rheumatology, but rheumatology office was not able to contact her to schedule her appointment.  She has chronic right hand pain. She had a ganglion cyst on the right hand, for which she saw Hand specialist.  She also has bilateral carpal tunnel syndrome. She is status post right carpal tunnel release with right small and ring finger extensor reconstructions with tendon transfers and right distal ulnar resection with ECU tendon transfer. She underwent this surgery by Dr. Donnice Robinsons on 08/28/2022.  She wants to talk about her preventive tests later, agrees to get Mammography, DEXA scan, still denies vaccines.   Past Medical History:  Diagnosis Date   Anxiety    Arthritis    Phreesia 06/24/2020   Cervical cancer (HCC)    When younger.   Diverticulitis    GERD (gastroesophageal reflux disease)    Glaucoma    Pinched  nerve    right hand    Past Surgical History:  Procedure Laterality Date   ABDOMINAL HYSTERECTOMY     BIOPSY  10/24/2020   Procedure: BIOPSY;  Surgeon: Eartha Angelia Sieving, MD;  Location: [facility];  Service: Gastroenterology;;   cervical cancer removal     COLONOSCOPY WITH PROPOFOL  N/A 10/24/2020   Procedure: COLONOSCOPY WITH PROPOFOL ;  Surgeon: Eartha Angelia Sieving, MD;  Location: [facility];  Service: Gastroenterology;  Laterality: N/A;  9:00 am   ESOPHAGOGASTRODUODENOSCOPY (EGD) WITH PROPOFOL  N/A 10/24/2020   Procedure: ESOPHAGOGASTRODUODENOSCOPY (EGD) WITH PROPOFOL ;  Surgeon: Eartha Angelia Sieving, MD;  Location: [facility];  Service: Gastroenterology;  Laterality: N/A;   EYE SURGERY N/A    Phreesia 06/24/2020   HAND SURGERY Right    POLYPECTOMY  10/24/2020   Procedure: POLYPECTOMY;  Surgeon: Eartha Angelia Sieving, MD;  Location: [facility];  Service: Gastroenterology;;    Family History  Problem Relation Age of Onset   Cancer Father    Alcohol abuse Sister    Cancer Other     Social History   Socioeconomic History   Marital status: Married    Spouse name: Not on file   Number of children: Not on file   Years of education: Not on file   Highest education level: Associate degree: occupational, Scientist, product/process development, or vocational program  Occupational History   Occupation: retired  Tobacco Use   Smoking status: Former    Current  packs/day: 0.50    Average packs/day: 0.5 packs/day for 20.0 years (10.0 ttl pk-yrs)    Types: Cigarettes   Smokeless tobacco: Never  Vaping Use   Vaping status: Never Used  Substance and Sexual Activity   Alcohol use: No   Drug use: Yes    Types: Marijuana    Comment: occ with gummies   Sexual activity: Yes    Birth control/protection: Surgical    Comment: hyst  Other Topics Concern   Not on file  Social History Narrative   Not on file   Social Drivers of Health   Financial Resource Strain: Low Risk   (01/06/2024)   Overall Financial Resource Strain (CARDIA)    Difficulty of Paying Living Expenses: Not hard at all  Food Insecurity: No Food Insecurity (01/06/2024)   Hunger Vital Sign    Worried About Running Out of Food in the Last Year: Never true    Ran Out of Food in the Last Year: Never true  Transportation Needs: No Transportation Needs (01/06/2024)   PRAPARE - Administrator, Civil Service (Medical): No    Lack of Transportation (Non-Medical): No  Physical Activity: Inactive (01/06/2024)   Exercise Vital Sign    Days of Exercise per Week: 0 days    Minutes of Exercise per Session: 0 min  Stress: No Stress Concern Present (01/06/2024)   [employer] of Occupational Health - Occupational Stress Questionnaire    Feeling of Stress: Not at all  Social Connections: Socially Isolated (01/06/2024)   Social Connection and Isolation Panel    Frequency of Communication with Friends and Family: Once a week    Frequency of Social Gatherings with Friends and Family: Once a week    Attends Religious Services: Never    Database administrator or Organizations: No    Attends Banker Meetings: Never    Marital Status: Married  Catering manager Violence: Not At Risk (01/06/2024)   Humiliation, Afraid, Rape, and Kick questionnaire    Fear of Current or Ex-Partner: No    Emotionally Abused: No    Physically Abused: No    Sexually Abused: No    Outpatient Medications Prior to Visit  Medication Sig Dispense Refill   meclizine  (ANTIVERT ) 25 MG tablet Take 1 tablet (25 mg total) by mouth 2 (two) times daily as needed for dizziness. 30 tablet 1   Multiple Vitamin (MULTIVITAMIN WITH MINERALS) TABS tablet Take 1 tablet by mouth daily.     ondansetron  (ZOFRAN -ODT) 4 MG disintegrating tablet Take 1 tablet (4 mg total) by mouth every 8 (eight) hours as needed for nausea or vomiting. 20 tablet 0   ibuprofen  (ADVIL ) 800 MG tablet Take 800 mg by mouth.     LORazepam  (ATIVAN ) 1 MG  tablet Take 1 tablet (1 mg total) by mouth every 8 (eight) hours as needed for anxiety. 90 tablet 3   No facility-administered medications prior to visit.    No Known Allergies  ROS Review of Systems  Constitutional:  Negative for chills and fever.  HENT:  Negative for congestion, sinus pressure, sinus pain and sore throat.   Eyes:  Negative for pain and discharge.  Respiratory:  Negative for cough and shortness of breath.   Cardiovascular:  Negative for chest pain and palpitations.  Gastrointestinal:  Positive for nausea. Negative for abdominal pain, constipation and vomiting.  Endocrine: Negative for polydipsia and polyuria.  Genitourinary:  Negative for dysuria and hematuria.  Musculoskeletal:  Positive for arthralgias (Right  wrist, b/l hands). Negative for neck pain and neck stiffness.  Skin:  Negative for rash.  Neurological:  Positive for dizziness and numbness (Right hand). Negative for speech difficulty and weakness.  Psychiatric/Behavioral:  Positive for sleep disturbance. Negative for agitation, behavioral problems, dysphoric mood and suicidal ideas. The patient is nervous/anxious.       Objective:    Physical Exam Vitals reviewed.  Constitutional:      General: She is not in acute distress.    Appearance: She is not diaphoretic.  HENT:     Head: Normocephalic and atraumatic.     Nose: Nose normal.     Mouth/Throat:     Mouth: Mucous membranes are moist.  Eyes:     General: No scleral icterus.    Extraocular Movements: Extraocular movements intact.  Cardiovascular:     Rate and Rhythm: Normal rate and regular rhythm.     Heart sounds: Normal heart sounds. No murmur heard. Pulmonary:     Breath sounds: Normal breath sounds. No wheezing or rales.  Musculoskeletal:     Right hand: Deformity and bony tenderness present.     Left hand: Deformity and bony tenderness present.     Cervical back: Neck supple. No tenderness.     Right knee: Swelling present. Decreased  range of motion.     Left knee: Swelling present. Decreased range of motion.     Right lower leg: No edema.     Left lower leg: No edema.     Comments: Unable to make fist on right hand - Heberden and Bouchard nodes b/l  Skin:    General: Skin is warm.     Findings: No rash.  Neurological:     General: No focal deficit present.     Mental Status: She is alert and oriented to person, place, and time.     Sensory: No sensory deficit.     Motor: No weakness.  Psychiatric:        Mood and Affect: Mood is anxious.        Behavior: Behavior normal.     BP 104/67   Pulse 91   Ht 5' 7 (1.702 m)   Wt 116 lb (52.6 kg)   SpO2 98%   BMI 18.17 kg/m  Wt Readings from Last 3 Encounters:  04/01/24 116 lb (52.6 kg)  01/06/24 115 lb (52.2 kg)  11/26/23 115 lb (52.2 kg)    Lab Results  Component Value Date   TSH 1.470 10/22/2022   Lab Results  Component Value Date   WBC 6.4 11/26/2023   HGB 12.5 11/26/2023   HCT 38.1 11/26/2023   MCV 94 11/26/2023   PLT 288 11/26/2023   Lab Results  Component Value Date   NA 142 11/26/2023   K 4.2 11/26/2023   CO2 21 11/26/2023   GLUCOSE 105 (H) 11/26/2023   BUN 21 11/26/2023   CREATININE 0.92 11/26/2023   BILITOT 0.3 11/26/2023   ALKPHOS 83 11/26/2023   AST 27 11/26/2023   ALT 19 11/26/2023   PROT 7.0 11/26/2023   ALBUMIN 4.4 11/26/2023   CALCIUM  9.8 11/26/2023   ANIONGAP 11 08/04/2023   EGFR 67 11/26/2023   Lab Results  Component Value Date   CHOL 214 (H) 11/26/2023   Lab Results  Component Value Date   HDL 77 11/26/2023   Lab Results  Component Value Date   LDLCALC 123 (H) 11/26/2023   Lab Results  Component Value Date   TRIG 79 11/26/2023  Lab Results  Component Value Date   CHOLHDL 2.8 11/26/2023   Lab Results  Component Value Date   HGBA1C 5.7 (H) 10/22/2022      Assessment & Plan:   Problem List Items Addressed This Visit       Digestive   IBS (irritable bowel syndrome)   Now resolved Has tried  Bentyl  and Levsin , did not provide relief Takes ginger now        Musculoskeletal and Integument   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   Has bilateral hand, right wrist and bilateral shoulder pain Checked CBC, ANA, RF, CRP RF elevated - had referred to rheumatology, but she has not scheduled appt yet, contact information provided      Relevant Medications   celecoxib (CELEBREX) 200 MG capsule   Primary osteoarthritis of both hands   Bilateral hand pain and stiffness - has Heberden and Bouchard nodes bilaterally Celebrex as needed for pain, can alternate with Tylenol  arthritis Voltaren gel as needed Continue simple hand exercises      Relevant Medications   celecoxib (CELEBREX) 200 MG capsule     Other   GAD (generalized anxiety disorder) (Chronic)   Well-controlled On Lorazepam  1 mg TID, has been on Benzodiazepine for more than 15 years - refilled, PDMP reviewed Did not tolerate Wellbutrin in the past with her previous PCP Did not like Buspar       Relevant Medications   LORazepam  (ATIVAN ) 1 MG tablet   Mixed hyperlipidemia   Checked lipid profile Does not have a history of HTN, type II DM, CVA or CAD Advised to follow low cholesterol diet for now      Chronic prescription benzodiazepine use   For severe GAD Check Toxassure in the next visit          Meds ordered this encounter  Medications   LORazepam  (ATIVAN ) 1 MG tablet    Sig: Take 1 tablet (1 mg total) by mouth every 8 (eight) hours as needed for anxiety.    Dispense:  90 tablet    Refill:  3   celecoxib (CELEBREX) 200 MG capsule    Sig: Take 1 capsule (200 mg total) by mouth 2 (two) times daily.    Dispense:  60 capsule    Refill:  3    Follow-up: Return in about 4 months (around 08/02/2024) for GAD.    Suzzane MARLA Blanch, MD

## 2024-04-19 ENCOUNTER — Encounter: Payer: Self-pay | Admitting: Radiology

## 2024-08-04 ENCOUNTER — Ambulatory Visit: Admitting: Internal Medicine

## 2025-01-10 ENCOUNTER — Ambulatory Visit
